# Patient Record
Sex: Female | Born: 1980 | Hispanic: No | Marital: Married | Smoking: Former smoker
Health system: Southern US, Academic
[De-identification: ages and names within clinical notes are randomized; demographics above are authoritative.]

## PROBLEM LIST (undated history)

## (undated) DIAGNOSIS — Z87898 Personal history of other specified conditions: Secondary | ICD-10-CM

## (undated) DIAGNOSIS — G43909 Migraine, unspecified, not intractable, without status migrainosus: Secondary | ICD-10-CM

## (undated) DIAGNOSIS — S3992XA Unspecified injury of lower back, initial encounter: Secondary | ICD-10-CM

## (undated) HISTORY — DX: Unspecified injury of lower back, initial encounter: S39.92XA

## (undated) HISTORY — DX: Personal history of other specified conditions: Z87.898

## (undated) HISTORY — DX: Migraine, unspecified, not intractable, without status migrainosus: G43.909

## (undated) HISTORY — PX: HX COLPOSCOPY: SHX161

---

## 2007-01-08 HISTORY — PX: HX ELECTIVE ABORTION: 2100001500

## 2013-01-07 DIAGNOSIS — L400 Psoriasis vulgaris: Secondary | ICD-10-CM

## 2013-01-07 HISTORY — DX: Psoriasis vulgaris: L40.0

## 2014-12-05 ENCOUNTER — Ambulatory Visit (INDEPENDENT_AMBULATORY_CARE_PROVIDER_SITE_OTHER): Payer: BC Managed Care – PPO

## 2014-12-05 ENCOUNTER — Encounter (INDEPENDENT_AMBULATORY_CARE_PROVIDER_SITE_OTHER): Payer: Self-pay

## 2014-12-05 VITALS — BP 109/68 | HR 70 | Temp 98.4°F | Resp 14 | Ht 62.0 in | Wt 119.7 lb

## 2014-12-05 DIAGNOSIS — L409 Psoriasis, unspecified: Secondary | ICD-10-CM

## 2014-12-05 DIAGNOSIS — L4050 Arthropathic psoriasis, unspecified: Secondary | ICD-10-CM

## 2014-12-05 MED ORDER — PREDNISONE 20 MG TABLET
ORAL_TABLET | ORAL | Status: DC
Start: 2014-12-05 — End: 2015-01-23

## 2014-12-05 NOTE — Patient Instructions (Signed)
Psoriasis  Psoriasis is a common, long-lasting (chronic) inflammation of the skin. It affects both men and women equally, of all ages and all races. Psoriasis cannot be passed from person to person (not contagious). Psoriasis varies from mild to very severe. When severe, it can greatly affect your quality of life. Psoriasis is an inflammatory disorder affecting the skin as well as other organs including the joints (causing an arthritis). With psoriasis, the skin sheds its top layer of cells more rapidly than it does in someone without psoriasis.  CAUSES   The cause of psoriasis is largely unknown. Genetics, your immune system, and the environment seem to play a role in causing psoriasis. Factors that can make psoriasis worse include:   Damage or trauma to the skin, such as cuts, scrapes, and sunburn. This damage often causes new areas of psoriasis (lesions).   Winter dryness and lack of sunlight.   Medicines such as lithium, beta-blockers, antimalarial drugs, ACE inhibitors, nonsteroidal anti-inflammatory drugs (ibuprofen, aspirin), and terbinafine. Let your caregiver know if you are taking any of these drugs.   Alcohol. Excessive alcohol use should be avoided if you have psoriasis. Drinking large amounts of alcohol can affect:    How well your psoriasis treatment works.    How safe your psoriasis treatment is.   Smoking. If you smoke, ask your caregiver for help to quit.   Stress.   Bacterial or viral infections.   Arthritis. Arthritis associated with psoriasis (psoriatic arthritis) affects less than 10% of patients with psoriasis. The arthritic intensity does not always match the skin psoriasis intensity. It is important to let your caregiver know if your joints hurt or if they are stiff.  SYMPTOMS   The most common form of psoriasis begins with little red bumps that gradually become larger. The bumps begin to form scales that flake off easily. The lower layers of scales stick together. When these scales  are scratched or removed, the underlying skin is tender and bleeds easily. These areas then grow in size and may become large. Psoriasis often creates a rash that looks the same on both sides of the body (symmetrical). It often affects the elbows, knees, groin, genitals, arms, legs, scalp, and nails. Affected nails often have pitting, loosen, thicken, crumble, and are difficult to treat.   "Inverse psoriasis"occurs in the armpits, under breasts, in skin folds, and around the groin, buttocks, and genitals.   "Guttate psoriasis" generally occurs in children and young adults following a recent sore throat (strep throat). It begins with many small, red, scaly spots on the skin. It clears spontaneously in weeks or a few months without treatment.  DIAGNOSIS   Psoriasis is diagnosed by physical exam. A tissue sample (biopsy) may also be taken.  TREATMENT  The treatment of psoriasis depends on your age, health, and living conditions.   Steroid (cortisone) creams, lotions, and ointments may be used. These treatments are associated with thinning of the skin, blood vessels that get larger (dilated), loss of skin pigmentation, and easy bruising. It is important to use these steroids as directed by your caregiver. Only treat the affected areas and not the normal, unaffected skin. People on long-term steroid treatment should wear a medical alert bracelet. Injections may be used in areas that are difficult to treat.   Scalp treatments are available as shampoos, solutions, sprays, foams, and oils. Avoid scratching the scalp and picking at the scales.   Anthralin medicine works well on areas that are difficult to treat. However, it  stains clothes and skin and may cause temporary irritation.   Synthetic vitamin D (calcipotriene)can be used on small areas. It is available by prescription. The forms of synthetic vitamin D available in health food stores do not help with psoriasis.   Coal tarsare available in various strengths  for psoriasis that is difficult to treat. They are one of the longest used treatments for difficult to treat psoriasis. However, they are messy to use.   Light therapy (UV therapy) can be carefully and professionally monitored in a dermatologist's office. Careful sunbathing is helpful for many people as directed by your caregiver. The exposure should be just long enough to cause a mild redness (erythema) of your skin. Avoid sunburn as this may make the condition worse. Sunscreen (SPF of 30 or higher) should be used to protect against sunburn. Cataracts, wrinkles, and skin aging are some of the harmful side effects of light therapy.   If creams (topical medicines) fail, there are several other options for systemic or oral medicines your caregiver can suggest.  Psoriasis can sometimes be very difficult to treat. It can come and go. It is necessary to follow up with your caregiver regularly if your psoriasis is difficult to treat. Usually, with persistence you can get a good amount of relief. Maintaining consistent care is important. Do not change caregivers just because you do not see immediate results. It may take several trials to find the right combination of treatment for you.  PREVENTING FLARE-UPS   Wear gloves while you wash dishes, while cleaning, and when you are outside in the cold.   If you have radiators, place a bowl of water or damp towel on the radiator. This will help put water back in the air. You can also use a humidifier to keep the air moist. Try to keep the humidity at about 60% in your home.   Apply moisturizer while your skin is still damp from bathing or showering. This traps water in the skin.   Avoid long, hot baths or showers. Keep soap use to a minimum. Soaps dry out the skin and wash away the protective oils. Use a fragrance free, dye free soap.   Drink enough water and fluids to keep your urine clear or pale yellow. Not drinking enough water depletes your skin's water  supply.   Turn off the heat at night and keep it low during the day. Cool air is less drying.  SEEK MEDICAL CARE IF:   You have increasing pain in the affected areas.   You have uncontrolled bleeding in the affected areas.   You have increasing redness or warmth in the affected areas.   You start to have pain or stiffness in your joints.   You start feeling depressed about your condition.   You have a fever.     This information is not intended to replace advice given to you by your health care provider. Make sure you discuss any questions you have with your health care provider.     Document Released: 12/22/1999 Document Revised: 03/18/2011 Document Reviewed: 06/18/2010  Elsevier Interactive Patient Education 2016 [employer].       [facility]      Operated by [facility]  [address] [place], [place] [address]  Phone: [phone])  Fax: [phone]  [email or web address]  Open Daily 8:00a - 8:00p    Closed Thanksgiving and Christmas Day  [facility]     Operated by [facility]  [address]  [facility]  [place], [place] [id number]  Phone: [phone]  Fax: [phone]  [email or web address]  Open M-F  8:00a - 8:00p  Sat              10:00a - 4:00p  Sun             Closed  Closed all [place] Holidays        Attending Caregiver: Darliss Ridgelodd Owen Hunter Pinkard, MD      Today's orders:   Orders Placed This Encounter    predniSONE (DELTASONE) 20 mg Oral Tablet        Prescription(s) E-Rx to:  [facility] - [employer], [place] - [address]    ________________________________________________________________________  Short Term Disability and Family Medical Leave Act   Urgent Care does NOT provide assistance with any disability applications.  If you feel your medical condition requires you to be on disability, you will need to follow up with  your primary  care physician or a specialist.  We apologize for any inconvenience.    For Medication Prescribed by [facility]:  As an Urgent Care facility, our clinic does NOT offer prescription refills over the telephone.    If you need more of the medication one of our medical providers prescribed, you will  either need to be re-evaluated by us or see your primary care physician.    ________________________________________________________________________      It is very important that we have a phone number.  This is the single best way to contact you in the event that we become aware of important clinical information or concerns after your discharge.  If the phone number you provided at registration is NOT this number you should inform staff and registration prior to leaving.      Your treatment and evaluation today was focused on identifying and treating potentially emergent conditions based on your presenting signs, symptoms, and history.  The resulting initial clinical impression and treatment plan is not intended to be definitive or a substitute for a full physical examination and evaluation by your primary care provider.  If your symptoms persist, worsen, or you develop any new or concerning symptoms, you need to be evaluated.      If you received x-rays during your visit, be aware that the final and formal interpretation of those films by a radiologist may occur after your discharge.  If there is a significant discrepancy identified after your discharge, we will contact you at the telephone number provided at registration.      If you received a pelvic exam, you may have cultures pending for sexually transmitted diseases.  Positive cultures are reported to the [facility] Department of Health as required by state law.  You may contact the Health Information Management Office of [facility] to get a copy of your results.     If you are over 32 year old, we cannot discuss your personal health information with a  parent, spouse, family member, or anyone else without your consent.  This does not include those who have legitimate access to your records and information to assist in your care under the provisions of HIPAA [facility](Health Insurance Portability and Accountability Act) law, or those to whom you have previously given written consent to do so, such a legal guardian or Power of [place].      Instructions are discussed with patient upon discharge by clinical  staff with all questions answered.  Please call [facility] [phone]) if any further questions develop.  Go immediately to the emergency department if any concerns or worsening symptoms.      Darliss Ridgel, MD 12/05/2014, 17:54

## 2014-12-05 NOTE — Progress Notes (Addendum)
History of Present Illness: Joyce Hunt is a 34 y.o. 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   Pt presents to UC with c/o of L leg pain and swelling secondary to plaque psoriasis onset today. The pt reports that this feels similar to prior flare ups after being off medication. She sts that she is on Stelara and has been receiving injections every few months which helps, but she is 4-5 months behind on those. The pt associates L knee swelling and mild tenderness. She reports that she currently has some lesions on her L lower leg.     Location: L lower leg  Quality: Psoriasis flare up   Onset: Today   Severity: Moderate  Timing: Constant  Context: Pt sts her joint pain feels similar to prior psoriasis flares.   Modifying factors: She has not done anything.   Associated symptoms: L knee tenderness, swelling, lesions   Denials: none    I reviewed and confirmed the patient's past medical history taken by the nurse or medical assistant with the addition of the following:    Past Medical History:    Past Medical History   Diagnosis Date    Psoriasis          Past Surgical History:    History reviewed. No pertinent past surgical history.      Allergies:  No Known Allergies  Medications:    Current Outpatient Prescriptions   Medication Sig    predniSONE (DELTASONE) 20 mg Oral Tablet 3 tabs po daily days 1-2, 2 tabs days 3-5, 1 tab days 5-6, 1/2 tab days 7-10    USTEKINUMAB (STELARA SUBQ) by Subcutaneous route     Social History:    Social History   Substance Use Topics    Smoking status: Never Smoker     Smokeless tobacco: None    Alcohol Use: Yes     Family History: No significant family history.  Family History   Problem Relation Age of Onset    No Known Problems Mother     No Known Problems Father     Psoriasis Maternal Uncle      Review of Systems:    Musculoskeletal:  L knee pain, swelling,  lesions  All other review of systems were negative    Physical Exam:  Vital signs:   Filed Vitals:    12/05/14 1731   BP: 109/68   Pulse: 70   Temp: 36.9 C (98.4 F)   TempSrc: Tympanic   Resp: 14   Height: 1.575 m (5\' 2" )   Weight: 54.3 kg (119 lb 11.4 oz)   SpO2: 100%     Body mass index is 21.89 kg/(m^2). Normalized weight-for-age data available only for age 69 to 20 years.  Patient's last menstrual period was 11/23/2014.    General:  No acute distress  Head:  Normocephalic and atraumatic   Eyes:  Normal lids/lashes, EOMI and normal conjunctiva  Pulmonary:  Equal breath sounds, no wheezes, no rales and no rhonchi   Cardiovascular:  Regular rate/rhythm without murmur, normal radial pulses without peripheral edema    Musculoskeletal: Mild edema of L knee, poorly localized tenderness to posterior and lateral joint line, and increased pain with full flexion   Skin:  Multiple scaly, erythematous plaques on L lower extremity, no  calf tenderness or swelling  Psychiatric:  Appropriate affect and behavior  Neurologic:   Alert and oriented x 3    Data Reviewed:    Not applicable    Course: Condition at discharge: Good     Differential Diagnosis: psoriasis vs psoriatic arthritis vs cellulitis     Assessment:   1. Psoriasis    2. Psoriatic arthritis (HCC)        Plan:    Orders Placed This Encounter    predniSONE (DELTASONE) 20 mg Oral Tablet          Pt educated on steroid use and supportive care.   Advised to establish a PCP and follow up with them for further care.   Pt should follow up with rheumatology as well, is awaiting referral appt     Go to Emergency Department immediately for further work up if any concerning symptoms.  Plan was discussed and patient verbalized understanding.  If symptoms are worsening or not improving the patient should return to the Urgent Care for further evaluation.    I am scribing for, and in the presence of, Dr.Orra Nolde for services provided on 12/05/2014.  Olin Pia, SCRIBE     [place], SCRIBE 12/05/2014, 17:55    I personally performed the services described in this documentation, as scribed  in my presence, and it is both accurate  and complete.    Darliss Ridgel, MD

## 2014-12-15 ENCOUNTER — Ambulatory Visit (INDEPENDENT_AMBULATORY_CARE_PROVIDER_SITE_OTHER): Payer: BC Managed Care – PPO | Admitting: Internal Medicine

## 2014-12-19 ENCOUNTER — Encounter (INDEPENDENT_AMBULATORY_CARE_PROVIDER_SITE_OTHER): Payer: Self-pay | Admitting: Internal Medicine

## 2014-12-19 ENCOUNTER — Encounter (FREE_STANDING_LABORATORY_FACILITY)
Admit: 2014-12-19 | Discharge: 2014-12-19 | Disposition: A | Discharge status: Home / Self Care | Payer: BC Managed Care – PPO | Attending: Internal Medicine | Admitting: Internal Medicine

## 2014-12-19 ENCOUNTER — Ambulatory Visit (INDEPENDENT_AMBULATORY_CARE_PROVIDER_SITE_OTHER): Payer: BC Managed Care – PPO | Admitting: Internal Medicine

## 2014-12-19 VITALS — BP 108/66 | HR 90 | Temp 97.4°F | Resp 18 | Ht 68.0 in | Wt 117.7 lb

## 2014-12-19 DIAGNOSIS — L400 Psoriasis vulgaris: Secondary | ICD-10-CM | POA: Insufficient documentation

## 2014-12-19 DIAGNOSIS — M2550 Pain in unspecified joint: Secondary | ICD-10-CM

## 2014-12-19 DIAGNOSIS — M791 Myalgia, unspecified site: Secondary | ICD-10-CM

## 2014-12-19 DIAGNOSIS — R739 Hyperglycemia, unspecified: Secondary | ICD-10-CM

## 2014-12-19 LAB — CBC WITH DIFF
BASOPHIL #: 0.05 x10ˆ3/uL (ref 0.00–0.20)
BASOPHIL %: 0 %
EOSINOPHIL #: 0.16 x10ˆ3/uL (ref 0.00–0.50)
EOSINOPHIL %: 1 %
HCT: 38.6 % (ref 33.5–45.2)
HGB: 13.1 g/dL (ref 11.2–15.2)
LYMPHOCYTE #: 3.35 x10?3/uL (ref 1.00–4.80)
LYMPHOCYTE %: 28 %
MCH: 30.1 pg (ref 27.4–33.0)
MCHC: 33.8 g/dL (ref 32.5–35.8)
MCV: 89 fL (ref 78.0–100.0)
MONOCYTE #: 0.58 x10ˆ3/uL (ref 0.30–1.00)
MONOCYTE %: 5 %
MPV: 8.5 fL (ref 7.5–11.5)
NEUTROPHIL #: 7.69 x10?3/uL (ref 1.50–7.70)
NEUTROPHIL %: 65 %
NEUTROPHIL %: 65 %
PLATELETS: 266 x10ˆ3/uL (ref 140–450)
RBC: 4.34 x10ˆ6/uL (ref 3.63–4.92)
RDW: 12.4 % (ref 12.0–15.0)
WBC: 11.8 x10ˆ3/uL — ABNORMAL HIGH (ref 3.5–11.0)

## 2014-12-19 LAB — HEPATIC FUNCTION PANEL
ALBUMIN: 3.7 g/dL (ref 3.5–5.0)
ALKALINE PHOSPHATASE: 97 U/L (ref <150)
ALT (SGPT): 135 U/L — ABNORMAL HIGH (ref <55)
AST (SGOT): 50 U/L — ABNORMAL HIGH (ref 8–41)
BILIRUBIN DIRECT: 0.1 mg/dL (ref <=0.3)
BILIRUBIN TOTAL: 0.2 mg/dL — ABNORMAL LOW (ref 0.3–1.3)
PROTEIN TOTAL: 7.8 g/dL (ref 6.4–8.3)

## 2014-12-19 LAB — BASIC METABOLIC PANEL
ANION GAP: 10 mmol/L (ref 4–13)
BUN/CREA RATIO: 21 (ref 6–22)
BUN: 18 mg/dL (ref 8–25)
CALCIUM: 9.3 mg/dL (ref 8.5–10.4)
CHLORIDE: 106 mmol/L (ref 96–111)
CO2 TOTAL: 22 mmol/L (ref 22–32)
CREATININE: 0.84 mg/dL (ref 0.49–1.10)
ESTIMATED GFR: >59 mL/min/1.73mˆ2 (ref >59)
GLUCOSE: 176 mg/dL — ABNORMAL HIGH (ref 65–139)
POTASSIUM: 3.7 mmol/L (ref 3.5–5.1)
SODIUM: 138 mmol/L (ref 136–145)

## 2014-12-19 LAB — THYROID STIMULATING HORMONE WITH FREE T4 REFLEX: TSH: 3.725 u[IU]/mL (ref 0.350–5.000)

## 2014-12-19 LAB — C-REACTIVE PROTEIN(CRP),INFLAMMATION: CRP INFLAMMATION: 2.4 mg/L (ref <=8.0)

## 2014-12-19 MED ORDER — BETAMETHASONE VALERATE 0.1 % TOPICAL OINTMENT
TOPICAL_OINTMENT | Freq: Two times a day (BID) | CUTANEOUS | 2 refills | Status: DC
Start: 2014-12-19 — End: 2015-12-03

## 2014-12-19 NOTE — Progress Notes (Signed)
Venipuncture performed in office on left arm antecubital vein, dry pressure dressing was applied to site and patient tolerated it well.  Garrel RidgelAshley Cooper, MA  12/19/2014, 11:23

## 2014-12-19 NOTE — H&P (Signed)
[facility] Internal Medicine and Pediatrics    Chief Complaint   Patient presents with    New Patient     Joyce Hunt       HPI: 34 y.o. female presents to establish care.  Has severe plaque psoriasis. Is on stelara, has been on for 2 years, has patient assistance through [place] and [place]. Due for shot, has had return of symptoms: rash and leg pain/muscles joints.    Was tolerating stelara well, no fevers, chills, no cough.                   Past Medical History   Diagnosis Date    Psoriasis            Past Surgical History   Procedure Laterality Date    Hx no surgical procedures               Outpatient Medications Prior to Visit:  predniSONE (DELTASONE) 20 mg Oral Tablet 3 tabs po daily days 1-2, 2 tabs days 3-5, 1 tab days 5-6, 1/2 tab days 7-10   USTEKINUMAB (STELARA SUBQ) by Subcutaneous route     No facility-administered medications prior to visit.     No Known Allergies    Family History   Problem Relation Age of Onset    No Known Problems Mother     No Known Problems Father     Psoriasis Maternal Uncle            Social History     Social History    Marital status: Single     Spouse name: N/A    Number of children: N/A    Years of education: N/A     Occupational History    Not on file.     Social History Main Topics    Smoking status: Never Smoker    Smokeless tobacco: Not on file    Alcohol use Yes    Drug use: Not on file    Sexual activity: Not on file     Other Topics Concern    Not on file     Social History Narrative    No narrative on file       Review of Systems:  General: negative for fevers, chills, night sweats  Skin: negative for rash, itching, change in mole, new mole, hair/nail changes   HEENT: having dental issues: wisdom tooth impacted  Pulmonary: negative for cough,   Cardiovascular: negative for chest pain, palpitations, edema  GI: negative for appetite change, dysphagia, indigestion, nausea, vomiting,change in bowel habits, abdominal pain, constipation,  diarrhea, blood per rectum, melena  GU: has cramps with ovulation  Endocrine: negative for goiter/thyroid problems, heat/cold intolerance, polydipsia, polyphagia, polyuria   Immune/heme: negative for easy bruising, lymph node enlargement, history of blood clots   MS: periodic leg/knee swelling  Neuro: negative for headaches, dizziness, weakness, occasional numbness/tingling in legs  Psych: negative for irregular sleep, anxiety, depression, drug or alcohol problems,SI or HI    Physical Exam  Vitals:    12/19/14 1038   BP: 108/66   Pulse: 90   Resp: 18   Temp: 36.3 C (97.4 F)   TempSrc: Thermal Scan   SpO2: 98%   Weight: 53.4 kg (117 lb 11.6 oz)   Height: 1.727 m ( )     Body mass index is 17.9 kg/(m^2).    General: appears in good health, appears stated age, no acute distress and non-toxic  Eyes: conjunctiva clear., pupils equal  and round. , sclera non-icteric. EOM  HENT: ENT without erythema or injection, mucous membranes moist.  Neck: No JVD or thyromegaly or lymphadenopathy, neck supple, symmetrical, trachea midline  Lungs: clear to auscultation bilaterally,  no use of accessory muscles, no adventitious sounds.  Cardiovascular: heart regular rate and rhythm without murmer, no edema  Abdomen: soft, non-tender, bowel sounds normal, non-distended and no hepatosplenomegaly, no CVA tenderness  Extremities: no cyanosis, clubbing, or edema   MSK: spine straight, no tenderness over spinous processes, paraspinal muscles, no joint swelling, redness, or warmth  Skin: Skin warm and psoriatic plaques bilateral lower extremities, arms  Neurologic: CN II - XII grossly intact , alert and oriented x3, no tremor, sensory: intact to light touch  Lymphatics: cervical adenopathy: none   Psychiatric: normal affect, behavior, memory, thought content, judgement, and speech.      Assessment and Plan:    Lilian Kapurriya was seen today for new patient.    Diagnoses and all orders for this visit:    Joint pain  -     [facility];  Future  -     Cbc/Diff; Future  -     C-Reactive Protein (Inflammation); Future  -     Basic Metabolic Panel; Future  -     Hepatic Function Panel; Future  -     Tsh, Cascade; Future  -     betamethasone valerate (VALISONE) 0.1 % Ointment; Apply topically Twice daily    Plaque psoriasis  -     [facility]; Future  -     Cbc/Diff; Future  -     C-Reactive Protein (Inflammation); Future  -     Basic Metabolic Panel; Future  -     Hepatic Function Panel; Future  -     Tsh, Cascade; Future  -     betamethasone valerate (VALISONE) 0.1 % Ointment; Apply topically Twice daily    Muscle pain  -     [facility]; Future  -     Cbc/Diff; Future  -     C-Reactive Protein (Inflammation); Future  -     Basic Metabolic Panel; Future  -     Hepatic Function Panel; Future  -     Tsh, Cascade; Future  -     betamethasone valerate (VALISONE) 0.1 % Ointment; Apply topically Twice daily      Marshall CorkKristen Nicole Lauraann Missey, MD  Internal Medicine and Pediatrics  Assistant Professor [place]    [facility]  [address], [address]  [place], [place] [id number]    Flint MelterKristen Glenette Bookwalter, MD  12/19/2014, 10:46

## 2014-12-20 NOTE — Progress Notes (Signed)
Glucose high, was on steroids, will repeat fasting glucose in 1 month

## 2014-12-20 NOTE — Telephone Encounter (Signed)
-----   Message from [place] Lakes[place]riya Jaworski sent at 12/19/2014 11:31 PM EST -----  Regarding: Test Results Question  Contact: [phone]  Good Evening Dr. Christell ConstantMoore:    Just wanted to let you know that i received the blood results via my chart and just wanted to see if you can go over them with me and let me know what they mean and if there is anything i need to pursue and so forth. It was really nice meeting you today. I hope to hear from you soon. I am off work until Thursday, so i will be free until then. Thank you so much and also did you make any appointment for the dermatologist? i didn't see anything in my chart for that. Thanks again.    [name]-Sharlyne Ngo  [phone]

## 2014-12-20 NOTE — Telephone Encounter (Signed)
Regarding: Test Results Question  ----- Message from Garrel RidgelAshley Cooper, MA sent at 12/20/2014  9:44 AM EST -----       ----- Message from Joyce PerchesPriya Souffrant to Flint MelterMoore, Lezette Kitts, MD sent at 12/19/2014 11:31 PM -----   Good Evening Dr. Christell ConstantMoore:    Just wanted to let you know that i received the blood results via my chart and just wanted to see if you can go over them with me and let me know what they mean and if there is anything i need to pursue and so forth. It was really nice meeting you today. I hope to hear from you soon. I am off work until Thursday, so i will be free until then. Thank you so much and also did you make any appointment for the dermatologist? i didn't see anything in my chart for that. Thanks again.    Joyce Hunt-Joyce Hunt  [phone]

## 2015-01-23 ENCOUNTER — Ambulatory Visit (HOSPITAL_BASED_OUTPATIENT_CLINIC_OR_DEPARTMENT_OTHER): Payer: BC Managed Care – PPO

## 2015-01-23 ENCOUNTER — Ambulatory Visit: Payer: BC Managed Care – PPO | Attending: DERMATOLOGY | Admitting: DERMATOLOGY

## 2015-01-23 ENCOUNTER — Encounter (HOSPITAL_BASED_OUTPATIENT_CLINIC_OR_DEPARTMENT_OTHER): Payer: Self-pay | Admitting: DERMATOLOGY

## 2015-01-23 DIAGNOSIS — L400 Psoriasis vulgaris: Secondary | ICD-10-CM

## 2015-01-23 DIAGNOSIS — M2550 Pain in unspecified joint: Secondary | ICD-10-CM

## 2015-01-23 DIAGNOSIS — M791 Myalgia, unspecified site: Secondary | ICD-10-CM

## 2015-01-23 MED ORDER — USTEKINUMAB 45 MG/0.5 ML SUBCUTANEOUS SYRINGE
45.0000 mg | INJECTION | Freq: Once | SUBCUTANEOUS | 0 refills | Status: AC
Start: 2015-01-23 — End: 2015-01-23

## 2015-01-23 NOTE — Progress Notes (Signed)
Dermatology Clinic, [facility]  [address]  [place] [id number]  [phone]    Date:   01/23/2015  Name: Joyce Hunt  Age: 35 y.o.    Chief complaint: Skin Check (New patient, c/o: psoriasis )    HPI  Here for follow up of psoriasis.  Present for years.  Legs worse.  Dx with biopsy.  Started ustikiumab 2 years ago.  Cleared on.  Has not had any for about 7 months due to moving.  Using topicals.  Has lesions lower legs.  No personal or family hx skin cancer.  Careful in the sun.    Review of Systems   Constitutional: Negative for fever.   Cardiovascular: Negative for chest pain.   Gastrointestinal: Negative for diarrhea.     Current Medications   betamethasone valerate (VALISONE) 0.1 % Ointment Apply topically Twice daily    USTEKINUMAB (STELARA SUBQ) by Subcutaneous route    ustekinumab (STELARA) 45 mg/0.5 mL Subcutaneous Syringe 0.5 mL (45 mg total) by Subcutaneous route One time for 1 dose     Allergies   Allergen Reactions    Mushrooms [Mushroom Flavor] Swelling     itchy     Past Medical History   Diagnosis Date    Plaque psoriasis 12/19/2014    Psoriasis          Physical Exam  Vitals: Blood pressure 103/69, height 1.553 m (5' 1.14"), weight 55.1 kg (121 lb 7.6 oz).  Physical Exam   Constitutional: She is oriented to person, place, and time. She appears well-developed and well-nourished. No distress.       HENT:   Head: Normocephalic and atraumatic.   Neurological: She is alert and oriented to person, place, and time.   Skin: Skin is warm and dry. She is not diaphoretic.   Psychiatric: She has a normal mood and affect.     Assessment and Plan  Problem List Items Addressed This Visit     Plaque psoriasis      Other Visit Diagnoses     Joint pain        Muscle pain               Plan    Restart ustekinumab 45 mg q 4 months    Quantiferon today  Follow  The patient was educated on the importance of avoiding excessive sun exposure and wearing sunscreen daily.  Advised  patient to re-apply sunscreen every 2-3 hours.  Advised the patient to avoid going to the tanning beds.  Advised to check skin routinely for any changes, especially any new moles or changes in existing moles.    Miles Costainoxann L Tracey Hermance, MD

## 2015-01-25 ENCOUNTER — Encounter (HOSPITAL_BASED_OUTPATIENT_CLINIC_OR_DEPARTMENT_OTHER): Payer: BC Managed Care – PPO | Admitting: DERMATOLOGY

## 2015-01-25 LAB — MYCOBACTERIUM TUBERCULOSIS INFECTION DETERMINATION BY QUANTIFERON-TB G
MITOGEN MINUS NIL RESULT: >10 [IU]/mL
NIL RESULT: 0.04 [IU]/mL
QUANTIFERON-TB GOLD RESULT: NEGATIVE
TB AG MINUS NIL RESULT: 0 [IU]/mL

## 2015-01-30 ENCOUNTER — Other Ambulatory Visit (HOSPITAL_BASED_OUTPATIENT_CLINIC_OR_DEPARTMENT_OTHER): Payer: Self-pay | Admitting: DERMATOLOGY

## 2015-01-30 DIAGNOSIS — L400 Psoriasis vulgaris: Secondary | ICD-10-CM

## 2015-01-30 MED ORDER — USTEKINUMAB 45 MG/0.5 ML SUBCUTANEOUS SYRINGE
45.0000 mg | INJECTION | Freq: Once | SUBCUTANEOUS | 0 refills | Status: AC
Start: 2015-01-30 — End: 2015-01-30

## 2015-01-31 ENCOUNTER — Ambulatory Visit (INDEPENDENT_AMBULATORY_CARE_PROVIDER_SITE_OTHER): Payer: BC Managed Care – PPO

## 2015-01-31 ENCOUNTER — Other Ambulatory Visit (INDEPENDENT_AMBULATORY_CARE_PROVIDER_SITE_OTHER): Payer: Self-pay | Admitting: Pharmacist

## 2015-01-31 ENCOUNTER — Other Ambulatory Visit (HOSPITAL_BASED_OUTPATIENT_CLINIC_OR_DEPARTMENT_OTHER): Payer: Self-pay | Admitting: DERMATOLOGY

## 2015-01-31 ENCOUNTER — Encounter (INDEPENDENT_AMBULATORY_CARE_PROVIDER_SITE_OTHER): Payer: Self-pay

## 2015-01-31 VITALS — BP 103/67 | HR 75 | Temp 97.7°F | Resp 16 | Ht 61.0 in | Wt 121.3 lb

## 2015-01-31 DIAGNOSIS — W19XXXA Unspecified fall, initial encounter: Principal | ICD-10-CM

## 2015-01-31 DIAGNOSIS — S8991XA Unspecified injury of right lower leg, initial encounter: Secondary | ICD-10-CM

## 2015-01-31 DIAGNOSIS — L400 Psoriasis vulgaris: Secondary | ICD-10-CM

## 2015-01-31 DIAGNOSIS — M25561 Pain in right knee: Secondary | ICD-10-CM

## 2015-01-31 DIAGNOSIS — M79641 Pain in right hand: Secondary | ICD-10-CM

## 2015-01-31 DIAGNOSIS — W1781XA Fall down embankment (hill), initial encounter: Secondary | ICD-10-CM

## 2015-01-31 MED ORDER — IBUPROFEN 600 MG TABLET
600.00 mg | ORAL_TABLET | Freq: Three times a day (TID) | ORAL | 0 refills | Status: DC
Start: 2015-01-31 — End: 2015-04-21

## 2015-01-31 NOTE — Patient Instructions (Signed)
Knee Pain  Knee pain is a very common symptom and can have many causes. Knee pain often goes away when you follow your health care provider's instructions for relieving pain and discomfort at home. However, knee pain can develop into a condition that needs treatment. Some conditions may include:   Arthritis caused by wear and tear (osteoarthritis).   Arthritis caused by swelling and irritation (rheumatoid arthritis or gout).   A cyst or growth in your knee.   An infection in your knee joint.   An injury that will not heal.   Damage, swelling, or irritation of the tissues that support your knee (torn ligaments or tendinitis).  If your knee pain continues, additional tests may be ordered to diagnose your condition. Tests may include X-rays or other imaging studies of your knee. You may also need to have fluid removed from your knee. Treatment for ongoing knee pain depends on the cause, but treatment may include:   Medicines to relieve pain or swelling.   Steroid injections in your knee.   Physical therapy.   Surgery.  HOME CARE INSTRUCTIONS   Take medicines only as directed by your health care provider.   Rest your knee and keep it raised (elevated) while you are resting.   Do not do things that cause or worsen pain.   Avoid high-impact activities or exercises, such as running, jumping rope, or doing jumping jacks.   Apply ice to the knee area:    Put ice in a plastic bag.    Place a towel between your skin and the bag.    Leave the ice on for 20 minutes, 2-3 times a day.   Ask your health care provider if you should wear an elastic knee support.   Keep a pillow under your knee when you sleep.   Lose weight if you are overweight. Extra weight can put pressure on your knee.   Do not use any tobacco products, including cigarettes, chewing tobacco, or electronic cigarettes. If you need help quitting, ask your health care provider. Smoking may slow the healing of any bone and joint problems that you may  have.  SEEK MEDICAL CARE IF:   Your knee pain continues, changes, or gets worse.   You have a fever along with knee pain.   Your knee buckles or locks up.   Your knee becomes more swollen.  SEEK IMMEDIATE MEDICAL CARE IF:    Your knee joint feels hot to the touch.   You have chest pain or trouble breathing.     This information is not intended to replace advice given to you by your health care provider. Make sure you discuss any questions you have with your health care provider.     Document Released: 10/21/2006 Document Revised: 01/14/2014 Document Reviewed: 08/09/2013  Elsevier Interactive Patient Education 2016 Elsevier Inc.    RICE for Routine Care of Injuries  Theroutine careofmanyinjuriesincludes rest, ice, compression, and elevation (RICE). The RICE strategy is often recommended for injuries to soft tissues, such as a muscle strain, ligament injuries, bruises, and overuse injuries. It can also be used for some bony injuries. Using the RICE strategy can help to relieve pain, lessen swelling, and enable your body to heal.  Rest  Rest is required to allow your body to heal. This usually involves reducing your normal activities and avoiding use of the injured part of your body. Generally, you can return to your normal activities when you are comfortable and have been given permission  by your health care provider.  Ice  Icing your injury helps to keep the swelling down, and it lessens pain. Do not apply ice directly to your skin.   Put ice in a plastic bag.   Place a towel between your skin and the bag.   Leave the ice on for 20 minutes, 2-3 times a day.  Do this for as long as you are directed by your health care provider.  Compression  Compression means putting pressure on the injured area. Compression helps to keep swelling down, gives support, and helps with discomfort. Compression may be done with an elastic bandage. If an elastic bandage has been applied, follow these general tips:   Remove  and reapply the bandage every 3-4 hours or as directed by your health care provider.   Make sure the bandage is not wrapped too tightly, because this can cut off circulation. If part of your body beyond the bandage becomes blue, numb, cold, swollen, or more painful, your bandage is most likely too tight. If this occurs, remove your bandage and reapply it more loosely.   See your health care provider if the bandage seems to be making your problems worse rather than better.  Elevation  Elevation means keeping the injured area raised. This helps to lessen swelling and decrease pain. If possible, your injured area should be elevated at or above the level of your heart or the center of your chest.  WHEN SHOULD I SEEK MEDICAL CARE?  You should seek medical care if:   Your pain and swelling continue.   Your symptoms are getting worse rather than improving.  These symptoms may indicate that further evaluation or further X-rays are needed. Sometimes, X-rays may not show a small broken bone (fracture) until a number of days later. Make a follow-up appointment with your health care provider.  WHEN SHOULD I SEEK IMMEDIATE MEDICAL CARE?  You should seek immediate medical care if:   You have sudden severe pain at or below the area of your injury.   You have redness or increased swelling around your injury.   You have tingling or numbness at or below the area of your injury that does not improve after you remove the elastic bandage.     This information is not intended to replace advice given to you by your health care provider. Make sure you discuss any questions you have with your health care provider.     Document Released: 04/07/2000 Document Revised: 12/29/2012 Document Reviewed: 12/01/2013  Elsevier Interactive Patient Education 2016 Elsevier Inc.  Ibuprofen tablets and capsules  What is this medicine?  IBUPROFEN (eye BYOO proe fen) is a non-steroidal anti-inflammatory drug (NSAID). It is used for dental pain, fever,  headaches or migraines, osteoarthritis, rheumatoid arthritis, or painful monthly periods. It can also relieve minor aches and pains caused by a cold, flu, or sore throat.  This medicine may be used for other purposes; ask your health care provider or pharmacist if you have questions.  What should I tell my health care provider before I take this medicine?  They need to know if you have any of these conditions:  -asthma  -cigarette smoker  -drink more than 3 alcohol containing drinks a day  -heart disease or circulation problems such as heart failure or leg edema (fluid retention)  -high blood pressure  -kidney disease  -liver disease  -stomach bleeding or ulcers  -an unusual or allergic reaction to ibuprofen, aspirin, other NSAIDS, other medicines, foods, dyes,  or preservatives  -pregnant or trying to get pregnant  -breast-feeding  How should I use this medicine?  Take this medicine by mouth with a glass of water. Follow the directions on the prescription label. Take this medicine with food if your stomach gets upset. Try to not lie down for at least 10 minutes after you take the medicine. Take your medicine at regular intervals. Do not take your medicine more often than directed.  A special MedGuide will be given to you by the pharmacist with each prescription and refill. Be sure to read this information carefully each time.  Talk to your pediatrician regarding the use of this medicine in children. Special care may be needed.  Overdosage: If you think you have taken too much of this medicine contact a poison control center or emergency room at once.  NOTE: This medicine is only for you. Do not share this medicine with others.  What if I miss a dose?  If you miss a dose, take it as soon as you can. If it is almost time for your next dose, take only that dose. Do not take double or extra doses.  What may interact with this medicine?  Do not take this medicine with any of the following  medications:  -cidofovir  -ketorolac  -methotrexate  -pemetrexed  This medicine may also interact with the following medications:  -alcohol  -aspirin  -diuretics  -lithium  -other drugs for inflammation like prednisone  -warfarin  This list may not describe all possible interactions. Give your health care provider a list of all the medicines, herbs, non-prescription drugs, or dietary supplements you use. Also tell them if you smoke, drink alcohol, or use illegal drugs. Some items may interact with your medicine.  What should I watch for while using this medicine?  Tell your doctor or healthcare professional if your symptoms do not start to get better or if they get worse.  This medicine does not prevent heart attack or stroke. In fact, this medicine may increase the chance of a heart attack or stroke. The chance may increase with longer use of this medicine and in people who have heart disease. If you take aspirin to prevent heart attack or stroke, talk with your doctor or health care professional.  Do not take other medicines that contain aspirin, ibuprofen, or naproxen with this medicine. Side effects such as stomach upset, nausea, or ulcers may be more likely to occur. Many medicines available without a prescription should not be taken with this medicine.  This medicine can cause ulcers and bleeding in the stomach and intestines at any time during treatment. Ulcers and bleeding can happen without warning symptoms and can cause death. To reduce your risk, do not smoke cigarettes or drink alcohol while you are taking this medicine.  You may get drowsy or dizzy. Do not drive, use machinery, or do anything that needs mental alertness until you know how this medicine affects you. Do not stand or sit up quickly, especially if you are an older patient. This reduces the risk of dizzy or fainting spells.  This medicine can cause you to bleed more easily. Try to avoid damage to your teeth and gums when you brush or floss  your teeth.  This medicine may be used to treat migraines. If you take migraine medicines for 10 or more days a month, your migraines may get worse. Keep a diary of headache days and medicine use. Contact your healthcare professional if your migraine attacks  occur more frequently.  What side effects may I notice from receiving this medicine?  Side effects that you should report to your doctor or health care professional as soon as possible:  -allergic reactions like skin rash, itching or hives, swelling of the face, lips, or tongue  -severe stomach pain  -signs and symptoms of bleeding such as bloody or black, tarry stools; red or dark-brown urine; spitting up blood or brown material that looks like coffee grounds; red spots on the skin; unusual bruising or bleeding from the eye, gums, or nose  -signs and symptoms of a blood clot such as changes in vision; chest pain; severe, sudden headache; trouble speaking; sudden numbness or weakness of the face, arm, or leg  -unexplained weight gain or swelling  -unusually weak or tired  -yellowing of eyes or skin  Side effects that usually do not require medical attention (report to your doctor or health care professional if they continue or are bothersome):  -bruising  -diarrhea  -dizziness, drowsiness  -headache  -nausea, vomiting  This list may not describe all possible side effects. Call your doctor for medical advice about side effects. You may report side effects to FDA at 1-800-FDA-1088.  Where should I keep my medicine?  Keep out of the reach of children.  Store at room temperature between 15 and 30 degrees C (59 and 86 degrees F). Keep container tightly closed. Throw away any unused medicine after the expiration date.  NOTE: This sheet is a summary. It may not cover all possible information. If you have questions about this medicine, talk to your doctor, pharmacist, or health care provider.      2016, Elsevier/Gold Standard. (2012-08-25 10:48:02)     [facility]      Operated by [facility]  [address] [place], [place] [address]  Phone: [phone])  Fax: [phone]  [email or web address]  Open Daily 8:00a - 8:00p    Closed Thanksgiving and Christmas Day  [facility]     Operated by [facility]  [address]  [facility]  [place], [place] [address]  Phone: [phone]  Fax: [phone]  [email or web address]  Open M-F  8:00a - 8:00p  Sat              10:00a - 4:00p  Sun             Closed  Closed all [place] Holidays        Attending Caregiver: Unknown Jim Jacquel Redditt, APRN      Today's orders:   Orders Placed This Encounter    Thumb Spica Splint (Right)    Right Hand x-ray    Right Knee x-ray    Right Tibia-Fibula x-ray    AMB CONSULT/REFERRAL [facility]    Ibuprofen (MOTRIN) 600 mg Oral Tablet        Prescription(s) E-Rx to:  [facility] - [place], [place] - [address]    ________________________________________________________________________  Short Term Disability and Family Medical Leave Act  [facility] does NOT provide assistance with any disability applications.  If you feel your medical condition requires you to be on disability, you will need to follow up with  your primary care physician or a specialist.  We apologize for any inconvenience.    For Medication Prescribed by [facility]:  As an Urgent Care facility, our clinic does NOT offer prescription refills  over the telephone.    If you need more of the medication one of our medical providers prescribed, you will  either need to be re-evaluated by [place] or see your primary care physician.    ________________________________________________________________________      It is very important that we have a phone number.  This is the single best way to contact you in the event that we become aware of important clinical information  or concerns after your discharge.  If the phone number you provided at registration is NOT this number you should inform staff and registration prior to leaving.      Your treatment and evaluation today was focused on identifying and treating potentially emergent conditions based on your presenting signs, symptoms, and history.  The resulting initial clinical impression and treatment plan is not intended to be definitive or a substitute for a full physical examination and evaluation by your primary care provider.  If your symptoms persist, worsen, or you develop any new or concerning symptoms, you need to be evaluated.      If you received x-rays during your visit, be aware that the final and formal interpretation of those films by a radiologist may occur after your discharge.  If there is a significant discrepancy identified after your discharge, we will contact you at the telephone number provided at registration.      If you received a pelvic exam, you may have cultures pending for sexually transmitted diseases.  Positive cultures are reported to the [facility] Department of Health as required by state law.  You may contact the Health Information Management Office of [facility] to get a copy of your results.     If you are over 64 year old, we cannot discuss your personal health information with a parent, spouse, family member, or anyone else without your consent.  This does not include those who have legitimate access to your records and information to assist in your care under the provisions of HIPAA [facility] Portability and Accountability Act) law, or those to whom you have previously given written consent to do so, such a legal guardian or Power of [place].      Instructions are discussed with patient upon discharge by clinical staff with all questions answered.  Please call  Urgent Care [phone]) if any further questions develop.  Go immediately to the emergency department if any  concerns or worsening symptoms.      Unknown Jim Pangburn, APRN 01/31/2015, 16:02

## 2015-01-31 NOTE — Telephone Encounter (Signed)
Prior authorization request for drug Stelara received by [facility] on 01/31/2015. Benefits investigation to be completed. Thanks.

## 2015-01-31 NOTE — Progress Notes (Addendum)
Attending:Dr.Rogers  History of Present Illness: Joyce Hunt is a 35 y.o. female who presents to the Urgent Care today with chief complaint of    Chief Complaint     Knee Injury bilateral knees with right worse than left      .   Pt presents to UC with c/o of R knee pain secondary to a fall that occurred PTA. The pt reports that she was walking down a hill when she stepped on a manhole lid with her L foot and slipped and landed on her R knee and hand.     Location: R knee  Quality: Injury   Onset: PTA   Severity: Moderate  Timing: Constant  Context: Pt slipped on a manhole cover while walking down a hill.   Modifying factors: She has applied ice to the knee.   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    Psoriasis      Past Surgical History:    Past Surgical History   Procedure Laterality Date    Hx no surgical procedures       Allergies:  Allergies   Allergen Reactions    Mushrooms [Mushroom Flavor] Swelling     itchy     Medications:    Current Outpatient Prescriptions   Medication Sig    betamethasone valerate (VALISONE) 0.1 % Ointment Apply topically Twice daily    Ibuprofen (MOTRIN) 600 mg Oral Tablet Take 1 Tab (600 mg total) by mouth Every 8 hours     Social History:    Social History   Substance Use Topics    Smoking status: Never Smoker    Smokeless tobacco: None    Alcohol use Yes      Comment: 1-2 times per month     Family History: No significant family history.  Family History   Problem Relation Age of Onset    Diabetes Mother     Heart Disease Father      11    Psoriasis Maternal Uncle     Diabetes Maternal Aunt     Ovarian Cancer Maternal Grandmother     Breast Cancer Neg Hx      Review of Systems:    Musculoskeletal:  R knee pain and R hand  pain  Skin: edema and no ecchymosis   All other review of systems were negative    Physical Exam:  Vital signs:   Vitals:    01/31/15 1517   BP: 103/67   Pulse: 75   Resp: 16   Temp: 36.5 C (97.7 F)   TempSrc: Tympanic   SpO2: 100%   Weight: 55 kg (121 lb 4.1 oz)   Height: 1.549 m ( )     Body mass index is 22.91 kg/(m^2). Facility age limit for growth percentiles is 20 years.  Patient's last menstrual period was 01/25/2015.    General:  Well appearing and no acute distress  Head:  Normocephalic  Eyes:  Normal lids/lashes and normal conjunctiva  Neck:  Supple   Cardiovascular: 2+ DP and PT pulses. Brisk cap refill  Musculoskeletal: Exam is limited secondary to pt's pain, pt is able to fully flex and extend at R knee, able to lift leg off exam table,  tender over medial and lateral joint line at R knee, minimal tenderness over patella and R scaphoid, pain extends to mid shaft of tibia, no tenderness to medial or lateral malleolus,midfoot, base of the 5th metatarsal, FROM of R ankle,    Skin:  Warm/dry, no rash, mild edema to R knee, and no ecchymosis or erythema noted   Psychiatric:  Appropriate affect and behavior  Neurologic:   Alert and oriented x 3. Normal sensation to RLE.     Data Reviewed:    Radiography:  As read by Dr.Rogers at 1600, no acute fracture or bony abnormality     Course: Condition at discharge: Good     Differential Diagnosis: contusion vs sprain vs fracture     Assessment:   1. Fall    2. Right hand pain    3. Right knee pain        Plan:    Orders Placed This Encounter    CANCELED: Thumb Spica Splint (Right)    Right Hand x-ray    Right Knee x-ray    Right Tibia-Fibula x-ray    AMB CONSULT/REFERRAL [facility]    Ibuprofen (MOTRIN) 600 mg Oral Tablet    DME WRIST SPLINT(REQUISITION/REQ)          Pt placed in thumb spica in clinic.   Educated on RICE therapy and NSAID use.   Advised to follow up with Ortho as needed.     Go to Emergency Department immediately for further work  up if any concerning symptoms.  Plan was discussed and patient verbalized understanding. If symptoms are worsening or not improving the patient should return to the Urgent Care for further evaluation.    I am scribing for, and in the presence of, Caesar Chestnut, APRN for services provided on 01/31/2015.  Olin Pia, SCRIBE     [place], SCRIBE 01/31/2015, 16:08  I personally performed the services described in this documentation, as scribed  in my presence, and it is both accurate  and complete.    Unknown Jim Korion Cuevas, APRN  The co-signing faculty was physically present and available for consultation and did  particpate in the care of this patient through interpretation of the XRs.  Unknown Jim Marlin, APRN  01/31/2015, 16:59

## 2015-01-31 NOTE — Progress Notes (Signed)
Patient has given verbal permission for the student scribe to assist the healthcare provider during the patients visit.[address], RN 01/31/2015, 15:18

## 2015-01-31 NOTE — Progress Notes (Signed)
Applied thumb spica splint to right hand. Patient instructed on proper use and wear. Patient tolerated and verbalized understanding. Roosvelt Maser, RTR  01/31/2015, 16:18\

## 2015-01-31 NOTE — Telephone Encounter (Signed)
Prior Authorization form for drug Stelara faxed to [facility] for provider signature.    Hosie Poisson, CPhT  01/31/2015, 15:18

## 2015-02-01 NOTE — Telephone Encounter (Signed)
Prior authorization form signed by provider Dr Lorenso Courier has been sent to payor [employer].  Waiting for response from payor.    Hosie Poisson, CPhT  02/01/2015, 16:49

## 2015-02-03 NOTE — Telephone Encounter (Signed)
-----   Message from Micah Noel Juhl sent at 02/01/2015  1:50 PM EST -----  Dr. Lorenso Courier    Pt calling because [facility] let her know they received the following rx instead of it being sent to our office. Please advise.    ustekinumab (STELARA) 45 mg/0.5 mL Subcutaneous Syringe 0.5 mL 0 01/30/2015 01/30/2015   Sig - Route: 0.5 mL (45 mg total) by Subcutaneous route One time for 1 dose - Subcutaneous

## 2015-02-03 NOTE — Telephone Encounter (Signed)
Prior Authorization for drug Marcy Panning has been denied on date 02/03/15.  Payor [employer] requires a trial of preferred biologic agent Humira has not been documented.  Will send inbasket to Dr Lorenso Courier.  Hosie Poisson, CPhT  02/03/2015, 13:57

## 2015-02-08 ENCOUNTER — Telehealth (HOSPITAL_BASED_OUTPATIENT_CLINIC_OR_DEPARTMENT_OTHER): Payer: Self-pay | Admitting: DERMATOLOGY

## 2015-02-08 ENCOUNTER — Ambulatory Visit (HOSPITAL_BASED_OUTPATIENT_CLINIC_OR_DEPARTMENT_OTHER): Payer: Self-pay | Admitting: DERMATOLOGY

## 2015-02-08 NOTE — Telephone Encounter (Signed)
-----   Message from Hosie Poisson, CPhT sent at 02/03/2015  1:58 PM EST -----  Dr Lorenso Courier,  Prior Berkley Harvey request for Marcy Panning has been denied. [employer] requires a trial of the preferred biologic agent (Humira) to be documented.  Thank you,  Corrie Dandy

## 2015-02-08 NOTE — Telephone Encounter (Signed)
Called patient  She is reluctant to try humira as she has read that it has side effects  She liked the stelara she received in [place]  She will think about it and call back  Joyce Crusoe Bettey Mare, MD

## 2015-02-15 ENCOUNTER — Ambulatory Visit (HOSPITAL_BASED_OUTPATIENT_CLINIC_OR_DEPARTMENT_OTHER): Payer: BC Managed Care – PPO | Admitting: ORTHOPEDIC, SPORTS MEDICINE

## 2015-02-22 ENCOUNTER — Ambulatory Visit (HOSPITAL_BASED_OUTPATIENT_CLINIC_OR_DEPARTMENT_OTHER): Payer: Self-pay | Admitting: DERMATOLOGY

## 2015-02-22 NOTE — Telephone Encounter (Signed)
Regarding: letter   ----- Message from Sander Nephew sent at 02/17/2015 12:35 PM EST -----  Dr. Lorenso Courier:     The pt states The [employer] [facility] [place] pt assistant program was mailing you a letter for a diagnosis code and the information for the [facility] medication.  She is asking if you've received this?

## 2015-02-22 NOTE — Telephone Encounter (Signed)
Called patient and explained that she can send the forms but she has Insurance and the Assistance program may not approve this since she has Insurance. Advised she may want to think about the Humira which can be covered by her Insurance. She will have assistance program fax the information. Harriette Ohara, LPN

## 2015-02-24 ENCOUNTER — Ambulatory Visit (HOSPITAL_BASED_OUTPATIENT_CLINIC_OR_DEPARTMENT_OTHER): Payer: BC Managed Care – PPO | Admitting: ORTHOPEDIC, SPORTS MEDICINE

## 2015-03-08 ENCOUNTER — Encounter (HOSPITAL_BASED_OUTPATIENT_CLINIC_OR_DEPARTMENT_OTHER): Payer: Self-pay | Admitting: ORTHOPEDIC, SPORTS MEDICINE

## 2015-03-08 ENCOUNTER — Ambulatory Visit: Payer: BLUE CROSS/BLUE SHIELD | Attending: ORTHOPEDIC, SPORTS MEDICINE | Admitting: ORTHOPEDIC, SPORTS MEDICINE

## 2015-03-08 VITALS — BP 114/61 | HR 81 | Temp 97.9°F | Ht 62.0 in | Wt 120.2 lb

## 2015-03-08 DIAGNOSIS — M222X9 Patellofemoral disorders, unspecified knee: Secondary | ICD-10-CM

## 2015-03-08 DIAGNOSIS — S8000XA Contusion of unspecified knee, initial encounter: Principal | ICD-10-CM | POA: Insufficient documentation

## 2015-03-08 DIAGNOSIS — M25561 Pain in right knee: Secondary | ICD-10-CM | POA: Insufficient documentation

## 2015-03-08 DIAGNOSIS — W19XXXA Unspecified fall, initial encounter: Secondary | ICD-10-CM

## 2015-03-08 DIAGNOSIS — M222X2 Patellofemoral disorders, left knee: Secondary | ICD-10-CM | POA: Insufficient documentation

## 2015-03-08 DIAGNOSIS — Z791 Long term (current) use of non-steroidal anti-inflammatories (NSAID): Secondary | ICD-10-CM | POA: Insufficient documentation

## 2015-03-08 DIAGNOSIS — M222X1 Patellofemoral disorders, right knee: Secondary | ICD-10-CM | POA: Insufficient documentation

## 2015-03-08 DIAGNOSIS — M25562 Pain in left knee: Secondary | ICD-10-CM | POA: Insufficient documentation

## 2015-03-08 MED ORDER — PREDNISONE 20 MG TABLET
20.0000 mg | ORAL_TABLET | Freq: Every day | ORAL | 0 refills | Status: AC
Start: 2015-03-08 — End: 2015-03-15

## 2015-03-08 NOTE — Progress Notes (Signed)
Name: Joyce Hunt  MEDICAL RECORD [id number]  DATE OF BIRTH: October 15, 1980  DATE OF SERVICE: 03/08/2015    CHIEF COMPLAINT:   Chief Complaint   Patient presents with    Knee Pain     bilateral    Hand Pain     right       SUBJECTIVE:  Joyce Hunt presents to clinic today for evaluation of her bilateral knee pain as the hand pain now is not significant and more of a forearm pain with a lot of writing.   In late January 2017 while walking down a hill in the rain she slid and fell onto a metal street grate landing onto the front of both knees and caught herself with her right hand  Developed pain in both knees and right hand as well as bruising and swelling  Was seen at Urgent Care on the day of injury where had x-rays and started on ibuprofen and icing and wrist splint and 3-4 days of rest  Found improvement from the ibuprofen and icing in swelling but these did not seem to help the pain as much  Wore the wrist splint for 2-3 weeks too as instructed by Urgent Care; this helped and now has been out of the splint for a week  However still with significant knee pain, R>L, in spite of gradually easing back into activities after resting as instructed  Pain felt diffusely about anterior knees bilaterally with radiation into proximal lower legs; this pain is intermittent, occurring with walking, stairs, and increased activity generally  Still taking ibuprofen 1-2 times per day but it does not seem to be helping with the pain  Some paresthesias about distal quad bilaterally with prolonged sitting  No locking  No giving way    PAST MEDICAL, SURGICAL, AND FAMILY HISTORY  Reviewed and updated as appropriate    SOCIAL HISTORY  Social History   Substance Use Topics    Smoking status: Former Smoker    Smokeless tobacco: Never Used      Comment: quit 2010    Alcohol use Yes      Comment: 2-3 drinks 1-2 times per month       MEDICATIONS  Outpatient Prescriptions Marked as Taking for the 03/08/15 encounter (Office Visit) with Coy Saunas, MD   Medication Sig Dispense Refill    betamethasone valerate (VALISONE) 0.1 % Ointment Apply topically Twice daily 45 g 2    Ibuprofen (MOTRIN) 600 mg Oral Tablet Take 1 Tab (600 mg total) by mouth Every 8 hours 30 Tab 0        ALLERGIES  Allergies   Allergen Reactions    Mushrooms [Mushroom Flavor] Itching and Swelling       REVIEW OF SYSTEMS  As above, all others negative beyond fatigue of which PCP is aware, and psoriasis which is being treated.     PHYSICAL EXAM  Visit Vitals    BP 114/61    Pulse 81    Temp 36.6 C (97.9 F) (Thermal Scan)    Ht 1.575 m ( )    Wt 54.5 kg (120 lb 2.4 oz)    BMI 21.98 kg/m2   General: Well-developed, well-nourished female in no acute distress  HEENT: Normocephalic, atraumatic, mucous membranes moist, neck supple  CV: Good perfusion throughout  Pulm: Breathing comfortably on room air  Skin: Warm and dry to touch with scattered psoriatic patches  Psych: Alert and oriented x 3, normal mood and affect  Neurologic: Grossly intact  to motor / sensation  Gait: Normal    On physical examination of the patient's bilateral knees:  Inspection: normal  Tenderness to Palpation: diffusely about the knee including joint lines and patellar facets bilaterally  Swelling: no effusion bilaterally  Range of Motion: 0-120 degrees actively bilaterally  Strength: 5/5 in flexion and extension bilaterally with pain with resisted extension  Special tests: (defer) patellar grind, (-) anterior/posterior drawer, (-) varus/valgus stress at 0 and 20 degrees, (-) McMurray's    STUDIES:   Reviewed right knee and tibia-fibula x-rays performed on 31 Jan 2015 demonstrating no acute osseous abnormality.    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 She is amenable to plan noted.   Plan: predniSONE  (DELTASONE) 20 mg Oral Tablet, AMB CONSULT/REFERRAL PHYS THERAPY- EXTERNAL, icing          Patient Instructions:   Start prednisone as 1 pill in the morning with food  Do not take ibuprofen or any other NSAIDs with the prednisone  Do not start the Stelara until at least a week after finishing the steroid  Start physical therapy  Ice each evening for 15-20 minutes 2-3 times    She will f/u in 4 weeks, sooner as needed.      Coy Saunas, MD

## 2015-03-08 NOTE — Patient Instructions (Signed)
Start prednisone as 1 pill in the morning with food  Do not take ibuprofen or any other NSAIDs with the prednisone  Do not start the Stelara until at least a week after finishing the steroid  Start physical therapy  Ice each evening for 15-20 minutes 2-3 times

## 2015-03-23 ENCOUNTER — Ambulatory Visit (HOSPITAL_BASED_OUTPATIENT_CLINIC_OR_DEPARTMENT_OTHER): Payer: Self-pay | Admitting: DERMATOLOGY

## 2015-03-23 NOTE — Telephone Encounter (Signed)
Patient was denied the assistance for Stelara, she is requesting this be sent in through insurance and have the medication sent to us and injections done in office. Joyce Hunt, [place]  03/23/2015, 14:12

## 2015-03-23 NOTE — Telephone Encounter (Signed)
Regarding: Rx status  ----- Message from Elyse JarvisJessica D Estep sent at 03/22/2015  4:19 PM EDT -----  Miles CostainPowers, Roxann L, MD    Per pt she is requesting to speak with you in regards to the status on the USTEKINUMAB [facility](STELARA SUBQ), by Subcutaneous route Every four months.

## 2015-03-24 ENCOUNTER — Ambulatory Visit (HOSPITAL_BASED_OUTPATIENT_CLINIC_OR_DEPARTMENT_OTHER): Payer: Self-pay | Admitting: DERMATOLOGY

## 2015-03-24 ENCOUNTER — Other Ambulatory Visit (INDEPENDENT_AMBULATORY_CARE_PROVIDER_SITE_OTHER): Payer: Self-pay | Admitting: Pharmacist

## 2015-03-24 ENCOUNTER — Other Ambulatory Visit (HOSPITAL_BASED_OUTPATIENT_CLINIC_OR_DEPARTMENT_OTHER): Payer: Self-pay | Admitting: DERMATOLOGY

## 2015-03-24 DIAGNOSIS — L409 Psoriasis, unspecified: Secondary | ICD-10-CM

## 2015-03-24 NOTE — Telephone Encounter (Signed)
Prior authorization request for drug Stelara received by [facility] on 03/24/2015. Benefits investigation to be completed. Thanks.

## 2015-03-24 NOTE — Telephone Encounter (Signed)
Rx needed no prior approval. Called to let patient know and also that we can deliver to her as well. Obtaining copay assistance for her as well. Pt will call with delivery date.Hosie PoissonMary K Jareth Pardee, CPhT  03/24/2015, 17:19

## 2015-03-27 ENCOUNTER — Encounter (HOSPITAL_BASED_OUTPATIENT_CLINIC_OR_DEPARTMENT_OTHER): Payer: Self-pay | Admitting: DERMATOLOGY

## 2015-03-27 NOTE — Progress Notes (Signed)
Hosie PoissonMartin, Mary K, CPhT  Viviann SpareLockwood, Carney Saxton Ellen, [place] Cc: Powers, Jill Sideoxann L, MD               Pt was correct in saying the rx is now covered. I did a test claim and came back covered with a copay of 33.33. I will contact the patient and set up fill and delivery with her. I can use the RX that was sent over previously if you's like.   Thank you,   Corrie DandyMary

## 2015-03-30 ENCOUNTER — Ambulatory Visit: Payer: BLUE CROSS/BLUE SHIELD | Attending: DERMATOLOGY

## 2015-03-30 DIAGNOSIS — L400 Psoriasis vulgaris: Secondary | ICD-10-CM | POA: Insufficient documentation

## 2015-03-30 NOTE — Progress Notes (Signed)
Patient came in for her first Stelara injection. Injection given on the posterior left arm subcutaneously. Patient instructed to wait for 20 mins to watch for any adverse reactions and had none (she states that she has had in the past as well and was fine). Injection instructions given to patient if she would like to do at home as well. Burnice LoganJessica A Joelynn Dust, LPN

## 2015-04-05 ENCOUNTER — Encounter (HOSPITAL_BASED_OUTPATIENT_CLINIC_OR_DEPARTMENT_OTHER): Payer: BC Managed Care – PPO | Admitting: ORTHOPEDIC, SPORTS MEDICINE

## 2015-04-13 ENCOUNTER — Encounter (HOSPITAL_BASED_OUTPATIENT_CLINIC_OR_DEPARTMENT_OTHER): Payer: Self-pay | Admitting: ORTHOPEDIC, SPORTS MEDICINE

## 2015-04-19 ENCOUNTER — Other Ambulatory Visit (HOSPITAL_BASED_OUTPATIENT_CLINIC_OR_DEPARTMENT_OTHER): Payer: Self-pay | Admitting: DERMATOLOGY

## 2015-04-19 MED ORDER — USTEKINUMAB 45 MG/0.5 ML SUBCUTANEOUS SYRINGE
45.0000 mg | INJECTION | Freq: Once | SUBCUTANEOUS | 0 refills | Status: AC
Start: 2015-04-19 — End: 2015-04-19

## 2015-04-21 ENCOUNTER — Ambulatory Visit: Payer: BLUE CROSS/BLUE SHIELD | Attending: ORTHOPEDIC, SPORTS MEDICINE | Admitting: ORTHOPEDIC, SPORTS MEDICINE

## 2015-04-21 ENCOUNTER — Encounter (HOSPITAL_BASED_OUTPATIENT_CLINIC_OR_DEPARTMENT_OTHER): Payer: Self-pay | Admitting: ORTHOPEDIC, SPORTS MEDICINE

## 2015-04-21 VITALS — BP 95/59 | HR 71 | Temp 97.9°F

## 2015-04-21 DIAGNOSIS — S86892A Other injury of other muscle(s) and tendon(s) at lower leg level, left leg, initial encounter: Secondary | ICD-10-CM | POA: Insufficient documentation

## 2015-04-21 DIAGNOSIS — M25561 Pain in right knee: Secondary | ICD-10-CM | POA: Insufficient documentation

## 2015-04-21 DIAGNOSIS — L400 Psoriasis vulgaris: Secondary | ICD-10-CM | POA: Insufficient documentation

## 2015-04-21 DIAGNOSIS — Z87891 Personal history of nicotine dependence: Secondary | ICD-10-CM | POA: Insufficient documentation

## 2015-04-21 DIAGNOSIS — S86891A Other injury of other muscle(s) and tendon(s) at lower leg level, right leg, initial encounter: Secondary | ICD-10-CM | POA: Insufficient documentation

## 2015-04-21 DIAGNOSIS — M25562 Pain in left knee: Secondary | ICD-10-CM | POA: Insufficient documentation

## 2015-04-21 MED ORDER — IBUPROFEN 600 MG TABLET
600.0000 mg | ORAL_TABLET | Freq: Three times a day (TID) | ORAL | 0 refills | Status: DC | PRN
Start: 2015-04-21 — End: 2015-05-05

## 2015-04-21 NOTE — Progress Notes (Signed)
Name: Joyce Hunt  MEDICAL RECORD [id number]  DATE OF BIRTH: [birth year]  DATE OF SERVICE: 04/21/2015    CHIEF COMPLAINT:   Chief Complaint   Patient presents with    Knee Pain       SUBJECTIVE:  Joyce Perchesriya Duve presents to clinic today for follow up of her bilateral knee pain.   Since last visit completed steroid course as planned and it helped some but could still feel some tightness  Then pain started to worsen when she started walking and using her legs more - feeling a tightness in the calves - then when tried to do dance cardio started having anterior shin pain again last week with anterior knee pain with prolonged sitting or bending of the knees  Not currently taking anything for the pain  Did not get to attend physical therapy as planned  (+) swelling on the right  No locking; reports anterior "clicking" with motion when asked about locking  No true giving way    PAST MEDICAL HISTORY  Reviewed and updated as appropriate    SOCIAL HISTORY  Social History   Substance Use Topics    Smoking status: Former Smoker    Smokeless tobacco: Never Used      Comment: quit 2010    Alcohol use Yes      Comment: 2-3 drinks 1-2 times per month       MEDICATIONS  Outpatient Prescriptions Marked as Taking for the 04/21/15 encounter (Office Visit) with Coy SaunasHarrison, Jayelyn Barno F, MD   Medication Sig Dispense Refill    betamethasone valerate (VALISONE) 0.1 % Ointment Apply topically Twice daily 45 g 2    USTEKINUMAB (STELARA SUBQ) by Subcutaneous route          ALLERGIES  Allergies   Allergen Reactions    Mushrooms [Mushroom Flavor] Itching and Swelling       REVIEW OF SYSTEMS  As above, all others non-contributory.     PHYSICAL EXAM  BP (!) 95/59   Pulse 71   Temp 36.6 C (97.9 F) (Thermal Scan)    General: Well-developed, well-nourished female in no acute distress  HEENT: Normocephalic, atraumatic, mucous membranes moist, neck supple  CV: Good perfusion throughout  Pulm: Breathing comfortably on room air  Skin: Warm and dry to touch  with scattered psoriatic patches  Psych: Alert and oriented x 3, normal mood and affect  Neurologic: Grossly intact to motor / sensation  Gait: Normal    On physical examination of the patient's bilateral knees:  Inspection: normal  Tenderness to Palpation: medial and lateral joint line and medial and lateral patellar facets bilaterally, patellar tendon area on the right (where she hit with her fall), proximal half of tibia bilaterally  Swelling: no effusion bilaterally  Range of Motion: 0-120 degrees actively bilaterally  Strength: 4/5 in flexion and extension bilaterally  Special tests: (defer) patellar grind, (+) J sign, (-) Lachman's, (-) varus/valgus stress at 0 and 20 degrees, and (-) McMurray's all bilaterally    STUDIES:   Deferred    ASSESSMENT AND PLAN:  (M25.561,  M25.562) Bilateral knee pain  (primary encounter diagnosis)  (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 She is open to plan noted.   Plan: Ibuprofen (MOTRIN) 600 mg Oral Tablet, icing          Patient Instructions:    Start ibuprofen as 1 pill 3 times a day with food for the next 1-2 weeks, then can continue as needed for pain after that  Start icing, including ice massage, to front of knees and shins    She will f/u in 2 weeks, sooner as needed.      Coy Saunas, MD

## 2015-04-21 NOTE — Patient Instructions (Signed)
Start ibuprofen as 1 pill 3 times a day with food for the next 1-2 weeks, then can continue as needed for pain after that  Start icing, including ice massage, to front of knees and shins

## 2015-05-02 ENCOUNTER — Ambulatory Visit (HOSPITAL_BASED_OUTPATIENT_CLINIC_OR_DEPARTMENT_OTHER): Payer: Self-pay | Admitting: DERMATOLOGY

## 2015-05-02 NOTE — Telephone Encounter (Signed)
Regarding: pt calling  ----- Message from Dayton ScrapeBrittany Cummings sent at 05/02/2015  2:18 PM EDT -----  Miles CostainPowers, Roxann L, MD    Pt called needing to speak with you. Stated you were the last person to do a TB test on them and wanted to know if you could fax the results to [facility]. Also stated they would like call when the fax is sent. Please call them back to advise, thank you.    Fax:   [phone]

## 2015-05-02 NOTE — Telephone Encounter (Signed)
Instructed pt to call medical records to get results.  Rosaland LaoMandi Jo Sivak, LPN

## 2015-05-05 ENCOUNTER — Encounter (HOSPITAL_BASED_OUTPATIENT_CLINIC_OR_DEPARTMENT_OTHER): Payer: Self-pay | Admitting: ORTHOPEDIC, SPORTS MEDICINE

## 2015-05-05 ENCOUNTER — Encounter (HOSPITAL_BASED_OUTPATIENT_CLINIC_OR_DEPARTMENT_OTHER): Payer: Self-pay | Admitting: DERMATOLOGY

## 2015-05-05 ENCOUNTER — Ambulatory Visit: Payer: BLUE CROSS/BLUE SHIELD | Attending: ORTHOPEDIC, SPORTS MEDICINE | Admitting: ORTHOPEDIC, SPORTS MEDICINE

## 2015-05-05 VITALS — BP 115/60 | HR 77 | Temp 97.5°F

## 2015-05-05 DIAGNOSIS — M222X1 Patellofemoral disorders, right knee: Secondary | ICD-10-CM | POA: Insufficient documentation

## 2015-05-05 DIAGNOSIS — M222X2 Patellofemoral disorders, left knee: Secondary | ICD-10-CM | POA: Insufficient documentation

## 2015-05-05 DIAGNOSIS — M25561 Pain in right knee: Secondary | ICD-10-CM

## 2015-05-05 DIAGNOSIS — M25562 Pain in left knee: Secondary | ICD-10-CM

## 2015-05-05 DIAGNOSIS — S86891D Other injury of other muscle(s) and tendon(s) at lower leg level, right leg, subsequent encounter: Principal | ICD-10-CM | POA: Insufficient documentation

## 2015-05-05 DIAGNOSIS — Z87891 Personal history of nicotine dependence: Secondary | ICD-10-CM | POA: Insufficient documentation

## 2015-05-05 MED ORDER — IBUPROFEN 600 MG TABLET
600.0000 mg | ORAL_TABLET | Freq: Three times a day (TID) | ORAL | 0 refills | Status: DC | PRN
Start: 2015-05-05 — End: 2015-10-30

## 2015-05-05 NOTE — Patient Instructions (Signed)
Continue ibuprofen as needed for pain  Continue activity as tolerated beyond avoiding kneeling and ease back into fully activity  Start physical therapy  Continue icing, ideally each evening

## 2015-05-05 NOTE — Progress Notes (Signed)
Name: Joyce Hunt  MEDICAL RECORD [id number]  DATE OF BIRTH: 03-27-1980  DATE OF SERVICE: 05/05/2015    CHIEF COMPLAINT:   Chief Complaint   Patient presents with    Knee Pain       SUBJECTIVE:  Joyce Hunt presents to clinic today for follow up of her bilateral knee, shin pain.   Has improved since last visit and now mainly pain on the right  Has been taking ibuprofen as prescribed and it helps  Was able to complete a Zumba class (modified to limit pressure on the front of her legs) with only "regular workout" pain the day afterwards  However had some anterior knee pain and pressure though while just standing in the kitchen and also had some posterior ankle burning pain while sitting  Thus, right knee pain is mainly anterior, intermittent, exacerbated with getting up from sitting or while sleeping (particularly if leaning too much on that side) or when sitting cross-legged  Still cannot kneel on hard or carpeted surface on the right knee though able to kneel on the left knee now  No significant swelling but still having "tight feeling" on both legs  No locking but anterior catching at times, such as when was standing in the kitchen with the pain noted above  No true giving way    PAST MEDICAL HISTORY  Reviewed and updated as appropriate    SOCIAL HISTORY  Social History   Substance Use Topics    Smoking status: Former Smoker    Smokeless tobacco: Never Used      Comment: quit 2010    Alcohol use Yes      Comment: 2-3 drinks 1-2 times per month       MEDICATIONS  Outpatient Prescriptions Marked as Taking for the 05/05/15 encounter (Office Visit) with Coy SaunasHarrison, Camila Norville F, MD   Medication Sig Dispense Refill    betamethasone valerate (VALISONE) 0.1 % Ointment Apply topically Twice daily 45 g 2    Ibuprofen (MOTRIN) 600 mg Oral Tablet Take 1 Tab (600 mg total) by mouth Three times a day as needed for Pain 90 Tab 0    USTEKINUMAB (STELARA SUBQ) by Subcutaneous route          ALLERGIES  Allergies   Allergen  Reactions    Mushrooms [Mushroom Flavor] Itching and Swelling       REVIEW OF SYSTEMS  As above, all others non-contributory.     PHYSICAL EXAM  BP 115/60   Pulse 77   Temp 36.4 C (97.5 F)   General: Well-developed, well-nourished female in no acute distress  HEENT: Normocephalic, atraumatic, mucous membranes moist, neck supple  CV: Good perfusion throughout  Pulm: Breathing comfortably on room air  Skin: Warm and dry to touch with improving appearance to psoriatic plaques  Psych: Alert and oriented x 3, normal mood and affect  Neurologic: Grossly intact to motor / sensation  Gait: Normal    On physical examination of the patient's bilateral knees:  Inspection: normal  Tenderness to Palpation: medial and lateral joint line bilaterally, medial patellar facet bilaterally, lateral patellar facet on the right and patellar tendon on the right  Swelling: no effusion bilaterally  Range of Motion: 0-135 degrees actively  Strength: 5/5 in flexion and extension with pain with resisted knee extension on the right  Special tests: (-) varus/valgus stress at 0 and 20 degrees bilaterally, (-) McMurray's bilaterally    STUDIES:   Deferred    ASSESSMENT AND PLAN:  (M25.561,  M25.562)  Bilateral knee pain  (primary encounter diagnosis)  (B14.782N) Shin splint, right, subsequent encounter  (M22.2X1,  M22.2X2) Patellofemoral arthralgia of both knees  Some improvement noted but now suspect shin splint on the right. Discussed management options at this time to help facilitate further improvement.   Plan: AMB CONSULT/REFERRAL PHYS THERAPY- EXTERNAL, Ibuprofen (MOTRIN) 600 mg Oral Tablet          Patient Instructions:    Continue ibuprofen as needed for pain  Continue activity as tolerated beyond avoiding kneeling and ease back into fully activity  Start physical therapy  Continue icing, ideally each evening    She will f/u in 6 weeks, sooner as needed.      Coy Saunas, MD

## 2015-05-12 NOTE — Telephone Encounter (Signed)
Called pt left message call clinic unsure of why pt called.   Rosaland LaoMandi Jo Sivak, LPN

## 2015-05-12 NOTE — Telephone Encounter (Signed)
Regarding: return call  ----- Message from Michel Bickersheryl Brison sent at 05/12/2015  1:35 PM EDT -----   Miles CostainPowers, Roxann L, MD    Patient states she is returning a call to a nurse.

## 2015-06-16 ENCOUNTER — Encounter (HOSPITAL_BASED_OUTPATIENT_CLINIC_OR_DEPARTMENT_OTHER): Payer: BLUE CROSS/BLUE SHIELD | Admitting: ORTHOPEDIC, SPORTS MEDICINE

## 2015-07-03 ENCOUNTER — Ambulatory Visit: Payer: BLUE CROSS/BLUE SHIELD | Attending: DERMATOLOGY

## 2015-07-03 DIAGNOSIS — L400 Psoriasis vulgaris: Secondary | ICD-10-CM | POA: Insufficient documentation

## 2015-07-03 NOTE — Nursing Note (Signed)
07/03/15 1500   Medication Administration   Initials mf   Medication  (stelara)   Medication Dose 45mg /0.135ml   Route of Administration SQ   Site Left Thigh   NDC # [id number]   LOT # [id number]   Expiration date 06/07/16   Manufacturer [employer]   Clinic Supplied No   Patient Supplied Yes

## 2015-07-03 NOTE — Progress Notes (Signed)
Stelara injection given left thigh SQ per pt request. Pt denies any s/s discomfort or issues.  Bueford Arp Kathlene NovemberJo Fullen, LPN

## 2015-07-24 ENCOUNTER — Encounter (HOSPITAL_BASED_OUTPATIENT_CLINIC_OR_DEPARTMENT_OTHER): Payer: BC Managed Care – PPO | Admitting: DERMATOLOGY

## 2015-07-26 ENCOUNTER — Other Ambulatory Visit (HOSPITAL_BASED_OUTPATIENT_CLINIC_OR_DEPARTMENT_OTHER): Payer: Self-pay | Admitting: DERMATOLOGY

## 2015-07-26 MED ORDER — USTEKINUMAB 45 MG/0.5 ML SUBCUTANEOUS SOLUTION
1.0000 | Freq: Once | SUBCUTANEOUS | 0 refills | Status: DC
Start: 2015-07-26 — End: 2015-08-04

## 2015-07-26 NOTE — Telephone Encounter (Signed)
-----   Message from Rebecka ApleyLucy Grimm sent at 07/25/2015  2:43 PM EDT -----  Miles CostainPowers, Roxann L, MD    Past: 1.16.17  Future: 8.21.17    ustekinumab (STELARA) 45 mg/0.5 mL Subcutaneous Syringe 0.5 mL 0 04/19/2015 04/19/2015  Sig : 0.5 mL (45 mg total) by Subcutaneous route One time for 1 dose    Route: Subcutaneous        Preferred Pharmacy     [facility] [place] [id number]    [address] [place] [id number]    Phone: [phone] Fax: [phone]    Not a 24 hour pharmacy; exact hours not known

## 2015-08-04 NOTE — Telephone Encounter (Signed)
Regarding: rx  ----- Message from Michel Bickers sent at 08/04/2015 12:46 PM EDT -----  Doctor Name:    Miles Costain, MD    It shows this medication was sent to the pharmacy but they say they didn't get it.     Date of last appointment:  1.16.17    Next scheduled visit:  8.21.17    Medication Requested:         Ustekinumab (STELARA) 45 mg/0.5 mL Subcutaneous Solution 1 Vial 0 07/26/2015 07/26/2015  Sig : 1 Ampule by Subcutaneous route One time for 1 dose    Route: Subcutaneous    Non-formulary Exception Code: [id number]/No Formulary Info Available    Class: E-Rx      Preferred Pharmacy: Preferred Pharmacy           [facility] - [place], [place] - [address] [facility]    [address] [place] [address]    Phone: [phone] Fax: [phone]    Not a 24 hour pharmacy; exact hours not known

## 2015-08-07 MED ORDER — USTEKINUMAB 45 MG/0.5 ML SUBCUTANEOUS SYRINGE
45.00 mg | INJECTION | SUBCUTANEOUS | 0 refills | Status: DC
Start: 2015-08-07 — End: 2016-03-25

## 2015-08-28 ENCOUNTER — Encounter (INDEPENDENT_AMBULATORY_CARE_PROVIDER_SITE_OTHER): Payer: Self-pay | Admitting: Internal Medicine

## 2015-08-28 ENCOUNTER — Encounter (HOSPITAL_BASED_OUTPATIENT_CLINIC_OR_DEPARTMENT_OTHER): Payer: Self-pay | Admitting: DERMATOLOGY

## 2015-08-29 ENCOUNTER — Telehealth (INDEPENDENT_AMBULATORY_CARE_PROVIDER_SITE_OTHER): Payer: Self-pay | Admitting: Internal Medicine

## 2015-08-29 DIAGNOSIS — Z01419 Encounter for gynecological examination (general) (routine) without abnormal findings: Secondary | ICD-10-CM

## 2015-08-29 NOTE — Telephone Encounter (Signed)
-----   Message from ArcolaPriya Barwick sent at 08/28/2015  4:20 PM EDT -----  Regarding: Referral Question  Contact: [phone]  Good Afternoon Dr. Christell ConstantMoore:    I see that a PAP Smear apt requirement popped up in my chart but I don't have a Gyno Doctor out here [place]. If you can refer me to a good doctor it would greatly be helpful. Also, is there anything else I need to schedule with you in terms of a check up or so? Kindly let me know. I have a new contact number as well. [phone]. Please let me know at your convenience, thank you so much.     Also, are you a primary care doctor?    -Carylon Perchesriya Nater

## 2015-09-27 ENCOUNTER — Ambulatory Visit (HOSPITAL_BASED_OUTPATIENT_CLINIC_OR_DEPARTMENT_OTHER): Payer: BLUE CROSS/BLUE SHIELD | Admitting: Obstetrics & Gynecology

## 2015-10-02 ENCOUNTER — Ambulatory Visit: Payer: BLUE CROSS/BLUE SHIELD | Attending: DERMATOLOGY

## 2015-10-02 DIAGNOSIS — L400 Psoriasis vulgaris: Secondary | ICD-10-CM | POA: Insufficient documentation

## 2015-10-02 NOTE — Nursing Note (Signed)
10/02/15 1500   Medication Administration   Initials mf   Medication  (stelera)   Medication Dose 0.545ml   Route of Administration SQ   Site (left thigh)   NDC # [id number]   LOT # [id number]   Expiration date 12/25/15   Manufacturer [employer]   Clinic Supplied No   Patient Supplied Yes   Comments: given to pt per pt request

## 2015-10-05 ENCOUNTER — Encounter (HOSPITAL_BASED_OUTPATIENT_CLINIC_OR_DEPARTMENT_OTHER): Payer: BLUE CROSS/BLUE SHIELD | Admitting: DERMATOLOGY

## 2015-10-22 ENCOUNTER — Encounter (HOSPITAL_COMMUNITY): Payer: Self-pay

## 2015-10-22 ENCOUNTER — Emergency Department (HOSPITAL_COMMUNITY): Payer: BLUE CROSS/BLUE SHIELD

## 2015-10-22 ENCOUNTER — Emergency Department
Admission: EM | Admit: 2015-10-22 | Discharge: 2015-10-23 | Disposition: A | Discharge status: Home / Self Care | Payer: BLUE CROSS/BLUE SHIELD | Attending: Emergency Medicine | Admitting: Emergency Medicine

## 2015-10-22 DIAGNOSIS — R109 Unspecified abdominal pain: Secondary | ICD-10-CM

## 2015-10-22 DIAGNOSIS — Z87891 Personal history of nicotine dependence: Secondary | ICD-10-CM | POA: Insufficient documentation

## 2015-10-22 DIAGNOSIS — R110 Nausea: Secondary | ICD-10-CM | POA: Insufficient documentation

## 2015-10-22 DIAGNOSIS — R319 Hematuria, unspecified: Secondary | ICD-10-CM | POA: Insufficient documentation

## 2015-10-22 DIAGNOSIS — R1031 Right lower quadrant pain: Secondary | ICD-10-CM

## 2015-10-22 DIAGNOSIS — M549 Dorsalgia, unspecified: Secondary | ICD-10-CM | POA: Insufficient documentation

## 2015-10-22 DIAGNOSIS — Z3202 Encounter for pregnancy test, result negative: Secondary | ICD-10-CM | POA: Insufficient documentation

## 2015-10-22 DIAGNOSIS — R102 Pelvic and perineal pain: Secondary | ICD-10-CM

## 2015-10-22 DIAGNOSIS — R200 Anesthesia of skin: Secondary | ICD-10-CM | POA: Insufficient documentation

## 2015-10-22 DIAGNOSIS — N83202 Unspecified ovarian cyst, left side: Secondary | ICD-10-CM | POA: Insufficient documentation

## 2015-10-22 DIAGNOSIS — L400 Psoriasis vulgaris: Secondary | ICD-10-CM | POA: Insufficient documentation

## 2015-10-22 DIAGNOSIS — N83201 Unspecified ovarian cyst, right side: Secondary | ICD-10-CM | POA: Insufficient documentation

## 2015-10-22 DIAGNOSIS — Z79899 Other long term (current) drug therapy: Secondary | ICD-10-CM | POA: Insufficient documentation

## 2015-10-22 DIAGNOSIS — Z8041 Family history of malignant neoplasm of ovary: Secondary | ICD-10-CM | POA: Insufficient documentation

## 2015-10-22 HISTORY — DX: Unspecified abdominal pain: R10.9

## 2015-10-22 MED ORDER — SODIUM CHLORIDE 0.9 % INTRAVENOUS SOLUTION
INTRAVENOUS | Status: DC
Start: 2015-10-23 — End: 2015-10-23

## 2015-10-22 NOTE — ED Nurses Note (Signed)
Pt comes to the ED with c/o right lower quadrant abdominal pain. Pt began menstruation Saturday night. Pt having some nausea from the pain. Abdomen tender to RLQ with palpation. Pt took 600mg  of Ibuprofen and states the pain got worse. Pt denies any malodorous and abnormal vaginal discharge. Denies burning with urination but endorses urinary frequency after menstruation. Pt reports painful ovulation to RLQ.

## 2015-10-22 NOTE — ED Provider Notes (Signed)
Department of Emergency Medicine  [facility]  10/22/2015    Attending Physician: Despina Pole, MD  Resident Physician:  Micah Noel, MD    Chief Complaint:  Chief Complaint   Patient presents with    Abdominal Pain     lower abdominal pain x 1 day. pt is on her menstral cycle and states "It's really bad this time." +nausea. Pt denies urinary symptoms.        History of Present Illness:   Joyce Hunt is a 35 y.o. female presenting to the Emergency Department via car with complaints of abdominal pain which started today.      Significant PMHx: G1P0A1.  No history of bleeding disorders.     Significant PSHx: Surgical abortion.  No other abdominal surgeries.   Significant FHx: Ovarian cancer.   Allergies: Mushrooms   Social history:  Former smoker.     The patient reports that she has had a recent cold ~4-5 days.  Last night the patient started her menstrual period.  Today patient developed pain mostly in her RLQ which she describes as sharp.  Pain worse after taking 600 mg Ibuprofen, has not attempted any other medications.  Associated complaints of numbness in lower extremities, nausea, back pain, and some vaginal blood clots.  Patient states that she has not engaged in intercourse in the last 2 months.     Denies dysuria, vaginal discharge and any other current complaints.      History Limitations: None    Review of Systems:   Constitutional: No fever or chills.   Skin: No rashes or diaphoresis.   HENT: No headaches or congestion.   Eyes: No vision changes.   Cardio: No chest pain.   Respiratory: No cough, wheezing or SOB.   GI:  +abodminal pain, nausea   GU:  No dysuria or vaginal discharge.  +vaginal blood clots   MSK: +back pain   Neuro: No LOC. +Numbness    All other systems negative.    Medications:  Prior to Admission Medications   Prescriptions Last Dose Informant Patient Reported? Taking?   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           Past Surgical History:  Past Surgical History:   Procedure Laterality Date    HX NO SURGICAL PROCEDURES             Social History:  Social History   Substance Use Topics    Smoking status: Former Smoker    Smokeless tobacco: Never Used      Comment: quit 2010    Alcohol use Yes      Comment: 2-3 drinks 1-2 times per month       Family History:  Family Medical History  Problem Relation (Age of Onset)    Arthritis-rheumatoid Paternal Grandmother    Diabetes Mother, Maternal Aunt    Heart Disease Father    Hypertension Mother    Liver Disease Father    Ovarian Cancer Maternal Grandmother    Psoriasis Maternal Uncle    Stroke Maternal Grandfather              Physical Exam:  All nurse's notes reviewed.  Filed Vitals:    10/22/15 2316   BP: 104/62   Pulse: 73   Resp: 16   Temp: 36 C (96.8 F)   SpO2: 100%        Patient does not need supplemental oxygen      Constitutional: NAD.   HEENT:    Head: Normocephalic.  Atraumatic.   Neck:  Supple.     Cardiovascular: Regular rate.  Regular rhythm. No murmurs, rubs, or gallops.     Pulmonary/Chest: No respiratory distress.  Breath sounds equal bilaterally, good air movement. No wheezes, rales, or rhonchi.     Abdominal: Tenderness to palpation over suprapubic area, worse in RLQ.   Pelvic:  R adnexal tenderness. No CMT. Mild L adnexal tenderness. Dark blood at os.    Musculoskeletal: No obvious deformity or swelling.    Skin:  Warm and dry. No rash, erythema, pallor or cyanosis.   Psychiatric: Behavior is normal. Mood and affect congruent.   Neurological: Alert and oriented. Grossly intact.      Orders:  Orders Placed This Encounter    CBC/DIFF    BASIC METABOLIC PANEL, NON-FASTING    LIPASE    ALK PHOS (ALKALINE PHOSPHATASE)    ALT (SGPT)    AST (SGOT)    BILIRUBIN, CONJUGATED (DIRECT)    GAMMA GT    URINALYSIS, MACROSCOPIC AND MICROSCOPIC W/CULTURE REFLEX    HIV1/HIV2 SCREEN, COMBINED ANTIGEN AND ANTIBODY    CBC WITH DIFF    URINALYSIS, MACROSCOPIC    INSERT & MAINTAIN PERIPHERAL IV ACCESS    NS premix infusion       Abnormal Lab results:  Labs Reviewed   BASIC METABOLIC PANEL - Abnormal; Notable for the following:        Result Value    CO2 TOTAL 20 (*)     All other components within normal limits    Narrative:     Hemolysis can alter results at this level (slight).  Lipemia can alter results at this level (slight).   GAMMA GT - Abnormal; Notable for the following:     GGT 138 (*)     All other components within normal limits    Narrative:     Hemolysis can alter results at this level (slight).   CBC WITH DIFF - Abnormal; Notable for the following:     WBC 12.3 (*)     RDW 11.8 (*)     All other components within normal limits   URINALYSIS, MACROSCOPIC - Abnormal; Notable for the following:     PROTEIN 30  (*)     BLOOD Large (*)     LEUKOCYTES Moderate (*)     APPEARANCE Cloudy (*)     All other components within normal limits   MANUAL DIFFERENTIAL - Abnormal; Notable for the following:     NEUTROPHIL ABSOLUTE 9.23 (*)     MONOCYTE ABSOLUTE 0.12 (*)     All other components within normal limits   URINALYSIS, MICROSCOPIC - Abnormal; Notable for the following:     WBCS 56.0 (*)  RBCS >182.0 (*)     All other components within normal limits   WETMOUNT - Normal    Narrative:     The wet prep is a presumptive rapid test and should be confirmed by clinical exam and/or other methods.  Delay in transport or desiccation of  swab may reduce test sensitivity.   LIPASE - Normal   ALK PHOS (ALKALINE PHOSPHATASE) - Normal   ALT (SGPT) - Normal    Narrative:     Lipemia can alter results at this level (slight).   AST (SGOT) - Normal    Narrative:     Lipemia can alter results at this level (slight).   BILIRUBIN, CONJUGATED (DIRECT) - Normal   HIV1/HIV2 SCREEN, COMBINED ANTIGEN AND ANTIBODY - Normal   URINE CULTURE   CBC/DIFF    Narrative:     The following orders were created for panel order CBC/DIFF.  Procedure                               Abnormality         Status                     ---------                               -----------         ------                     CBC WITH DIFF[[id number]]                Abnormal            Final result               MANUAL DIFFERENTIAL (CEL.Marland KitchenMarland Kitchen[[id number]]                                                 MANUAL DIFFERENTIAL[[id number]]          Abnormal            Final result                 Please view results for these tests on the individual orders.   URINALYSIS, MACROSCOPIC AND MICROSCOPIC W/CULTURE REFLEX    Narrative:     The following orders were created for panel order URINALYSIS, MACROSCOPIC AND MICROSCOPIC W/CULTURE REFLEX.  Procedure                               Abnormality         Status                     ---------                               -----------         ------                     URINALYSIS, MACROSCOPIC[[id number]]      Abnormal            Final result  URINALYSIS, MICROSCOPIC[[id number]]      Abnormal            Final result                 Please view results for these tests on the individual orders.   NEISSERIA GONORRHOEAE DNA BY PCR   CHLAMYDIA TRACHOMITIS DNA BY PCR (INHOUSE)         Imaging:  [place] RUQ: Normal appearing appendix, normal uterus, no free fluid. Ovaries with small cysts bilaterally.         EKG:    None    Plan:   Appropriate labs and imaging ordered. Medical Records reviewed.    Therapy/Procedures/Course/MDM:    Imaging showed: small ovarian  cysts, normal appearing appendix on [place]   Lab results remarkable for: hematuria - pt on menstrual period.    Patient received: Zofran for nausea   Prescriptions were written for ibuprofen    Results discussed with the patient. The patient was given the opportunity to ask questions.     [place] negative for appendicitis, torsion, no free fluid. Pregnancy test negative.     Given exam and [place] lower likelihood appendicitis   wetmount negative   Discussed findings with patient - return precautions given.    Ibuprofen for pain   FU with GYN and PCP.    It was advised that the patient return to the ED with any new, concerning or worsening symptoms and follow up as directed.    Patient to follow up GYN, PCP      Impression:    Encounter Diagnoses   Name Primary?    Abdominal pain, unspecified abdominal location Yes    Pelvic pain        Disposition:    Discharged     Following the above history, physical exam, and studies, the patient was deemed stable and suitable for discharge.   she will follow up with GYN in 3d.    ibuprofen was prescribed.  Medication instructions were discussed with the patient/patient's family.It was advised that the patient return to the ED with any new, concerning or worsening symptoms and follow up as directed.    The patient's family verbalized understanding of all instructions and had no further questions or concerns.     Follow Up:    No follow-up provider specified.  No future appointments.     Prescriptions:   New Prescriptions    No medications on file       Attestation:   I am scribing for, and in the presence of, Micah Noel, MD, for services provided on 10/22/2015.    Buckner Malta, SCRIBE    [place], SCRIBE  10/22/2015, 23:38        I personally performed the services described in this documentation, as scribed  in my presence, and it is both accurate  and complete.    Micah Noel, MD      Lenis Noon, MD   10/23/2015, 06:16  [facility]  PGY-2  Emergency Medicine  Pager [phone]

## 2015-10-22 NOTE — ED Attending Note (Signed)
ED ATTENDING NOTE:    I was physically present and directly supervised this patients care. Patient seen and examined with the resident/MLP, Dr. Posey BoyerLasure, and history and exam reviewed. Key elements in addition to and/or correction of that documentation are as follows:    HPI:  Joyce Hunt is a 35 y.o. female who presents with complaint of abdominal pain.  Lower abdomen.  No dysuria.  No hematuria.  Nausea but no vomiting.    Currently on menstrual period.     Reports a history of ovarian cancer in the family.     Further historical details can be found in the resident/MLP note.    PERTINENT PHYSICAL EXAM:  ED Triage Vitals   Enc Vitals Group      BP (Non-Invasive) 10/22/15 2316 104/62      Heart Rate 10/22/15 2316 73      Respiratory Rate 10/22/15 2316 16      Temperature 10/22/15 2316 36 C (96.8 F)      Temp src --       SpO2-1 10/22/15 2316 100 %      Weight 10/22/15 2316 57.7 kg (127 lb 3.3 oz)      Height 10/22/15 2316 1.575 m (5\' 2" )      Head Cir --       Peak Flow --       Pain Score --       Pain Loc --       Pain Edu? --       Excl. in GC? --          I have seen and physically examined the patient.  I agree with the physical exam as documented in Dr. Leanna BattlesLasure's note.    LABS: Reviewed    IMAGING: Reviewed    ECG: None      ED COURSE:  Patient remained stable while in the ED.      Patient presented with abdominal pain.  Concern for ovarian pathology versus UTI versus appendicitis versus menstrual cramps.  Bedside US looked at the ovaries and appendix.  These images were unremarkable.    DISPO: Discharged  CLINICAL IMPRESSION:   Encounter Diagnoses   Name Primary?    Abdominal pain, unspecified abdominal location Yes    Pelvic pain          Chad CordialBrenden J Everest Brod, MD  10/22/2015, 23:45

## 2015-10-23 ENCOUNTER — Other Ambulatory Visit (HOSPITAL_COMMUNITY): Payer: Self-pay

## 2015-10-23 LAB — LIPASE: LIPASE: 58 U/L (ref 10–80)

## 2015-10-23 LAB — CHLAMYDIA TRACHOMITIS DNA BY PCR (INHOUSE): CHLAMYDIA TRACHOMATIS PCR: NOT DETECTED

## 2015-10-23 LAB — ALK PHOS (ALKALINE PHOSPHATASE): ALKALINE PHOSPHATASE: 116 U/L (ref <150)

## 2015-10-23 LAB — ALT (SGPT): ALT (SGPT): 54 U/L (ref <55)

## 2015-10-23 LAB — URINALYSIS, MICROSCOPIC
RBCS: >182 /HPF — ABNORMAL HIGH (ref <6.0)
WBCS: 56 /HPF — ABNORMAL HIGH (ref <11.0)

## 2015-10-23 LAB — WETMOUNT
CLUE CELLS: ABSENT
TRICHOMONAS: ABSENT
YEAST: ABSENT

## 2015-10-23 LAB — MANUAL DIFFERENTIAL
EOSINOPHIL %: 4 %
EOSINOPHIL ABSOLUTE: 0.49 x10ˆ3/uL (ref 0.00–0.50)
LYMPHOCYTE %: 20 %
LYMPHOCYTE ABSOLUTE: 2.46 x10ˆ3/uL (ref 1.00–4.80)
MONOCYTE %: 1 %
MONOCYTE ABSOLUTE: 0.12 x10ˆ3/uL — ABNORMAL LOW (ref 0.30–1.00)
NEUTROPHIL %: 75 %
NEUTROPHIL ABSOLUTE: 9.23 x10ˆ3/uL — ABNORMAL HIGH (ref 1.50–7.70)
PLATELET MORPHOLOGY COMMENT: NORMAL
RBC MORPHOLOGY COMMENT: NORMAL
WBC: 12.3 10*3/uL
WBC: 12.3 x10ˆ3/uL

## 2015-10-23 LAB — BASIC METABOLIC PANEL
ANION GAP: 12 mmol/L (ref 4–13)
BUN/CREA RATIO: 16 (ref 6–22)
BUN: 13 mg/dL (ref 8–25)
CALCIUM: 9.9 mg/dL (ref 8.5–10.2)
CHLORIDE: 104 mmol/L (ref 96–111)
CO2 TOTAL: 20 mmol/L — ABNORMAL LOW (ref 22–32)
CREATININE: 0.83 mg/dL (ref 0.49–1.10)
ESTIMATED GFR: >59 mL/min/1.73mˆ2 (ref >59)
GLUCOSE: 98 mg/dL (ref 65–139)
POTASSIUM: 3.7 mmol/L (ref 3.5–5.1)
SODIUM: 136 mmol/L (ref 136–145)

## 2015-10-23 LAB — URINALYSIS, MACROSCOPIC
BILIRUBIN: NEGATIVE mg/dL
COLOR: NORMAL
GLUCOSE: NEGATIVE mg/dL
KETONES: NEGATIVE mg/dL
NITRITE: NEGATIVE
PH: 5 (ref 5.0–8.0)
PROTEIN: 30 mg/dL — AB
SPECIFIC GRAVITY: 1.01 (ref 1.005–1.030)
UROBILINOGEN: NEGATIVE mg/dL

## 2015-10-23 LAB — BILIRUBIN, CONJUGATED (DIRECT): BILIRUBIN DIRECT: 0.1 mg/dL (ref <0.3)

## 2015-10-23 LAB — CBC WITH DIFF
HCT: 40.6 % (ref 33.5–45.2)
HGB: 14.1 g/dL (ref 11.2–15.2)
MCH: 30.2 pg (ref 27.4–33.0)
MCHC: 34.6 g/dL (ref 32.5–35.8)
MCV: 87.1 fL (ref 78.0–100.0)
MPV: 7.6 fL (ref 7.5–11.5)
PLATELETS: 352 x10ˆ3/uL (ref 140–450)
RBC: 4.67 x10ˆ6/uL (ref 3.63–4.92)
RDW: 11.8 % — ABNORMAL LOW (ref 12.0–15.0)
WBC: 12.3 x10ˆ3/uL — ABNORMAL HIGH (ref 3.5–11.0)

## 2015-10-23 LAB — GAMMA GT: GGT: 138 U/L — ABNORMAL HIGH (ref 7–50)

## 2015-10-23 LAB — NEISSERIA GONORRHOEAE DNA BY PCR: NEISSERIA GONORRHOEAE PCR: NOT DETECTED

## 2015-10-23 LAB — AST (SGOT): AST (SGOT): 36 U/L (ref 8–41)

## 2015-10-23 MED ORDER — IBUPROFEN 200 MG TABLET
600.00 mg | ORAL_TABLET | Freq: Four times a day (QID) | ORAL | 0 refills | Status: DC | PRN
Start: 2015-10-23 — End: 2015-10-30

## 2015-10-23 MED ORDER — SODIUM CHLORIDE 0.9 % IV BOLUS
1000.00 mL | INJECTION | Status: AC
Start: 2015-10-23 — End: 2015-10-23
  Administered 2015-10-23: 1000 mL via INTRAVENOUS

## 2015-10-23 MED ORDER — ONDANSETRON HCL (PF) 4 MG/2 ML INJECTION SOLUTION
4.00 mg | INTRAMUSCULAR | Status: AC
Start: 2015-10-23 — End: 2015-10-23
  Administered 2015-10-23: 4 mg via INTRAVENOUS
  Filled 2015-10-23: qty 2

## 2015-10-23 NOTE — Nurses Notes (Signed)
Pt iv removed, catheter intact,bleeding controlled,sterile bandage applied. Pt verbally acknowledged dc instructions.Left with steady gait.

## 2015-10-23 NOTE — Procedures (Signed)
Encounter Date: 10/22/2015    Emergency Department Procedure:    Limited Abdominal Ultrasound:    Indications - Concern for:: Appendicitis  Findings: : Normal appearing appendix .61cm. No significant edema or TTP.   Attending Physician: Terance HartBrendan Hoang Pettingill           Encounter Date: 10/22/2015     Emergency Department Procedure:    Female Pelvic Ultrasound:    Indication:: Lower abdominal pain   Transabdominal Ultrasound Interpretation: No Intrauterine Pregnancy Seen   Transvaginal Ultrasound Interpretation (Performed to obtain more detailed view of Adnexal Structures): No Intrauterine Pregnancy Seen   Summary:: Negative within limits & scope of exam  Attending Physician: Alda BertholdBrendan Jacey Eckerson      Ben Lasure, MD   10/23/2015, 06:40  [facility] [place]  PGY-2 Emergency Medicine  Pager [phone]       I was physically present for the key portions of obtaining the US images, have personally reviewed and interpreted the images and agree with the findings as documented.    Chad CordialBrenden J Kassady Laboy, MD

## 2015-10-24 LAB — URINE CULTURE: URINE CULTURE: <10000

## 2015-10-30 ENCOUNTER — Ambulatory Visit (INDEPENDENT_AMBULATORY_CARE_PROVIDER_SITE_OTHER): Payer: BLUE CROSS/BLUE SHIELD | Admitting: Internal Medicine

## 2015-10-30 ENCOUNTER — Encounter (INDEPENDENT_AMBULATORY_CARE_PROVIDER_SITE_OTHER): Payer: Self-pay | Admitting: Internal Medicine

## 2015-10-30 VITALS — BP 104/66 | HR 108 | Temp 97.6°F | Resp 18 | Ht 62.0 in | Wt 126.0 lb

## 2015-10-30 DIAGNOSIS — N938 Other specified abnormal uterine and vaginal bleeding: Secondary | ICD-10-CM

## 2015-10-30 DIAGNOSIS — G43109 Migraine with aura, not intractable, without status migrainosus: Secondary | ICD-10-CM

## 2015-10-30 DIAGNOSIS — S86891D Other injury of other muscle(s) and tendon(s) at lower leg level, right leg, subsequent encounter: Secondary | ICD-10-CM

## 2015-10-30 DIAGNOSIS — Z23 Encounter for immunization: Secondary | ICD-10-CM

## 2015-10-30 DIAGNOSIS — M222X2 Patellofemoral disorders, left knee: Secondary | ICD-10-CM

## 2015-10-30 DIAGNOSIS — M1990 Unspecified osteoarthritis, unspecified site: Secondary | ICD-10-CM

## 2015-10-30 DIAGNOSIS — L400 Psoriasis vulgaris: Secondary | ICD-10-CM

## 2015-10-30 DIAGNOSIS — M222X1 Patellofemoral disorders, right knee: Secondary | ICD-10-CM

## 2015-10-30 DIAGNOSIS — M25562 Pain in left knee: Secondary | ICD-10-CM

## 2015-10-30 DIAGNOSIS — Z6823 Body mass index (BMI) 23.0-23.9, adult: Secondary | ICD-10-CM

## 2015-10-30 DIAGNOSIS — M25561 Pain in right knee: Secondary | ICD-10-CM

## 2015-10-30 HISTORY — DX: Unspecified osteoarthritis, unspecified site: M19.90

## 2015-10-30 HISTORY — DX: Migraine with aura, not intractable, without status migrainosus: G43.109

## 2015-10-30 HISTORY — DX: Other specified abnormal uterine and vaginal bleeding: N93.8

## 2015-10-30 MED ORDER — IBUPROFEN 600 MG TABLET
600.0000 mg | ORAL_TABLET | Freq: Four times a day (QID) | ORAL | 5 refills | Status: DC | PRN
Start: 2015-10-30 — End: 2016-08-23

## 2015-10-30 NOTE — Nursing Note (Signed)
Patient received vaccine in clinic.  Tolerated it well, given VIS sheet and was discharged to home.  Immunization administered     Name Date Dose VIS Date Route    INFLUENZA VACCINE IM 10/30/2015 0.5 mL 08/13/2013 Intramuscular    Site: Left deltoid    Given By: Faythe CasaShrader, Bryceson Grape Marie, MA    Manufacturer: [employer]    Lot: [id number]    NDC: [id number]            Faythe CasaAshley Marie Sartaj Hoskin, MA  10/30/2015, 14:14

## 2015-10-30 NOTE — Progress Notes (Signed)
[facility]    OUTPATIENT PROGRESS NOTE    Subjective:   Ms. Merrily PewChand is a pleasant 35 y.o. female who is a regular patient of mine who comes to clinic today for ER follow-up. Presented there for abdominal pain associated with onset of menstrual period. Of note had Gyn appt 9/20 that was a NOS.  Also has plaque psoriasis, multiple complaints of joint pain in past: multiple NOS appts with derm and ortho.  Patient plans to reschedule with Gyn. States feeling a little better, but has very painful periods, also gets a lot of cramps with ovulation.  When went to ER pain was so severe couldn't even stand or walk.  Has always had very painful periods, never has been on OCPs.  Complaining of right arm pain where had IV.    Patient would like to try ortho tri cyclen  Headaches with aura yes gets migraines and with last one saw [facility] spot  Epilepsy/Seizures and medications: no   Emotional problems No   Thyroid diseaseNo   Breast cancer or other breast problems No   First degree relative with history of breast cancer? No   Heart diseases or abnormal heart conditions No   High blood pressure No   Circulatory problems No   Blood Clots/DVT No   Family member with history of Blood Clots/DVT No   Cancer No   Liver disease/ gallbladder disease/Hepatitis No   Malabsorptive diseases including bariatric surgery No   Diabetes No   Hormone problems No   Ovaries, tubes, uterus problems No       Reviewed today (Per EMR): active problem list, medication list, allergies         Review of systems:   Positive for migraines, painful periods, arm and joint pain    Problem List:     Patient Active Problem List   Diagnosis    Plaque psoriasis       Medications:     Current Outpatient Prescriptions   Medication Sig    betamethasone valerate (VALISONE) 0.1 % Ointment Apply topically Twice daily    Ibuprofen (MOTRIN) 200 mg Oral Tablet Take 3 Tabs (600 mg total) by mouth Four times a day as needed for Pain     Ibuprofen (MOTRIN) 600 mg Oral Tablet Take 1 Tab (600 mg total) by mouth Three times a day as needed for Pain    ustekinumab (STELARA) 45 mg/0.5 mL Subcutaneous Syringe 0.5 mL (45 mg total) by Subcutaneous route Per instructions Every 4 months       Allergies:     Allergies   Allergen Reactions    Mushrooms [Mushroom Flavor] Itching and Swelling       Family History:     Family Medical History     Problem Relation (Age of Onset)    Arthritis-rheumatoid Paternal Grandmother    Diabetes Mother, Maternal Aunt    Heart Disease Father    Hypertension Mother    Liver Disease Father    Ovarian Cancer Maternal Grandmother    Psoriasis Maternal Uncle    Stroke Maternal Grandfather              Social History:     Social History   Substance Use Topics    Smoking status: Former Smoker    Smokeless tobacco: Never Used      Comment: quit 2010    Alcohol use Yes      Comment: 2-3 drinks 1-2 times per month  Objective:   Vitals:  Blood pressure 104/66, pulse (!) 108, temperature 36.4 C (97.6 F), temperature source Thermal Scan, resp. rate 18, height 1.575 m (5\' 2" ), weight 57.2 kg (126 lb), last menstrual period 10/22/2015, SpO2 99 %.   Body mass index is 23.05 kg/(m^2).  General: NAD, non-toxic      Labs:   Reviewed records from ER visit      Assessment & Plan:   Cale was seen today for hospital follow up.    Diagnoses and all orders for this visit:    Need for influenza vaccination    Plaque psoriasis: should see rheumatology as may have component of psoriatic arthritis  -     Refer to [facility] Rheumatology; Future    Arthritis  -     Refer to [facility] Rheumatology; Future    Dysfunctional uterine bleeding: given history of migraine with aura, estrogen containing OCPs contraindicated due to increased risk of stroke, patient plans to follow with GYN to discuss IUD, nexplanon, discussed premedicating with NSAID starting day before period and continuing through first 2 days    Migraine with aura    Bilateral knee pain:  refilled ibuprofen  -     Ibuprofen (MOTRIN) 600 mg Oral Tablet; Take 1 Tab (600 mg total) by mouth Four times a day as needed for Pain    Shin splint, right, subsequent encounter  -     Ibuprofen (MOTRIN) 600 mg Oral Tablet; Take 1 Tab (600 mg total) by mouth Four times a day as needed for Pain    Patellofemoral arthralgia of both knees  -     Ibuprofen (MOTRIN) 600 mg Oral Tablet; Take 1 Tab (600 mg total) by mouth Four times a day as needed for Pain    Other orders  -     Influenza Vaccine IM Age 61 to Adult (Admin)     >50% 25 minute visit spent counseling patient on risks of estrogen in migraine with aura, other options for controlling menstrual cramps, use of ibuprofen, need for further testing psoriatic arthritis    RTC if symptoms persist or worsen.    Marshall Cork, MD  Internal Medicine and Pediatrics  Assistant Professor    [facility]  [address]  [place], [place] [address]  [phone]

## 2015-11-08 DIAGNOSIS — W19XXXA Unspecified fall, initial encounter: Secondary | ICD-10-CM

## 2015-11-08 HISTORY — DX: Unspecified fall, initial encounter: W19.XXXA

## 2015-11-23 ENCOUNTER — Encounter (HOSPITAL_BASED_OUTPATIENT_CLINIC_OR_DEPARTMENT_OTHER): Payer: Self-pay | Admitting: DERMATOLOGY

## 2015-11-23 NOTE — Patient Instructions (Signed)
Instructed pt that she needed to either come to our office and sign a release of records or go to medical records to get TB results. Pt verbalized understanding.   Fady Stamps Kathlene NovemberJo Fullen, LPN

## 2015-12-03 ENCOUNTER — Encounter (INDEPENDENT_AMBULATORY_CARE_PROVIDER_SITE_OTHER): Payer: Self-pay

## 2015-12-03 ENCOUNTER — Ambulatory Visit (INDEPENDENT_AMBULATORY_CARE_PROVIDER_SITE_OTHER): Payer: BLUE CROSS/BLUE SHIELD

## 2015-12-03 VITALS — BP 95/68 | HR 87 | Temp 96.4°F | Resp 16 | Ht 62.0 in | Wt 128.3 lb

## 2015-12-03 DIAGNOSIS — M25511 Pain in right shoulder: Secondary | ICD-10-CM

## 2015-12-03 DIAGNOSIS — M25521 Pain in right elbow: Secondary | ICD-10-CM

## 2015-12-03 DIAGNOSIS — W19XXXA Unspecified fall, initial encounter: Secondary | ICD-10-CM

## 2015-12-03 DIAGNOSIS — Z6823 Body mass index (BMI) 23.0-23.9, adult: Secondary | ICD-10-CM

## 2015-12-03 DIAGNOSIS — W109XXA Fall (on) (from) unspecified stairs and steps, initial encounter: Secondary | ICD-10-CM

## 2015-12-03 DIAGNOSIS — M533 Sacrococcygeal disorders, not elsewhere classified: Secondary | ICD-10-CM

## 2015-12-03 MED ORDER — CYCLOBENZAPRINE 10 MG TABLET
10.00 mg | ORAL_TABLET | Freq: Three times a day (TID) | ORAL | 0 refills | Status: DC | PRN
Start: 2015-12-03 — End: 2016-08-23

## 2015-12-03 MED ORDER — LIDOCAINE 5 % TOPICAL PATCH
1.00 | MEDICATED_PATCH | Freq: Every day | CUTANEOUS | 0 refills | Status: DC | PRN
Start: 2015-12-03 — End: 2016-09-02

## 2015-12-03 NOTE — Progress Notes (Addendum)
History of Present Illness: Joyce Hunt is a 35 y.o. female who presents to the Urgent Care today with chief complaint of    Chief Complaint     Back Injury pt fell       .   Pt presents to UC with c/o R lower back pain onset 4 days ago. She sts that she was hopping down steps in socks when she landed "weird", causing her to slip and fall down two wooden steps. Pt notes that when she fell she landed on her tailbone and R side which she sts caused her to injure her R knee, R elbow, and R shoulder as well. Pt describes her R leg as feeling "heavy" and she mentions that her pain is exacerbated by walking or putting pressure on her R side. She sts that after her fall she experiences "severe dizziness" and "felt like she was going to faint". Pt reports that she tried deep breathing exercises immediately after her fall to control her dizziness. She mentions that her pain radiates from her back down into her R thigh and buttocks. Pt notes that she has tried taking Ibuprofen and icing the affected areas with mild relief of her pain. She describes her pain as a 7/10 currently and sts that it has slightly improved since her injury. Pt mentions that she has recently begun to experience pressure of her R lower back while urinating. She associates paresthesias. Pt denies a fever and incontinence.    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     Past Surgical History:    Past Surgical History:   Procedure Laterality Date    HX ELECTIVE ABORTION      HX NO SURGICAL PROCEDURES       Allergies:  Allergies   Allergen Reactions    Mushrooms [Mushroom Flavor] Itching and Swelling     Medications:    Current Outpatient Prescriptions   Medication Sig    cyclobenzaprine (FLEXERIL) 10 mg  Oral Tablet Take 1 Tab (10 mg total) by mouth Three times a day as needed for Muscle spasms    Ibuprofen (MOTRIN) 600 mg Oral Tablet Take 1 Tab (600 mg total) by mouth Four times a day as needed for Pain    lidocaine (LIDODERM) 5 % Adhesive Patch, Medicated 1 Patch (700 mg total) by Transdermal route Once per day as needed    ustekinumab (STELARA) 45 mg/0.5 mL Subcutaneous Syringe 0.5 mL (45 mg total) by Subcutaneous route Per instructions Every 4 months     Social History:    Social History   Substance Use Topics    Smoking status: Former Smoker    Smokeless tobacco: Never Used      Comment: quit 2010    Alcohol use Yes      Comment: 2-3 drinks 1-2 times per month     Family History: No significant family history.  Family Medical History     Problem Relation (Age of Onset)    Arthritis-rheumatoid Paternal Grandmother    Cirrhosis Father    Diabetes Mother, Maternal Aunt    Heart Disease Father    Hypertension Mother    Liver Disease Father    Ovarian Cancer Maternal Grandmother  Psoriasis Maternal Uncle    Stroke Maternal Grandfather        Review of Systems:    General: no fever  Musculoskeletal:  R lower back pain, R knee pain, R elbow pain, R shoulder pain and tailbone pain  Neurologic:  paresthesias and no incontinence  All other review of systems were negative    Physical Exam:  Vital signs:   Vitals:    12/03/15 1837   BP: 95/68   Pulse: 87   Resp: 16   Temp: 35.8 C (96.4 F)   TempSrc: Tympanic   SpO2: 99%   Weight: 58.2 kg (128 lb 4.9 oz)   Height: 1.575 m (5\' 2" )     Body mass index is 23.47 kg/(m^2). Facility age limit for growth percentiles is 20 years.  Patient's last menstrual period was 11/14/2015 (approximate).    General:  Well appearing and no acute distress  Head:  Normocephalic  Eyes:  Normal lids/lashes and normal conjunctiva  Neck:  Supple  Pulmonary:  Clear to auscultation bilaterally, no wheezes, no rales and no rhonchi  Cardiovascular:  Regular rate/rhythm, normal S1/S2 and no  murmur/rub/gallop  Musculoskeletal: 4/5 muscle strength of R extremity secondary to pain but otherwise normal muscle strength of upper extremities, full ROM of upper extremities, negative straight leg raise bilaterally, no significant tenderness to palpation along cervical, thoracic and lumbar spine, mild tenderness noted along sacrum and coccyx, tenderness to palpation along sacral lumbar paraspinal muscles bilaterally, tenderness noted at SI joint bilaterally R>L, tenderness to palpation noted at ischial tuberosity R>L, tenderness noted to gluteal muscles on R, tenderness to palpation noted at R clavicle and AC joint, no tenderness noted along humerus, tenderness to palpation noted along olecranon, and no significant tenderness to palpation over radius or ulna  Skin:  Warm/dry and no rash, no ecchymosis, edema or erythema  Psychiatric:  Appropriate affect and behavior  Vascular: UE pulses intact and symmetric  Neurologic:  Alert and oriented x 3 and upper and lower sensation grossly intact and symmetric    Data Reviewed:    Radiography:  12/03/15 Sacrum and Coccyx XR as reviewed by Dr. Edwyna ShellFarjo at [id number]: no acute fracture or dislocation    12/03/15 R elbow XR as reviewed by Dr. Edwyna ShellFarjo at [id number]: no acute fracture or dislocation     12/03/15 R shoulder XR as reviewed by Dr. Edwyna ShellFarjo at 1940: no acute fracture or dislocation    Course: Condition at discharge: Good     Differential Diagnosis: bone contusion vs muscle strain vs spasm    Assessment:   1. Fall, initial encounter    2. Sacral pain    3. Right elbow pain    4. Acute pain of right shoulder        Plan:    Orders Placed This Encounter    Sacrum and Coccyx x-ray    Right Elbow x-ray    Right Shoulder x-ray    cyclobenzaprine (FLEXERIL) 10 mg Oral Tablet    lidocaine (LIDODERM) 5 % Adhesive Patch, Medicated         Xrays negative as above.   Take Flexeril as Rx.  Use Lidoderm Patch as Rx.  Return to clinic if symptoms persist or worsen.    Go to Emergency  Department immediately for further work up if any concerning symptoms.  Plan was discussed and patient verbalized understanding. If symptoms are worsening or not improving the patient should return to the Urgent Care for further evaluation.  I am scribing for, and in the presence of, Dr. Edwyna Shell for services provided on 12/03/2015.  Angel Riggin, SCRIBE     [place], [place] 12/04/2015, 16:50    I personally performed the services described in this documentation, as scribed  in my presence, and it is both accurate  and complete.    Berdine Addison, DO

## 2015-12-03 NOTE — Patient Instructions (Signed)
[facility]      Operated by [facility]  [address] [place]  [place], [place] [id number]  Phone: [phone])  Fax: [phone]  [email or web address]  Open Daily 8:00a - 8:00p    Closed Thanksgiving and Christmas Day  [facility]     Operated by [facility]  [address]  [facility]  [place], [place] [id number]  Phone: [phone]  Fax: [phone]  [email or web address]  Open M-F  8:00a - 8:00p  Sat              10:00a - 4:00p  Sun             Closed  Closed all  Holidays        Attending Caregiver: Berdine AddisonSara Lakevia Perris, DO      Today's orders:   Orders Placed This Encounter    Sacrum and Coccyx x-ray    Right Elbow x-ray    Right Shoulder x-ray    cyclobenzaprine (FLEXERIL) 10 mg Oral Tablet    lidocaine (LIDODERM) 5 % Adhesive Patch, Medicated        Prescription(s) E-Rx to:  [id number] - [place], [place] - [address]    ________________________________________________________________________  Short Term Disability and Family Medical Leave Act  [facility] does NOT provide assistance with any disability applications.  If you feel your medical condition requires you to be on disability, you will need to follow up with  your primary care physician or a specialist.  We apologize for any inconvenience.    For Medication Prescribed by [facility]:  As an Urgent Care facility, our clinic does NOT offer prescription refills over the telephone.    If you need more of the medication one of our medical providers prescribed, you will  either need to be re-evaluated by us or see your primary care physician.    ________________________________________________________________________      It is very important that we have a phone number.  This is the single best way to contact you in the event that we become aware of important clinical information or concerns after your discharge.  If the  phone number you provided at registration is NOT this number you should inform staff and registration prior to leaving.      Your treatment and evaluation today was focused on identifying and treating potentially emergent conditions based on your presenting signs, symptoms, and history.  The resulting initial clinical impression and treatment plan is not intended to be definitive or a substitute for a full physical examination and evaluation by your primary care provider.  If your symptoms persist, worsen, or you develop any new or concerning symptoms, you need to be evaluated.      If you received x-rays during your visit, be aware that the final and formal interpretation of those films by a radiologist may occur after your discharge.  If there is a significant discrepancy identified after your discharge, we will contact you at the telephone number provided at registration.      If you received a pelvic exam, you may have cultures pending for sexually transmitted diseases.  Positive cultures are reported to the [facility] Department of Health as required by state law.  You may contact the Health Information Management Office of [facility] to get a copy of your results.     If you are over 33 year old, we cannot discuss your personal health information with  a parent, spouse, family member, or anyone else without your consent.  This does not include those who have legitimate access to your records and information to assist in your care under the provisions of HIPAA [facility](Health Insurance Portability and Accountability Act) law, or those to whom you have previously given written consent to do so, such a legal guardian or Power of [place].      Instructions are discussed with patient upon discharge by clinical staff with all questions answered.  Please call [facility] [phone]) if any further questions develop.  Go immediately to the emergency department if any concerns or worsening symptoms.      [name],  DO 12/03/2015, 19:50

## 2015-12-18 ENCOUNTER — Other Ambulatory Visit (HOSPITAL_BASED_OUTPATIENT_CLINIC_OR_DEPARTMENT_OTHER): Payer: Self-pay | Admitting: DERMATOLOGY

## 2015-12-18 NOTE — Telephone Encounter (Signed)
Patient has failed several appointments  May not have refill till seen  Miles Costainoxann L Merie Wulf, MD

## 2015-12-18 NOTE — Telephone Encounter (Signed)
Called and left message for pt to return call to clinic. She has not been in clinic since 2/17 and needs an appointment prior to receiving a refill.  Keyshia Orwick Kathlene NovemberJo Fullen, LPN

## 2015-12-29 ENCOUNTER — Ambulatory Visit (HOSPITAL_BASED_OUTPATIENT_CLINIC_OR_DEPARTMENT_OTHER): Payer: BLUE CROSS/BLUE SHIELD

## 2015-12-29 ENCOUNTER — Ambulatory Visit: Payer: BLUE CROSS/BLUE SHIELD | Attending: Rheumatology | Admitting: Rheumatology

## 2015-12-29 DIAGNOSIS — M1990 Unspecified osteoarthritis, unspecified site: Secondary | ICD-10-CM

## 2015-12-29 DIAGNOSIS — Z6820 Body mass index (BMI) 20.0-20.9, adult: Secondary | ICD-10-CM

## 2015-12-29 DIAGNOSIS — R739 Hyperglycemia, unspecified: Secondary | ICD-10-CM

## 2015-12-29 DIAGNOSIS — L400 Psoriasis vulgaris: Secondary | ICD-10-CM

## 2015-12-29 DIAGNOSIS — M25521 Pain in right elbow: Secondary | ICD-10-CM | POA: Insufficient documentation

## 2015-12-29 DIAGNOSIS — M25511 Pain in right shoulder: Secondary | ICD-10-CM | POA: Insufficient documentation

## 2015-12-29 DIAGNOSIS — M533 Sacrococcygeal disorders, not elsewhere classified: Secondary | ICD-10-CM | POA: Insufficient documentation

## 2015-12-29 DIAGNOSIS — W19XXXA Unspecified fall, initial encounter: Secondary | ICD-10-CM | POA: Insufficient documentation

## 2015-12-29 LAB — CREATINE KINASE (CK), TOTAL, SERUM OR PLASMA: CREATINE KINASE: 63 U/L (ref 25–190)

## 2015-12-29 LAB — SEDIMENTATION RATE
ERYTHROCYTE SEDIMENTATION RATE (ESR): 17 mm/h (ref 0–20)
ERYTHROCYTE SEDIMENTATION RATE (ESR): 17 mm/hr (ref 0–20)

## 2015-12-29 LAB — C-REACTIVE PROTEIN(CRP),INFLAMMATION: CRP INFLAMMATION: <1 mg/L (ref <=8.0)

## 2015-12-29 LAB — GLUCOSE FASTING: GLUCOSE FASTING: 83 mg/dL (ref 70–105)

## 2015-12-29 NOTE — H&P (Addendum)
Requesting Physician: Flint Melter, MD  History of Present Illness  Joyce Hunt is a 35 y.o. female who presents with a chief complaint of Joint Pain  to clinic. Referred for concern for psoriatic arthritis. She states getting the dx of psoriasis 2-3 years ago by dermatology and she has been on Stellara for it which is helping with psoriasis but not the joint pain. Moved from [place]. Has had joint pains since around the time she was diagnosed with psoriasis.  She tried topical creams and MTX for psoriasis before. She states that earlier this year in February, she had a fall on her R side, followed by numbness in her R leg shooting up to her lower spine. She reports R thigh, foot and back of knee swelling. She reports joint pain in her R knee. She reports soft spots in her arm. She denies being told that she has uveitis or achilles tendonitis. She reports morning stiffness for 40 minutes. She reports morning swelling in her fingers but not like sausages. She states having the joint pain and the swelling for 3 years. She states feeling worse and tight after taking a hot shower. She states having another fall in thanksgiving. Exercise helps some with her joint pain. Her pain level is 5\10 on average and today her knee pain is 4\5. She takes Ibuprofen PRN and it helps some with her joint and muscle pain and swelling. She repots being sore to touch. She reports being under stress lately for planning of her wedding.     Past History  Current Outpatient Prescriptions   Medication Sig   . cyclobenzaprine (FLEXERIL) 10 mg Oral Tablet Take 1 Tab (10 mg total) by mouth Three times a day as needed for Muscle spasms   . Ibuprofen (MOTRIN) 600 mg Oral Tablet Take 1 Tab (600 mg total) by mouth Four times a day as needed for Pain   . lidocaine (LIDODERM) 5 % Adhesive Patch, Medicated 1 Patch (700 mg total) by Transdermal route Once per day as needed   . ustekinumab (STELARA) 45 mg/0.5 mL Subcutaneous Syringe 0.5 mL (45 mg  total) by Subcutaneous route Per instructions Every 4 months     Allergies   Allergen Reactions   . Mushrooms [Mushroom Flavor] Itching and Swelling     Past Medical History:   Diagnosis Date   . Arthritis 10/30/2015   . Migraine with aura 10/30/2015   . 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 HX ELECTIVE ABORTION     . HX NO SURGICAL PROCEDURES           Family History  Family Medical History     Problem Relation (Age of Onset)    Arthritis-rheumatoid Paternal Grandmother    Cirrhosis Father    Diabetes Mother, Maternal Aunt    Heart Disease Father    Hypertension Mother    Liver Disease Father    Ovarian Cancer Maternal Grandmother    Psoriasis Maternal Uncle    Stroke Maternal Grandfather            Social History  Social History     Social History   . Marital status: Single     Spouse name: N/A   . Number of children: N/A   . Years of education: N/A     Social History Main Topics   . Smoking status: Former Games developer   . Smokeless tobacco: Never Used  Comment: quit 2010   . Alcohol use Yes      Comment: 2-3 drinks 1-2 times per month   . Drug use: No   . Sexual activity: Yes      Comment: no vaginal intercourse for 2 months, engages in oral copulation     Other Topics Concern   . Not on file     Social History Narrative     Review of Systems  Review of Systems:  [place] is positive. Rest are negative    Constitutional: fevers, drastic weight changes, excessive fatigue, decreased appetite, jaw claudication (pain in jaw while chewing), scalp tenderness, poor/unrefreshed sleep  Eyes: Dryness (feels like something in the eye, just some itching in the lids), hx of uveitis (recurrent red eye not infection or allergies), new vision problems (blurred or double vision) , macular degeneration  Ears, nose, mouth, throat, and face: nosebleeds, sores in mouth, mouth dryness, sores in nose, hair loss, hearing loss, sinusitis/sinus issues, unexplained crumbling teeth/tooth decay   Respiratory: shortness of breath, cough, hemoptysis, hx of pleuritis, hx of pleural effusion, hx of COPD, hx of asthma - hx of smoking  Cardiovascular: unexplained chest pain, hx of pericardial effusion, hx of cardiac disease - stents/heart failure, hx of pericarditis, arrythmias  Gastrointestinal: difficulty swallowing/choking sensation, heart burn, diarrhea, blood in stool, constipation, Irritable bowel syndrome, inflammatory bowel syndrome   Genitourinary: unexplained blood in urine, hx of renal failure,  kidney stones  Integument: photosensitivity, hx of erythema nodosum, rash, hx of psoriasis  Hematologic/lymphatic: hx of blood clots, currently on blood thinners, hx of malignancy, currently on chemotherapy or radiation, hx of radiation, hx of chemotherapy, hx of cytopenias (not related to menses or pregnancy)  Musculoskeletal: as in HPI  Neurological: tingling/numbness, hx of strokes, foot or wrist drop, seizures, weakness, headaches - (migraines)  Behavioral/Psych: depression, anxiety, other psychiatric disorder  Endocrine: hx of diabetes, hx of thyroid disease   Other: pregnancy loss, raynaud's, recurrent infections    Examination  Vitals: BP 118/72  Pulse 74  Ht 1.575 m (5\' 2" )  Wt 49.9 kg (110 lb)  LMP 11/14/2015 (Approximate)  SpO2 100%  BMI 20.12 kg/m2  General: appears in good health, no acute distress  Eyes: Conjunctiva clear. Pupils equal and round.   HENT: No External ear tophi., Pinna without redness or swelling - Oral pooling is good. No sinus tenderness, no oral ulcers   Cardiovascular: Normal. Heart sounds are not distant  Lungs: clear to auscultation bilaterally. No wheezes, no crackles  Abdomen: soft, non-tender and bowel sounds normal   Extremities: no peripheral edema - good pulses  Musculoskeletal:   Joints: Full ROM and no synovitis or pain noted on ROM of the upper or lower extremities except as noted - examined joints ---> hands/wrist/shoulders/elbows/ankles/feet/knees/hips - looked  for nodules and deformities and ROM assessed.  Mass posterior to her R knee, R patellar tendon tenderness, R achilles tendon tenderness  Neck: Full ROM not painful - tenderness   Back : No tenderness along the spine - Schober's 10-14.8 cm, + SI joint tenderness, SLR negative - no paraspinal tenderness  Skin: No rashes or lesions.  Neuromusculoskeletal: Up to exam table without assistance. Proximal muscle strength grossly intact.  Fibromyalgia tender points: diffusely tender chest and shoulder area  Psychiatric: affect normal - answers questions appropriately   Labs/X-rays reviewed:  Admission on 10/22/2015, Discharged on 10/23/2015   Component Date Value Ref Range Status   . SODIUM 10/22/2015 136  136 - 145 mmol/L Final   .  POTASSIUM 10/22/2015 3.7  3.5 - 5.1 mmol/L Final   . CHLORIDE 10/22/2015 104  96 - 111 mmol/L Final   . CO2 TOTAL 10/22/2015 20* 22 - 32 mmol/L Final   . ANION GAP 10/22/2015 12  4 - 13 mmol/L Final   . CALCIUM 10/22/2015 9.9  8.5 - 10.2 mg/dL Final   . GLUCOSE 16/10/9602 98  65 - 139 mg/dL Final   . BUN 54/09/8117 13  8 - 25 mg/dL Final   . CREATININE 14/78/2956 0.83  0.49 - 1.10 mg/dL Final   . BUN/CREA RATIO 10/22/2015 16  6 - 22 Final   . ESTIMATED GFR 10/22/2015 >59  >59 mL/min/1.31m^2 Final   . LIPASE 10/22/2015 58  10 - 80 U/L Final   . ALKALINE PHOSPHATASE 10/22/2015 116  <150 U/L Final   . ALT (SGPT) 10/22/2015 54  <55 U/L Final   . AST (SGOT) 10/22/2015 36  8 - 41 U/L Final   . BILIRUBIN DIRECT 10/22/2015 0.1  <0.3 mg/dL Final   . GGT 21/30/8657 138* 7 - 50 U/L Final   . HIV SCREEN, COMBINED ANTIGEN & ANT* 10/22/2015 Negative  Negative, Indeterminate Final    Call the laboratory for assistance in cases with discordance between screen & confirmation    Note: for patients <2 yrs of age, it is recommended that an "HIV-1 Proviral DNA by PCR" be ordered to rule out the presence of HIV1/2 antigen or antibody from maternal origin.   . WBC 10/22/2015 12.3* 3.5 - 11.0 x10^3/uL Final   . RBC  10/22/2015 4.67  3.63 - 4.92 x10^6/uL Final   . HGB 10/22/2015 14.1  11.2 - 15.2 g/dL Final   . HCT 84/69/6295 40.6  33.5 - 45.2 % Final   . MCV 10/22/2015 87.1  78.0 - 100.0 fL Final   . MCH 10/22/2015 30.2  27.4 - 33.0 pg Final   . MCHC 10/22/2015 34.6  32.5 - 35.8 g/dL Final   . RDW 28/41/3244 11.8* 12.0 - 15.0 % Final   . PLATELETS 10/22/2015 352  140 - 450 x10^3/uL Final   . MPV 10/22/2015 7.6  7.5 - 11.5 fL Final   . SPECIFIC GRAVITY 10/23/2015 1.010  1.005 - 1.030 Final   . GLUCOSE 10/23/2015 Negative  Negative mg/dL Final   . PROTEIN 01/09/7251 30 * Negative mg/dL Final   . BILIRUBIN 66/44/0347 Negative  Negative mg/dL Final   . UROBILINOGEN 10/23/2015 Negative  Negative mg/dL Final   . PH 42/59/5638 5.0  5.0 - 8.0 Final   . BLOOD 10/23/2015 Large* Negative mg/dL Final   . KETONES 75/64/3329 Negative  Negative mg/dL Final   . NITRITE 51/88/4166 Negative  Negative Final   . LEUKOCYTES 10/23/2015 Moderate* Negative WBCs/uL Final   . APPEARANCE 10/23/2015 Cloudy* Clear Final   . COLOR 10/23/2015 Normal (Yellow)  Normal (Yellow) Final   . NEUTROPHIL % 10/22/2015 75  % Final   . LYMPHOCYTE % 10/22/2015 20  % Final   . MONOCYTE % 10/22/2015 1  % Final   . EOSINOPHIL % 10/22/2015 4  % Final   . NEUTROPHIL ABSOLUTE 10/22/2015 9.23* 1.50 - 7.70 x10^3/uL Final   . LYMPHOCYTE ABSOLUTE 10/22/2015 2.46  1.00 - 4.80 x10^3/uL Final   . MONOCYTE ABSOLUTE 10/22/2015 0.12* 0.30 - 1.00 x10^3/uL Final   . EOSINOPHIL ABSOLUTE 10/22/2015 0.49  0.00 - 0.50 x10^3/uL Final   . RBC MORPHOLOGY COMMENT 10/22/2015 Normal   Final   . PLATELET MORPHOLOGY COMMENT 10/22/2015 Normal  Final   . WBC 10/22/2015 12.3  x10^3/uL Final   . TRICHOMONAS 10/23/2015 Absent  Absent Final   . YEAST 10/23/2015 Absent  Absent Final   . CLUE CELLS 10/23/2015 Absent  Absent Final   . NEISSERIA GONORRHEA PCR 10/23/2015 Not Detected  Not Detected Final   . CHLAMYDIA TRACHOMATIS PCR 10/23/2015 Not Detected  Not Detected Final   . WBCS 10/23/2015 56.0* <11.0  /hpf Final   . RBCS 10/23/2015 >182.0* <6.0 /hpf Final   . BACTERIA 10/23/2015 Occasional or less  Occasional or less /hpf Final   . SQUAMOUS EPITHELIAL CELLS 10/23/2015 Occasional or less  Occasional or less /lpf Final   . URINE CULTURE 10/23/2015 <10000 CFU/mL Mixed Commensal Flora   Final   Appointment on 01/23/2015   Component Date Value Ref Range Status   . QUANTIFERON-TB GOLD RESULT 01/23/2015 Negative  Negative Final    No interferon-gamma response to M. tuberculosis antigens  was detected. Infection with M. tuberculosis is unlikely.  A negative result alone does not exclude infection with  M. tuberculosis.        For detailed information regarding test interpretation see:     [email or web address]  Clinical+and+Interpretive/38510   . TB AG MINUS NIL RESULT 01/23/2015 0.00  IU/mL Final   . MITOGEN MINUS NIL RESULT 01/23/2015 >10.00  IU/mL Final   . NIL RESULT 01/23/2015 0.04  IU/mL Final       Test Performed by:  [facility] [address]  [address] [place], [place], [place] [id number]  Laboratory Director: Paul DykesWilliam G. Morice, II, M.D., Ph.D.       Outside records:    Epic records reviewed      Right Tibia-Fibula x-ray performed on Joyce PerchesPriya Hunt on Jan 31, 2015  3:52 PM.     CLINICAL HISTORY: 35 y.o. female with Fall.      2 views were obtained and submitted for interpretation.     FINDINGS: The visualized bones, joints, and soft tissues are unremarkable.  No fracture, destructive lesion or other significant abnormality is identified.      IMPRESSION:  Negative examination.  Right Knee x-ray performed on Joyce Hunt on Jan 31, 2015  3:52 PM.     CLINICAL HISTORY: 35 y.o. female with Fall.      3 views were obtained and submitted for interpretation.     FINDINGS: The visualized bones, joints, and soft tissues are unremarkable.  No fracture, destructive lesion or other significant abnormality is identified.      IMPRESSION:  Negative examination.     Right Hand x-ray  performed on Joyce Hunt on Jan 31, 2015  3:52 PM.     CLINICAL HISTORY: 35 y.o. female with Fall.      3 views were obtained and submitted for interpretation.     FINDINGS: The visualized bones, joints, and soft tissues are unremarkable.  No fracture, destructive lesion or other significant abnormality is identified.      IMPRESSION:  Negative examination.    XR ELBOW RIGHT performed on 12/03/2015 7:39 PM.     REASON FOR EXAM:  W19.Lorne SkeensXXXAMarletta Hunt: Fall, initial encounter  M53.3: Sacral pain  M25.521: Right elbow pain  M25.511: Acute pain of right shoulder     TECHNIQUE: 4 views/4 images submitted for interpretation.     COMPARISON: None     FINDINGS:  The visualized bones, joints, and soft tissues are unremarkable.   No fracture, destructive lesion or other significant abnormality is  identified.  IMPRESSION:  Negative examination.    XR SHOULDER RIGHT performed on 12/03/2015 7:39 PM.     REASON FOR EXAM:  W19.Lorne SkeensMarletta Hunt, initial encounter  M53.3: Sacral pain  M25.521: Right elbow pain  M25.511: Acute pain of right shoulder     TECHNIQUE: 4 views/4 images submitted for interpretation.     COMPARISON: None     FINDINGS:  The visualized bones, joints, and soft tissues are unremarkable.   No fracture, destructive lesion or other significant abnormality is  identified.      IMPRESSION:  Negative examination    XR SACRUM AND COCCYX performed on 12/03/2015 7:39 PM     INDICATION: 35 years old Female; W19.Lorne SkeensMarletta Hunt, initial encounter  M53.3: Sacral pain  M25.521: Right elbow pain  M25.511: Acute pain of right shoulder     TECHNIQUE: 3 views of the pelvis; 3 images     COMPARISON: none      FINDINGS:   There is no pubic diastases or sacroiliac joint disruption. The sacral ala  are symmetric. Both femoroacetabular joints are preserved in normal  anatomic alignment.  No fractures noted involving the sacrum and coccyx.     IMPRESSION:  No acute osseous process.    Diagnosis and Plan:   1. Plaque psoriasis    2. Arthritis       Joyce Hunt is a 35 y.o. female who presents with a chief complaint of joint pain in her R knee with ankle swelling. She has the dx of psoriasis 2-3 years ago by dermatology and she has been on Stellara for it which is helping with psoriasis but not the joint pain. She is here today for the concern of psoriatic arthritis. Morning stiffness for 40 minutes. Morning swelling in her fingers and toes and like sausages in her toes. She is diffusely sore to touch in the upper chest and arms - no dactylitis on exam today. ? Concern for enthesitis based on tenderness at the insertion of the achilles and patellar tendon. ? Fullness at the back of the knee - bakers cyst?   She is under stress lately for planning of her wedding.   On exam she is diffusely tender but no synovitis and Schober of 10-14.5 cm.  I reviewed her X rays reports on the system and no concern. On my read I am concerned about the right SI joint but is not a true SI joint film.  On my read the knee imaging may also have some fluid in it.  Official was negative.  I have sent a message should to Radiology to see if they are able to really review the films.   Her hx is concerning for psoriatic arthritis but no objective evidence of it on exam today to confirm the diagnosis.   Plan:   We will check her inflammatory markers today to check for inflammatory activity.   Ankles and feet X rays today to check for inflammatory  If her workup come back positive for psoriatic arthritis, we will need to stop Stellara and change it for something else to help with her skin and joint pain.   Other option is to add sulfasalazine.  Fibromyalgia is a possibility for her given her diffuse tenderness, fatigue and poor sleep but it is a diagnosis of exclusion. We will check her CK and vit D given her myalgia and fatigue. I recommend her to try aquatic therapy and I provided her with a script for it today.  If she is no better  with the pool therapy the next step will be to get an  SI joint MRI or CT scan to evaluate for sacroiliitis.   If not better we will consider rheumatoid factor and CCP on follow-up.  Clinically features are more concerning for psoriatic arthritis picture given asymmetry and tendon involvement as well as history of psoriasis.  Continue ibuprofen 600 milligrams up to twice a day as needed for pain.   With food.  HEr PCP has given has several refills for this.  RTC at the end of March for results review or sooner if needed.     Orders Placed This Encounter   . XR ANKLES BILAT WEIGHT BEARING   . XR FEET WT BEARING BILATERAL   . SEDIMENTATION RATE   . C-REACTIVE PROTEIN(CRP),INFLAMMATION   . SEDIMENTATION RATE   . C-REACTIVE PROTEIN(CRP),INFLAMMATION   . Cpk (Ck)   . VITAMIN D 25, TOTAL   . AMB CONSULT/REFERRAL PHYSICAL/OCCUPATIONAL THERAPY-EXTERNAL     I am scribing for, and in the presence of, Dr. Sherryll Burger for services provided on 12/29/2015.  Ahmed Marjory Sneddon, SCRIBE     I personally performed the services described in this documentation, as scribed  in my presence, and it is both accurate  and complete.    Consuello Bossier, MD  Discussed with radiology - mild right joint space narrowing and mild sclerosis, which may be degenerative but is also mostly on the iliac side and could be osteitis condensans. I don't see any erosions. This doesn't really strike me as an inflammatory process (ie sacroiliitis), and may just be degenerative, but if there's clinical suspicion, it might be worth following up

## 2016-01-02 ENCOUNTER — Other Ambulatory Visit (HOSPITAL_BASED_OUTPATIENT_CLINIC_OR_DEPARTMENT_OTHER): Payer: Self-pay | Admitting: Rheumatology

## 2016-01-02 LAB — VITAMIN D 25, TOTAL: VITAMIN D, 25OH: 23 ng/mL — ABNORMAL LOW (ref 30–100)

## 2016-01-02 MED ORDER — ERGOCALCIFEROL (VITAMIN D2) 1,250 MCG (50,000 UNIT) CAPSULE
50000.00 [IU] | ORAL_CAPSULE | ORAL | 0 refills | Status: DC
Start: 2016-01-02 — End: 2016-05-20

## 2016-01-02 NOTE — Progress Notes (Signed)
Dear Ms. Chamd,     Vitamin D is on the lower side. I will send a prescription in.     Please get X-rays done when you can. Everything else looked good. I will be out of the office till January 15th    Thank you,  Rhys MartiniHajra Z. Sherryll BurgerShah MD

## 2016-01-03 ENCOUNTER — Encounter (HOSPITAL_BASED_OUTPATIENT_CLINIC_OR_DEPARTMENT_OTHER): Payer: Self-pay | Admitting: Rheumatology

## 2016-01-03 NOTE — Progress Notes (Signed)
Dear Ms. Macaulay,     I talked to the radiologist and I would like to repeat the lower back X-ray (SI joint film) in another 6 months if you are not better.     I hope the physical therapy helps you    Thank you,  Sherisse Fullilove Z. Sherryll BurgerShah MD

## 2016-01-19 NOTE — Telephone Encounter (Signed)
-----   Message from ApplingPriya Oberhaus sent at 01/18/2016  4:52 PM EST -----  Regarding: RE:(No subject)  Contact: [phone]  Good Afternoon Dr. Sherryll BurgerShah,    I had a consult with the physical therapist and she recommends the pool therapy that I will be starting soon next week. I was going to start yesterday but work called so I had to reschedule. I did also get the results from [place] describing what it is with my Illia. I still have our follow up appointment for March scheduled. I do also hope the therapy helps. I have a good feeling about it. Also the area Loraine Leric[place]Mark mentioned is exactly where I fell on my hip and back during thanksgiving. I'm just concerned about the words "degenerative" and "Sclorosis", I just don't want this to get worst and if I have to continue with the therapy I will do it. I want to continue dancing and so I would like to get back into it without making things worst. I do hope you had a great holiday and got to relax. Thank you so much and please let me know about when you need me to do the xray. Have a great day.    P.S: I tried doing some cardio yesterday and when I woke up today my lower back is sore as well. Right where I fell.    -Joyce C.  ----- Message -----  From: Consuello BossierHajra Zehra Shah, MD  Sent: 01/03/2016 11:32 AM EST  To: Joyce PerchesPriya Hunt  Subject: (No subject)    Dear Joyce Hunt,     I talked to the radiologist and I would like to repeat the lower back X-ray (SI joint film) in another 6 months if you are not better.     I hope the physical therapy helps you    Thank you,  Hajra Z. Sherryll BurgerShah MD

## 2016-03-07 DIAGNOSIS — R748 Abnormal levels of other serum enzymes: Secondary | ICD-10-CM

## 2016-03-07 HISTORY — DX: Abnormal levels of other serum enzymes: R74.8

## 2016-03-08 ENCOUNTER — Telehealth (HOSPITAL_BASED_OUTPATIENT_CLINIC_OR_DEPARTMENT_OTHER): Payer: Self-pay | Admitting: Rheumatology

## 2016-03-08 NOTE — Telephone Encounter (Signed)
Routing to provider.  Cyndia SkeetersKayisha Shareece Bultman, MA  03/08/2016, 08:14

## 2016-03-08 NOTE — Telephone Encounter (Signed)
Please call patient  I have openings today  Please schedule

## 2016-03-08 NOTE — Telephone Encounter (Signed)
Attempted to contact pt. There was no answer. Left detailed message advising pt that Dr. Sherryll BurgerShah has openings today to please call back if she would like an appt.  Cyndia SkeetersKayisha Ritika Hellickson, MA  03/08/2016, 09:31

## 2016-03-08 NOTE — Telephone Encounter (Signed)
----- Message from Carylon PerchesPriya Fairclough sent at 03/07/2016  9:22 PM EST -----  Regarding: RE:(No subject)  Contact: [phone]  Good Afternoon:    I received your email and I was wondering if you need me to come in earlier than my scheduled appointment for the x-ray as mentioned. I'm still feeling some pain in the back by the lower right side also. The pool therapy has been helping, I've been trying to be consistent throughout the week with the appointments. It has been helping with the swelling and some of the joint pains when I walk down the hill. The last time we did more stretching techniques in the water and that's what helped with the pain. I can't seem to sit for too long in the same spot as well without that lower part of my back hurting back up a bit. It's not as bad but I do feel it. Please let me know about the appointment also. Thank you so much. I also have been taking the Vitamin D as prescribed. Thank you so much and hope to hear from you soon.    -Joyce C.  ----- Message -----  From: Benard RinkErika M Poling, APRN,NP-C  Sent: 01/19/2016  8:08 AM EST  To: Carylon PerchesPriya Filter  Subject: RE:(No subject)    Ms. Bucholz--    Dr. Sherryll BurgerShah is out of the office until next week.  In reviewing notes--it looks she was going to consider repeating back xray in 6 months (from December) if symptoms had not improved.  Good luck with PT--hopefully this will be helpful.   Again-- Dr. Sherryll BurgerShah will be back next week if you have further concerns/questions for her.     Please let us know if you have questions.     Thank you,  Shanda HowellsErika Poling, APRN  Family Nurse Practitioner-C  Rheumatology          ----- Message -----     From: Carylon PerchesPriya Mcilhenny     Sent: 01/18/2016  4:52 PM EST       To: Consuello BossierHajra Zehra Shah, MD  Subject: RE:(No subject)    Good Afternoon Dr. Sherryll BurgerShah,    I had a consult with the physical therapist and she recommends the pool therapy that I will be starting soon next week. I was going to start yesterday but work called so I had to reschedule. I did also get  the results from [place] describing what it is with my Illia. I still have our follow up appointment for March scheduled. I do also hope the therapy helps. I have a good feeling about it. Also the area Loraine L[place] mentioned is exactly where I fell on my hip and back during thanksgiving. I'm just concerned about the words "degenerative" and "Sclorosis", I just don't want this to get worst and if I have to continue with the therapy I will do it. I want to continue dancing and so I would like to get back into it without making things worst. I do hope you had a great holiday and got to relax. Thank you so much and please let me know about when you need me to do the xray. Have a great day.    P.S: I tried doing some cardio yesterday and when I woke up today my lower back is sore as well. Right where I fell.    -Yashica C.  ----- Message -----  From: Consuello BossierHajra Zehra Shah, MD  Sent: 01/03/2016 11:32 AM EST  To: Carylon PerchesPriya Zaino  Subject: (No subject)  Dear Ms. Macaulay,     I talked to the radiologist and I would like to repeat the lower back X-ray (SI joint film) in another 6 months if you are not better.     I hope the physical therapy helps you    Thank you,  Hajra Z. Sherryll BurgerShah MD

## 2016-03-11 ENCOUNTER — Ambulatory Visit: Payer: BLUE CROSS/BLUE SHIELD | Attending: Rheumatology | Admitting: Rheumatology

## 2016-03-11 ENCOUNTER — Encounter (HOSPITAL_BASED_OUTPATIENT_CLINIC_OR_DEPARTMENT_OTHER): Payer: Self-pay | Admitting: Rheumatology

## 2016-03-11 VITALS — BP 112/62 | HR 95 | Temp 97.5°F | Ht 62.0 in | Wt 125.4 lb

## 2016-03-11 DIAGNOSIS — L409 Psoriasis, unspecified: Secondary | ICD-10-CM

## 2016-03-11 DIAGNOSIS — M545 Low back pain: Secondary | ICD-10-CM | POA: Insufficient documentation

## 2016-03-11 DIAGNOSIS — Z6822 Body mass index (BMI) 22.0-22.9, adult: Secondary | ICD-10-CM

## 2016-03-11 DIAGNOSIS — Z79899 Other long term (current) drug therapy: Secondary | ICD-10-CM | POA: Insufficient documentation

## 2016-03-11 DIAGNOSIS — Z72821 Inadequate sleep hygiene: Secondary | ICD-10-CM | POA: Insufficient documentation

## 2016-03-11 DIAGNOSIS — L4050 Arthropathic psoriasis, unspecified: Secondary | ICD-10-CM | POA: Insufficient documentation

## 2016-03-11 DIAGNOSIS — M549 Dorsalgia, unspecified: Secondary | ICD-10-CM

## 2016-03-11 DIAGNOSIS — Z791 Long term (current) use of non-steroidal anti-inflammatories (NSAID): Secondary | ICD-10-CM | POA: Insufficient documentation

## 2016-03-11 NOTE — Progress Notes (Signed)
[facility]  [facility]  Return Patient Note     Date:   03/11/2016  Name: Joyce Hunt  Age: 36 y.o.    Chief Complaint: Psoriasis    Subjective:  Joyce Perchesriya Gengler is a 36 y.o. female with history of psoriasis and concern for sacroiliitis who presents for routine follow-up.  She takes Stelara every 4 months for her psoriasis and is about due for a dose.  At last visit she was started on pool physical therapy, which she states has been helping a great deal.  She also continues on the same dose of ibuprofen she had been (1x 600 mg tab every 4-5 hours ) which she states continues to help.  Today she states that she is feeling better than she had before  therapy, but states that with prolonged sitting she continues to have exacerbations of her lower right back pain (which has started since she had a fall landing on some steps onto the right side around Thanksgiving).  She also states that her bilateral elbows and knees (right greater than left) continue to pain her but of much less level since before therapy.  She denies having any red, hot, or swollen joints.  She states that her morning stiffness as around 20 min.  She denies having any large breakouts of psoriatic plaques since before starting treatment around 3 years ago, and she denies any other skin changes.  She further denies any hair loss, dry eyes, dry mouth, sores in mouth.  She does however endorse symptoms consistent with Raynaud's.  She also endorses having poor sleep.  She states that this is due to being awakened nightly due to her back pain.    Current Outpatient Prescriptions   Medication Sig    cyclobenzaprine (FLEXERIL) 10 mg Oral Tablet Take 1 Tab (10 mg total) by mouth Three times a day as needed for Muscle spasms    ergocalciferol, vitamin D2, (DRISDOL) 50,000 unit Oral Capsule Take 1 Cap (50,000 Units total) by mouth Every 7 days    Ibuprofen (MOTRIN) 600 mg Oral Tablet Take 1 Tab (600 mg total) by mouth Four times a day as  needed for Pain    lidocaine (LIDODERM) 5 % Adhesive Patch, Medicated 1 Patch (700 mg total) by Transdermal route Once per day as needed    ustekinumab (STELARA) 45 mg/0.5 mL Subcutaneous Syringe 0.5 mL (45 mg total) by Subcutaneous route Per instructions Every 4 months       Objective:  BP 112/62   Pulse 95   Temp 36.4 C (97.5 F) (Tympanic)    Ht 1.575 m (5\' 2" )   Wt 56.9 kg (125 lb 7.1 oz)   SpO2 99%   BMI 22.94 kg/m2  General: appears in good health, well developed and no distress  Eyes: Pupils equal and round. No eye injection  HENT: mucous membranes moist, no oral ulcers   Neck: supple, symmetrical, trachea midline  Lungs: Clear to auscultation bilaterally.   Cardiovascular: regular rate and rhythm, S1, S2 normal, no murmur, click, rub or gallop  Abdomen: Soft, non-tender, Bowel sounds normal, non-distended  Extremities: extremities normal, atraumatic, no cyanosis or edema, mild tenderness to medial/lateral epicondyles of R elbow w/o synovitis or redness/swelling, TTP to R SI joint, FABER + R   Skin: Skin warm and dry, No rashes and No lesions, no plaques   Neurologic: Grossly normal, CN II - XII grossly intact , Alert and oriented x3  Psychiatric: Normal affect, behavior, memory, thought content,  judgement, and speech.      Data reviewed:    Assessment and Plan  1. Back pain, unspecified back location, unspecified back pain laterality, unspecified chronicity    2. Long term (current) use of non-steroidal anti-inflammatories (nsaid)    3. Psoriasis        Psoriasis with concern for sacroiliitis  - will proceed with MRI SI joints; patient instructed to hold any NSAIDs 3 days prior to imaging as a precaution to ensure best imaging  possible  - after better evaluation with imaging will determine if patient needs to have her Stelara upgraded to a new agent better suited to treatment of psoriatic arthritis such as Humira or Enbrel  -Will also consider switching her ibuprofen to diclofenac after the results of  her MRI  - F/U 3 months     Orders Placed This Encounter    MRI PELVIS WO CONTRAST       Vista Deck, MD  03/11/16 18:05   Internal Medicine, PGY-2  [facility]  Pager # [phone]        I saw and examined the patient.  I reviewed the resident's note.  I agree with the findings and plan of care as documented in the resident's note.  Any exceptions/additions are edited/noted. Patient seen and examined with resident.  History of psoriasis on Stelara.  Back pain is concerning for inflammatory  Inflammatory process. Does have SI joint tenderness on exam.  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  She has not used any other DMARDs other than topicals for her psoriasis.  Stelara was chosen over TNFs given side effect profile.  At this time I believe that an MRI of the pelvis should be considered.  Patient has completed at least for 6 weeks of therapy.  This has been helpful but has not taken care of the pain all the way.  Consider changing to diclofenac in the future.  Encouraged to do daily stretches.   Hold NSAID 3 days before MRI pelvis.   Will see her back after MRI pelvis.    Consuello Bossier, MD

## 2016-03-11 NOTE — Progress Notes (Deleted)
Patient seen and examined with resident.  History of psoriasis on Stelara.  Back pain is concerning for inflammatory  Inflammatory process. Does have SI joint tenderness on exam.  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  She has not used any other DMARDs other than topicals for her psoriasis.  Stelara was chosen over TNFs given side effect profile.  At this time I believe that an MRI of the pelvis should be considered.  Patient has completed at least for 6 weeks of therapy.  This has been helpful but has not taken care of the pain all the way.  Consider changing to diclofenac in the future.  Encouraged to do daily stretches.   Hold NSAID 3 days before MRI pelvis.   Will see her back after MRI pelvis.

## 2016-03-19 ENCOUNTER — Encounter (HOSPITAL_BASED_OUTPATIENT_CLINIC_OR_DEPARTMENT_OTHER): Payer: Self-pay | Admitting: DERMATOLOGY

## 2016-03-19 ENCOUNTER — Ambulatory Visit (HOSPITAL_BASED_OUTPATIENT_CLINIC_OR_DEPARTMENT_OTHER): Payer: BLUE CROSS/BLUE SHIELD

## 2016-03-19 ENCOUNTER — Ambulatory Visit: Payer: BLUE CROSS/BLUE SHIELD | Attending: DERMATOLOGY | Admitting: DERMATOLOGY

## 2016-03-19 VITALS — Temp 98.2°F | Ht 61.3 in | Wt 129.2 lb

## 2016-03-19 DIAGNOSIS — Z791 Long term (current) use of non-steroidal anti-inflammatories (NSAID): Secondary | ICD-10-CM | POA: Insufficient documentation

## 2016-03-19 DIAGNOSIS — L400 Psoriasis vulgaris: Secondary | ICD-10-CM

## 2016-03-19 DIAGNOSIS — Z6824 Body mass index (BMI) 24.0-24.9, adult: Secondary | ICD-10-CM

## 2016-03-19 DIAGNOSIS — Z840 Family history of diseases of the skin and subcutaneous tissue: Secondary | ICD-10-CM | POA: Insufficient documentation

## 2016-03-19 DIAGNOSIS — M1990 Unspecified osteoarthritis, unspecified site: Secondary | ICD-10-CM | POA: Insufficient documentation

## 2016-03-19 DIAGNOSIS — Z79899 Other long term (current) drug therapy: Secondary | ICD-10-CM | POA: Insufficient documentation

## 2016-03-19 LAB — CBC WITH DIFF
BASOPHIL #: 0.02 x10ˆ3/uL (ref 0.00–0.20)
BASOPHIL %: 0 %
EOSINOPHIL #: 0.09 x10ˆ3/uL (ref 0.00–0.50)
HCT: 37.5 % (ref 33.5–45.2)
HGB: 13 g/dL (ref 11.2–15.2)
LYMPHOCYTE #: 3.02 x10ˆ3/uL (ref 1.00–4.80)
LYMPHOCYTE %: 41 %
MCH: 30.6 pg (ref 27.4–33.0)
MCHC: 34.6 g/dL (ref 32.5–35.8)
MCV: 88.3 fL (ref 78.0–100.0)
MONOCYTE #: 0.54 x10ˆ3/uL (ref 0.30–1.00)
MPV: 8 fL (ref 7.5–11.5)
NEUTROPHIL #: 3.78 x10ˆ3/uL (ref 1.50–7.70)
NEUTROPHIL %: 51 %
PLATELETS: 319 x10?3/uL (ref 140–450)
RBC: 4.24 x10ˆ6/uL (ref 3.63–4.92)
RDW: 12.4 % (ref 12.0–15.0)
WBC: 7.5 x10?3/uL (ref 3.5–11.0)

## 2016-03-19 LAB — AST (SGOT): AST (SGOT): 79 U/L — ABNORMAL HIGH (ref 8–41)

## 2016-03-19 MED ORDER — CLOBETASOL 0.05 % SCALP SOLUTION: mL | Freq: Two times a day (BID) | 1 refills | 0 days | Status: DC

## 2016-03-19 NOTE — Progress Notes (Signed)
[facility], [facility]  [address]  [place] [id number]  [phone]    Date:   03/19/2016  Name: Joyce Hunt  Age: 36 y.o.    Chief complaint: Skin Check    HPI  Joyce Hunt is a 36 y.o. female who presents for psoriasis. The patient is unsure of when she had her last Tb test and has not recently had labs. She last had a dose of Stelara in 06/2015 which she received in clinic. She reports that her skin is clear but has recently started feeling itchy, especially in her scalp. She denies flaking. She would like to be back on Stelara. The patient otherwise denies any new, changing, bleeding, or rapidly growing lesions and has no other skin-related complaints. Family history of psoriasis in maternal uncle.    Review of Systems   Constitutional: Negative for fever.   Cardiovascular: Negative for chest pain.   Gastrointestinal: Negative for diarrhea.   Skin: Positive for itching. Negative for rash.     Current Medications  . clobetasol (CORMAX) 0.05 % Solution by Apply Topically route Twice daily   . cyclobenzaprine (FLEXERIL) 10 mg Oral Tablet Take 1 Tab (10 mg total) by mouth Three times a day as needed for Muscle spasms   . ergocalciferol, vitamin D2, (DRISDOL) 50,000 unit Oral Capsule Take 1 Cap (50,000 Units total) by mouth Every 7 days   . Ibuprofen (MOTRIN) 600 mg Oral Tablet Take 1 Tab (600 mg total) by mouth Four times a day as needed for Pain   . lidocaine (LIDODERM) 5 % Adhesive Patch, Medicated 1 Patch (700 mg total) by Transdermal route Once per day as needed   . ustekinumab (STELARA) 45 mg/0.5 mL Subcutaneous Syringe 0.5 mL (45 mg total) by Subcutaneous route Per instructions Every 4 months     Allergies   Allergen Reactions   . Mushrooms [Mushroom Flavor] Itching and Swelling     Past Medical History:   Diagnosis Date   . Arthritis 10/30/2015   . Migraine with aura 10/30/2015   . 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  Physical Exam   Constitutional: She is oriented to person, place, and time. She appears well-developed and well-nourished. No distress.   HENT:   Head: Normocephalic and atraumatic.   Neurological: She is alert and oriented to person, place, and time.   Skin: Skin is warm and dry. She is not diaphoretic.        Psychiatric: She has a normal mood and affect.      Assessment and Plan  Problem List Items Addressed This Visit        Dermatology    Plaque psoriasis - Primary    Relevant Orders    MYCOBACTERIUM TUBERCULOSIS INFECTION DETERMINATION BY QUANTIFERON-TB G    Cbc/Diff    Ast (Sgot)        Plan    Check quantiferon, CBC/Diff, and AST today.   Start clobetasol 0.05% solution to scalp BID PRN  Will recheck in 3 months. Can consider re-starting Stelara if she develops any plaques.   She will call if she gets any new plaques.      Follow  The patient was educated on the importance of avoiding excessive sun exposure and wearing sunscreen daily. Advised patient to re-apply  sunscreen every 2-3 hours. Advised the patient to avoid going to the tanning beds. Advised to check skin routinely for any changes, especially any new moles or changes in existing moles.      I am scribing for, and in the presence of, Dr. Lorenso Courier for services provided on 03/19/2016.  Morgan Stanley, SCRIBE   I personally performed the services described in this documentation, as scribed  in my presence, and it is both accurate  and complete.    Miles Costain, MD

## 2016-03-21 ENCOUNTER — Other Ambulatory Visit (HOSPITAL_BASED_OUTPATIENT_CLINIC_OR_DEPARTMENT_OTHER): Payer: Self-pay | Admitting: Rheumatology

## 2016-03-21 NOTE — Telephone Encounter (Signed)
Can use 2000 IU daily OTC now  Thank you

## 2016-03-21 NOTE — Telephone Encounter (Signed)
Last labs 03/19/16  Last office visit 03/11/16  F/u visit 03/28/16  Cyndia SkeetersKayisha Breindel Collier, MA  03/21/2016, 07:13

## 2016-03-21 NOTE — Telephone Encounter (Signed)
Left detailed message advising pt of the providers note below. Asked for a return call with any questions.  Cyndia SkeetersKayisha Kirkland Figg, MA  03/21/2016, 13:32

## 2016-03-22 ENCOUNTER — Encounter (HOSPITAL_BASED_OUTPATIENT_CLINIC_OR_DEPARTMENT_OTHER): Payer: Self-pay | Admitting: DERMATOLOGY

## 2016-03-22 LAB — MYCOBACTERIUM TUBERCULOSIS INFECTION DETERMINATION BY QUANTIFERON-TB G
MITOGEN MINUS NIL RESULT: 11.7 IU/mL
NIL RESULT: 0.02 IU/mL
QUANTIFERON, QUALITATIVE: NEGATIVE
TB AG MINUS NIL RESULT: 0 IU/mL

## 2016-03-23 ENCOUNTER — Encounter (HOSPITAL_BASED_OUTPATIENT_CLINIC_OR_DEPARTMENT_OTHER): Payer: Self-pay | Admitting: Rheumatology

## 2016-03-23 NOTE — Progress Notes (Signed)
Dear Ms. Garramone,     I hope you are well. I see that you are scheduled for an MRI. I just wanted to remind you to get the feet and ankle X-rays when you can  They are in the system since December 2017. You can complete them the same day as the MRI.   Remember to hold all pain medications except tylenol products at least 3 days or more before the MRI    No change in plan otherwise.   Please let me know if you have any other questions or concerns.   Follow up as discussed in clinic.     Thank you for allowing us to participate in your care.     Sincerely,   Humberto LeepHajra Z Noam Karaffa MD  Rheumatology

## 2016-03-25 ENCOUNTER — Telehealth (HOSPITAL_BASED_OUTPATIENT_CLINIC_OR_DEPARTMENT_OTHER): Payer: Self-pay | Admitting: DERMATOLOGY

## 2016-03-25 ENCOUNTER — Other Ambulatory Visit (HOSPITAL_BASED_OUTPATIENT_CLINIC_OR_DEPARTMENT_OTHER): Payer: Self-pay | Admitting: DERMATOLOGY

## 2016-03-25 DIAGNOSIS — L400 Psoriasis vulgaris: Secondary | ICD-10-CM

## 2016-03-25 MED ORDER — USTEKINUMAB 45 MG/0.5 ML SUBCUTANEOUS SYRINGE
45.0000 mg | INJECTION | SUBCUTANEOUS | 0 refills | Status: DC
Start: 2016-03-25 — End: 2016-07-24

## 2016-03-25 MED FILL — Stelara 45 Mg/0.5ml Pfs: 84 days supply | Qty: 1 | Fill #0 | Status: CP

## 2016-03-25 NOTE — Telephone Encounter (Signed)
Spoke with pt regarding Stelara was called into her pharmacy as per requested. Made her aware that she needed to repeat labs in a week and the orders are in the system. Pt verbalized understanding.  Joyce Hunt Everlena CooperJo Hilaria Titsworth, LPN

## 2016-03-25 NOTE — Telephone Encounter (Signed)
Left message for pt to call back to office regarding Dr. Lorenso CourierPowers sent in pt Stelara to pharmacy. Pt needs to have labs repeated in a week due to liver enzyme elevated.   Joyce Hunt Everlena CooperJo Katsumi Wisler, LPN

## 2016-03-26 ENCOUNTER — Ambulatory Visit
Admission: RE | Admit: 2016-03-26 | Discharge: 2016-03-26 | Disposition: A | Discharge status: Home / Self Care | Payer: BLUE CROSS/BLUE SHIELD | Source: Ambulatory Visit | Attending: Rheumatology | Admitting: Rheumatology

## 2016-03-26 ENCOUNTER — Other Ambulatory Visit (INDEPENDENT_AMBULATORY_CARE_PROVIDER_SITE_OTHER): Payer: Self-pay | Admitting: Pharmacist

## 2016-03-26 DIAGNOSIS — M549 Dorsalgia, unspecified: Principal | ICD-10-CM

## 2016-03-26 NOTE — Telephone Encounter (Signed)
Specialty Pharmacy Clinical Program Opt-Out Note    Treatment Regimen: Stelara 45 mg every 12 weeks  Indication: Psoraisis    Contacted patient to offer Stelara education for Psoriasis; however, patient declined. Patient states they are familiar with medication and reported no issues. She said she gets her injections done in the clinic and they provide her with education and answer all of her questions. She denies side effects at this time and states the stelara works well to control her psoriasis.      Medication list & allergies reviewed and list updated.     Advised patient to contact [facility] [phone]) with future questions/concerns.    Flo ShanksRachel Charlane Westry  03/26/2016, 16:25

## 2016-03-27 NOTE — Progress Notes (Signed)
Dear Ms. Velarde,     Your back imaging did not show any sacroiliitis or evidence of psoriatic arthritis which is reassuring. We will discuss more on follow up tomorrow and see where to go from here.    No change in plan otherwise.   Please let me know if you have any other questions or concerns.   Follow up as discussed in clinic.     Thank you for allowing us to participate in your care.     Sincerely,   Humberto LeepHajra Z Hades Mathew MD  Rheumatology

## 2016-03-28 ENCOUNTER — Encounter (HOSPITAL_BASED_OUTPATIENT_CLINIC_OR_DEPARTMENT_OTHER): Payer: Self-pay

## 2016-03-28 ENCOUNTER — Encounter (HOSPITAL_BASED_OUTPATIENT_CLINIC_OR_DEPARTMENT_OTHER): Payer: Self-pay | Admitting: Rheumatology

## 2016-03-28 ENCOUNTER — Ambulatory Visit (HOSPITAL_BASED_OUTPATIENT_CLINIC_OR_DEPARTMENT_OTHER): Payer: BLUE CROSS/BLUE SHIELD

## 2016-03-28 ENCOUNTER — Encounter (HOSPITAL_BASED_OUTPATIENT_CLINIC_OR_DEPARTMENT_OTHER): Payer: BLUE CROSS/BLUE SHIELD | Admitting: Rheumatology

## 2016-03-28 ENCOUNTER — Ambulatory Visit: Payer: BLUE CROSS/BLUE SHIELD | Attending: DERMATOLOGY

## 2016-03-28 DIAGNOSIS — L400 Psoriasis vulgaris: Secondary | ICD-10-CM

## 2016-03-28 DIAGNOSIS — L409 Psoriasis, unspecified: Secondary | ICD-10-CM | POA: Insufficient documentation

## 2016-03-28 LAB — AST (SGOT): AST (SGOT): 26 U/L (ref 8–41)

## 2016-03-28 LAB — ALT (SGPT)
ALT (SGPT): 42 U/L (ref <55)
ALT (SGPT): 42 U/L (ref <55)

## 2016-03-28 NOTE — Progress Notes (Signed)
Patient came in to clinic for Stelara maintenance injection, (3rd injection) per Dr.Tyniya Kuyper. Patient brought medication with her and it was administered to her left posterior deltoid. Patient denied any adverse reactions in the past. Spoke with Corky CraftsLindsay Adkins RN, patient did not need to wait the 20 minutes after injection. Patient states that she "feels well and is going to work after this." I instructed her to call clinic back to reschedule for a nursing visit when she is due for next injection. Burnice LoganJessica A Martin, LPN  Miles Costainoxann L Elenna Spratling, MD

## 2016-03-28 NOTE — Nursing Note (Signed)
03/28/16 1400   Medication Administration   Initials jm   Medication  (Stelara)   Medication Dose 45mg /0.545ml   Route of Administration SQ   Site (Left posterior-lateral deltoid )   NDC # [id number]   LOT # [id number]   Expiration date 10/06/16   Man[facility]   Clinic Supplied No   Patient Supplied Yes

## 2016-03-28 NOTE — Progress Notes (Signed)
Ms. Merrily PewChand was a no show today

## 2016-03-29 ENCOUNTER — Telehealth (HOSPITAL_BASED_OUTPATIENT_CLINIC_OR_DEPARTMENT_OTHER): Payer: Self-pay | Admitting: Rheumatology

## 2016-03-29 ENCOUNTER — Encounter (HOSPITAL_BASED_OUTPATIENT_CLINIC_OR_DEPARTMENT_OTHER): Payer: Self-pay | Admitting: Rheumatology

## 2016-03-29 NOTE — Telephone Encounter (Signed)
Called patient left detailed message about appointment and xray orders. Explained I will go ahead and put her on the schedule. Asked her to call back with any questions or to cancel if she can't make it.

## 2016-03-29 NOTE — Telephone Encounter (Signed)
No problem  I have an opening today I believe if you would like to come in  Please complete feet and ankle X-rays at the hospital, [facility] or [facility]  I can review them at our visit.     Thank you,  Ketsia Linebaugh Z. Sherryll BurgerShah MD

## 2016-03-29 NOTE — Telephone Encounter (Signed)
-----   Message from [facility] Eddings sent at 03/28/2016  8:46 PM EDT -----  Regarding: RE:(No subject)  Contact: [phone]  Good Evening Dr. Sherryll BurgerShah:    I'm actually glad I have you as my doctor. I went in for the MRI but I'm not sure they did an xray of my feet and ankle. I think it was just the lower back. I would like to get those done as well if I need to. Please let me know how I can go about that. If there is anything else you need me to do please let me know. Thank you so much.     -Ethelmae C.  ----- Message -----  From: Consuello BossierHajra Zehra Shah, MD  Sent: 03/23/2016  2:02 PM EDT  To: Carylon PerchesPriya Olkowski  Subject: (No subject)    Dear Ms. Blackerby,     I hope you are well. I see that you are scheduled for an MRI. I just wanted to remind you to get the feet and ankle X-rays when you can  They are in the system since December 2017. You can complete them the same day as the MRI.   Remember to hold all pain medications except tylenol products at least 3 days or more before the MRI    No change in plan otherwise.   Please let me know if you have any other questions or concerns.   Follow up as discussed in clinic.     Thank you for allowing us to participate in your care.     Sincerely,   Humberto LeepHajra Z Shah MD  Rheumatology

## 2016-04-01 ENCOUNTER — Ambulatory Visit
Admission: RE | Admit: 2016-04-01 | Discharge: 2016-04-01 | Disposition: A | Discharge status: Home / Self Care | Payer: BLUE CROSS/BLUE SHIELD | Source: Ambulatory Visit | Attending: Rheumatology | Admitting: Rheumatology

## 2016-04-01 ENCOUNTER — Encounter (HOSPITAL_BASED_OUTPATIENT_CLINIC_OR_DEPARTMENT_OTHER): Payer: BLUE CROSS/BLUE SHIELD | Admitting: Rheumatology

## 2016-04-01 ENCOUNTER — Ambulatory Visit (HOSPITAL_BASED_OUTPATIENT_CLINIC_OR_DEPARTMENT_OTHER): Payer: BLUE CROSS/BLUE SHIELD

## 2016-04-01 DIAGNOSIS — L400 Psoriasis vulgaris: Secondary | ICD-10-CM

## 2016-04-01 DIAGNOSIS — M1990 Unspecified osteoarthritis, unspecified site: Secondary | ICD-10-CM

## 2016-04-01 NOTE — Progress Notes (Signed)
Dear Ms. Mier,     Your x-rays look unremarkable.  I am sorry that I was not able to accommodate you today.  We will see you as scheduled.  Please do call for earlier cancellations at your convenience.  Please continue your physical therapy.    No change in plan otherwise.   Please let me know if you have any other questions or concerns.   Follow up as discussed in clinic.     Thank you for allowing us to participate in your care.     Sincerely,   Humberto LeepHajra Z Cai Flott MD  Rheumatology

## 2016-05-10 ENCOUNTER — Ambulatory Visit (HOSPITAL_BASED_OUTPATIENT_CLINIC_OR_DEPARTMENT_OTHER): Payer: Self-pay | Admitting: DERMATOLOGY

## 2016-05-10 NOTE — Telephone Encounter (Signed)
Pt has called several times with this request.  Pt must call medical records or come in and sign a release. Lance MussLindsay S Madasyn Heath, RN  05/10/2016, 13:47

## 2016-05-10 NOTE — Telephone Encounter (Signed)
-----   Message from Micah NoelLinda Ann Juhl sent at 05/10/2016  1:33 PM EDT -----  Miles CostainPowers, Roxann L, MD     Pt needs TB test results to be faxed to Employee health. Pt requesting a copy be mailed to her as well. Please call to advise.

## 2016-05-20 ENCOUNTER — Ambulatory Visit: Payer: BLUE CROSS/BLUE SHIELD | Attending: Rheumatology | Admitting: Rheumatology

## 2016-05-20 ENCOUNTER — Encounter (HOSPITAL_BASED_OUTPATIENT_CLINIC_OR_DEPARTMENT_OTHER): Payer: Self-pay | Admitting: Rheumatology

## 2016-05-20 VITALS — BP 118/62 | HR 104 | Temp 98.2°F | Ht 62.0 in | Wt 124.3 lb

## 2016-05-20 DIAGNOSIS — L409 Psoriasis, unspecified: Secondary | ICD-10-CM | POA: Insufficient documentation

## 2016-05-20 DIAGNOSIS — Z791 Long term (current) use of non-steroidal anti-inflammatories (NSAID): Secondary | ICD-10-CM | POA: Insufficient documentation

## 2016-05-20 DIAGNOSIS — M799 Soft tissue disorder, unspecified: Secondary | ICD-10-CM | POA: Insufficient documentation

## 2016-05-20 DIAGNOSIS — Z6822 Body mass index (BMI) 22.0-22.9, adult: Secondary | ICD-10-CM

## 2016-05-20 DIAGNOSIS — Z79899 Other long term (current) drug therapy: Secondary | ICD-10-CM | POA: Insufficient documentation

## 2016-05-20 DIAGNOSIS — M549 Dorsalgia, unspecified: Secondary | ICD-10-CM

## 2016-05-20 DIAGNOSIS — M545 Low back pain: Secondary | ICD-10-CM | POA: Insufficient documentation

## 2016-05-20 NOTE — Progress Notes (Signed)
Subjective  Joyce Hunt is a 36 y.o. year old female who presents for follow-up of her back pain to clinic. She reports about 50% improvement of her symptoms in last  2 months since starting PT that includes pool therapy and stretching. She currently takes about 4 tablets a week of Ibuprofen on PRN basis. She recently had MRI pelvis done that was negative for inflammatory SI joint arthritis. She is currently on Stelara and her last dose was in 03/2016.Marland Kitchen. She has had 3 doses so far. Her psoriasis skin lesions have been in remission since then.   She also complains of right popliteal fossa fullness and stretching sensation that started after right knee injury last year, and has been intermittent, and improves somewhat with PT.   She denies any new symtoms and rest of ROS has been pretty unremarkable.     Current Outpatient Prescriptions   Medication Sig   . cholecalciferol, vitamin D3, 1,000 unit Oral Tablet Take 2,000 Units by mouth Once a day   . clobetasol (CORMAX) 0.05 % Solution by Apply Topically route Twice daily   . cyclobenzaprine (FLEXERIL) 10 mg Oral Tablet Take 1 Tab (10 mg total) by mouth Three times a day as needed for Muscle spasms   . Ibuprofen (MOTRIN) 600 mg Oral Tablet Take 1 Tab (600 mg total) by mouth Four times a day as needed for Pain   . lidocaine (LIDODERM) 5 % Adhesive Patch, Medicated 1 Patch (700 mg total) by Transdermal route Once per day as needed   . ustekinumab (STELARA) 45 mg/0.5 mL Subcutaneous Syringe 0.5 mL (45 mg total) by Subcutaneous route Per instructions Every 4 months       Objective  General:No acute distress; alert and awake  HEENT:Normocephalic and atraumatic; no bleeding or deformity.  Neck : No JVD, lymphadenopathy.  Heart:Regular rate and rhythm without murmurs, rubs, or gallops  Lungs: Clear without coughing, crackling, or wheezing  Abdomen:Soft; bowel sounds present.  Musculoskeletal: No joint swelling., SI joint tenderness +  Skin:Warm and dry; no rash, erythema.   Extremities: Right popliteal fossa fullness.   Lymphatic: No lymphadenopathy.    Assessment/Plan:    - Lower back pain, sacroiliitis ruled out on MRI: Her recent MRI pelvis is negative. Her Xray of hands and feet are normal. She takes about 4 Ibuprofen tablets a week on PRN basis. Significant relief of symptoms with PT including the pool therapy and exercises. Plan is to continue it for as long as needed. If her symptoms are still present in 1 year or get worse at any time , we can see her back in the clinic.     - Psoriasis- Well controlled on Stelara, will continue it.     -Right knee popliteal fossa fullness- likely Baker's cyst- Follow up with PCP.       Irena ReichmannPriyanka Priyanka, MD 05/20/2016, 14:07    I saw and examined the patient.  I reviewed the resident's note.  I agree with the findings and plan of care as documented in the resident's note.  Any exceptions/additions are edited/noted.  Currently no clear objective evidence to suggest that she has psoriatic arthritis.  Will continue to monitor her for now.  SHe is young and could evolve with time.   Better with conservative therapy and would continue for now.    Consuello BossierHajra Zehra Pacey Willadsen, MD

## 2016-06-04 ENCOUNTER — Encounter (HOSPITAL_BASED_OUTPATIENT_CLINIC_OR_DEPARTMENT_OTHER): Payer: Self-pay | Admitting: DERMATOLOGY

## 2016-06-04 ENCOUNTER — Ambulatory Visit: Payer: BLUE CROSS/BLUE SHIELD | Attending: DERMATOLOGY | Admitting: DERMATOLOGY

## 2016-06-04 VITALS — Temp 97.2°F | Ht 61.42 in | Wt 124.8 lb

## 2016-06-04 DIAGNOSIS — Z6823 Body mass index (BMI) 23.0-23.9, adult: Secondary | ICD-10-CM

## 2016-06-04 DIAGNOSIS — L858 Other specified epidermal thickening: Secondary | ICD-10-CM | POA: Insufficient documentation

## 2016-06-04 DIAGNOSIS — L400 Psoriasis vulgaris: Secondary | ICD-10-CM | POA: Insufficient documentation

## 2016-06-04 HISTORY — DX: Other specified epidermal thickening: L85.8

## 2016-06-04 NOTE — Progress Notes (Signed)
Dermatology Clinic, [facility]  [address]  [place] [address]  [phone]    Date:   06/04/2016  Name: Marquia Costello  Age: 36 y.o.    Chief complaint: Skin Check    HPI  Jestina Stephani is a 36 y.o. female who presents for psoriasis. The patient received her last Stelara injection on 03/28/16. Her itching, rash, and swelling have resolved. Her last TB test was on 03/24/16 and was negative. She has complaints of bumps on her upper arms that appeared within the past few months. She denies any recent changes. She otherwise denies any new, changing, bleeding, or rapidly growing lesions and has no other skin-related complaints. Family history of psoriasis in maternal uncle.    Review of Systems   Constitutional: Negative for fever.   Cardiovascular: Negative for chest pain.   Gastrointestinal: Negative for diarrhea.   Skin: Negative for itching and rash.     Current Medications  . cholecalciferol, vitamin D3, 1,000 unit Oral Tablet Take 2,000 Units by mouth Once a day   . clobetasol (CORMAX) 0.05 % Solution by Apply Topically route Twice daily   . cyclobenzaprine (FLEXERIL) 10 mg Oral Tablet Take 1 Tab (10 mg total) by mouth Three times a day as needed for Muscle spasms   . Ibuprofen (MOTRIN) 600 mg Oral Tablet Take 1 Tab (600 mg total) by mouth Four times a day as needed for Pain   . lidocaine (LIDODERM) 5 % Adhesive Patch, Medicated 1 Patch (700 mg total) by Transdermal route Once per day as needed   . ustekinumab (STELARA) 45 mg/0.5 mL Subcutaneous Syringe 0.5 mL (45 mg total) by Subcutaneous route Per instructions Every 4 months     Allergies   Allergen Reactions   . Mushrooms [Mushroom Flavor] Itching and Swelling     Past Medical History:   Diagnosis Date   . Arthritis 10/30/2015   . Migraine with aura 10/30/2015   . 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  Physical Exam   Constitutional: She is oriented to person, place, and time. She appears well-developed and well-nourished. No distress.   HENT:   Head: Normocephalic and atraumatic.   Neurological: She is alert and oriented to person, place, and time.   Skin: Skin is warm and dry. She is not diaphoretic.        Psychiatric: She has a normal mood and affect.   General skin exam was performed including head, neck, anterior/posterior trunk, bilateral upper, and lower extremities and revealed no areas of concern other than those documented.    Assessment and Plan  Problem List Items Addressed This Visit        Obstetrics/Gynecology    Keratosis pilaris       Dermatology    Plaque psoriasis - Primary        Plan    Will schedule stelara injection if she flares  Can continue clobetasol 0.05% solution to scalp BID PRN    Recommended AmLactin.   Switch to Cetaphil or CeraVe soap and moisturizer.    Follow  The patient was educated on the importance of avoiding excessive sun exposure and wearing sunscreen daily. Advised patient to re-apply sunscreen every 2-3 hours. Advised the patient to avoid going to the tanning beds. Advised to check skin  routinely for any changes, especially any new moles or changes in existing moles.    RTC in 3 months, or sooner if needed    I am scribing for, and in the presence of, Dr. Lorenso CourierPowers for services provided on 06/04/2016.  Morgan StanleyLindsey Mosmiller, SCRIBE   I personally performed the services described in this documentation, as scribed  in my presence, and it is both accurate  and complete.    Miles Costainoxann L Devan Babino, MD

## 2016-06-13 ENCOUNTER — Other Ambulatory Visit (HOSPITAL_BASED_OUTPATIENT_CLINIC_OR_DEPARTMENT_OTHER): Payer: Self-pay | Admitting: DERMATOLOGY

## 2016-07-19 ENCOUNTER — Encounter (HOSPITAL_BASED_OUTPATIENT_CLINIC_OR_DEPARTMENT_OTHER): Payer: Self-pay | Admitting: DERMATOLOGY

## 2016-07-23 ENCOUNTER — Encounter (HOSPITAL_BASED_OUTPATIENT_CLINIC_OR_DEPARTMENT_OTHER): Payer: Self-pay | Admitting: DERMATOLOGY

## 2016-07-24 ENCOUNTER — Ambulatory Visit
Payer: BLUE CROSS/BLUE SHIELD | Attending: DERMATOLOGY | Admitting: Student in an Organized Health Care Education/Training Program

## 2016-07-24 VITALS — Temp 98.1°F | Ht 61.18 in | Wt 125.9 lb

## 2016-07-24 DIAGNOSIS — Z6823 Body mass index (BMI) 23.0-23.9, adult: Secondary | ICD-10-CM

## 2016-07-24 DIAGNOSIS — Z840 Family history of diseases of the skin and subcutaneous tissue: Secondary | ICD-10-CM | POA: Insufficient documentation

## 2016-07-24 DIAGNOSIS — M1990 Unspecified osteoarthritis, unspecified site: Secondary | ICD-10-CM | POA: Insufficient documentation

## 2016-07-24 DIAGNOSIS — Z872 Personal history of diseases of the skin and subcutaneous tissue: Secondary | ICD-10-CM

## 2016-07-24 DIAGNOSIS — Z79899 Other long term (current) drug therapy: Secondary | ICD-10-CM | POA: Insufficient documentation

## 2016-07-24 DIAGNOSIS — L299 Pruritus, unspecified: Secondary | ICD-10-CM

## 2016-07-24 DIAGNOSIS — L409 Psoriasis, unspecified: Secondary | ICD-10-CM | POA: Insufficient documentation

## 2016-07-24 NOTE — Progress Notes (Signed)
[facility], [facility]  [address]  [place] [id number]  [phone]    Date:   07/24/2016  Name: Joyce Hunt  Age: 36 y.o.    Chief complaint: Skin Check    HPI  Joyce Perchesriya Dumlao is a 36 y.o. female who presents for psoriasis. The patient received her last Stelara injection on 03/28/16.  Her last TB test on 03/24/16 was negative.  She reports itching for approximately the past month, since she was due for her Stelara injection. She also thinks she is developing a psoriasis plaque on her right posterior lower leg and reports her fingers and ears are peeling.  She was started on Stelara [place] years ago and reports that she tried all topicals and none of them worked.  She is not using her clobetasol, because she thinks it doesn't work.  Family history of psoriasis in maternal uncle.    Review of Systems   Constitutional: Negative for fever.   Cardiovascular: Negative for chest pain.   Gastrointestinal: Negative for diarrhea.   Skin: Negative for itching and rash.     Current Medications  . cholecalciferol, vitamin D3, 1,000 unit Oral Tablet Take 2,000 Units by mouth Once a day   . clobetasol (CORMAX) 0.05 % Solution by Apply Topically route Twice daily (Patient not taking: Reported on 07/24/2016)   . cyclobenzaprine (FLEXERIL) 10 mg Oral Tablet Take 1 Tab (10 mg total) by mouth Three times a day as needed for Muscle spasms (Patient not taking: Reported on 07/24/2016)   . Ibuprofen (MOTRIN) 600 mg Oral Tablet Take 1 Tab (600 mg total) by mouth Four times a day as needed for Pain   . lidocaine (LIDODERM) 5 % Adhesive Patch, Medicated 1 Patch (700 mg total) by Transdermal route Once per day as needed (Patient not taking: Reported on 07/24/2016)   . ustekinumab (STELARA) 45 mg/0.5 mL Subcutaneous Syringe 0.5 mL (45 mg total) by Subcutaneous route Per instructions Every 4 months     Allergies   Allergen Reactions   . Mushrooms [Mushroom Flavor] Itching and  Swelling     Past Medical History:   Diagnosis Date   . Arthritis 10/30/2015   . Migraine with aura 10/30/2015   . 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  Physical Exam   Constitutional: She is oriented to person, place, and time. She appears well-developed and well-nourished. No distress.   HENT:   Head: Normocephalic and atraumatic.   Neurological: She is alert and oriented to person, place, and time.   Skin: Skin is warm and dry. She is not diaphoretic.        Psychiatric: She has a normal mood and affect.     General skin exam was performed including head, neck, anterior/posterior trunk, bilateral upper, and lower extremities and revealed no areas of concern other than those documented.    Assessment and Plan  Problem List Items Addressed This Visit     None      Visit Diagnoses     Pruritus    -  Primary    H/O psoriasis            1.  H/o psoriasis  -Skin with no evidence of psoriasis today.  -Advised patient to contact office if she has a psoriasis  flare and we will see her ASAP and schedule a Stelara injection; however discussed with patient that it was inappropriate to give Stelara at this time without any evidence of disease  -Can continue clobetasol 0.05% solution to scalp BID PRN    2.  Pruritus  -Recommended fragrance free soaps and detergents and eliminating fabric softeners    RTC as needed    Katha Hamming, MD  [facility]  Department of Dermatology  PGY-2 Resident    I saw and examined the patient.  I reviewed the resident's note.  I agree with the findings and plan of care as documented in the resident's note.  Any exceptions/additions are edited/noted.    Miles Costain, MD

## 2016-07-24 NOTE — Addendum Note (Signed)
Addended by: Miles CostainPOWERS, Mikko Lewellen L on: 07/24/2016 11:01 AM     Modules accepted: Orders

## 2016-08-18 ENCOUNTER — Encounter (HOSPITAL_BASED_OUTPATIENT_CLINIC_OR_DEPARTMENT_OTHER): Payer: Self-pay | Admitting: Rheumatology

## 2016-08-19 ENCOUNTER — Telehealth (HOSPITAL_BASED_OUTPATIENT_CLINIC_OR_DEPARTMENT_OTHER): Payer: Self-pay | Admitting: Family

## 2016-08-19 DIAGNOSIS — M549 Dorsalgia, unspecified: Secondary | ICD-10-CM

## 2016-08-19 NOTE — Telephone Encounter (Signed)
-----   Message from WebstervillePriya Mclennan sent at 08/18/2016  8:20 AM EDT -----  Regarding: Visit Follow-Up Question  Contact: [phone]  Good Morning Dr. Sherryll BurgerShah,    I know we had discussed seeing you in a years time, but last week I was working and we were extremely busy and the pain from back just got really bad and worst. The pain began to shoot up my back to the upper back and through my chest. So I started to walk around and it did help a bit but when I begin to sit or stay in one place for awhile it begins to feel really sore and begins to hurt again. [place] [facility] and it began to do the same thing. When we walked around more it felt a lot better and the pressure that was on my right side and through my leg to my feet eased up a lot. Is there a way I can still continue to go to the pool therapy with [facility] apart form just doing my own stuff at the rec center? The IBprofen still helps a lot too but I'm following your orders in not taking so much and not relying solely on that. The exercising does help and the moving around also, but I would like to also continue the therapy and having someone there  to monitor me. Please let me know what you can do or what I should do also. Thank you so much for your patience and understanding through this. Your compassion is greatly appreciated.    Joyce Hunt  [phone]

## 2016-08-19 NOTE — Telephone Encounter (Signed)
Regarding: Visit Follow-Up Question  ----- Message from Honor Lohonya Sue Miller, LPN sent at 4/74/25958/13/2018  7:34 AM EDT -----       ----- Message from Carylon PerchesPriya Clendenning to Consuello BossierShah, Hajra Zehra, MD sent at 08/18/2016  8:20 AM -----   Good Morning Dr. Sherryll BurgerShah,    I know we had discussed seeing you in a years time, but last week I was working and we were extremely busy and the pain from back just got really bad and worst. The pain began to shoot up my back to the upper back and through my chest. So I started to walk around and it did help a bit but when I begin to sit or stay in one place for awhile it begins to feel really sore and begins to hurt again. [place] [facility] [place] [place] and it began to do the same thing. When we walked around more it felt a lot better and the pressure that was on my right side and through my leg to my feet eased up a lot. Is there a way I can still continue to go to the pool therapy with [facility] apart form just doing my own stuff at the rec center? The IBprofen still helps a lot too but I'm following your orders in not taking so much and not relying solely on that. The exercising does help and the moving around also, but I would like to also continue the therapy and having someone there  to monitor me. Please let me know what you can do or what I should do also. Thank you so much for your patience and understanding through this. Your compassion is greatly appreciated.    [name]-Saida Cumba  [phone]

## 2016-08-22 ENCOUNTER — Telehealth (HOSPITAL_BASED_OUTPATIENT_CLINIC_OR_DEPARTMENT_OTHER): Payer: Self-pay | Admitting: Rheumatology

## 2016-08-22 DIAGNOSIS — M549 Dorsalgia, unspecified: Secondary | ICD-10-CM

## 2016-08-22 NOTE — Telephone Encounter (Signed)
Called and advised pt of the below message. Pt verbalizes understanding and would like to come in. Scheduled pt for 900 tomorrow with Dr. Sherryll BurgerShah and 8/21 @ 820. Pt will call to let us know if she is able to come to the appt tomorrow. Will cancel which ever the pt can not come to.  Cyndia SkeetersKayisha Richad Ramsay, MA  08/22/2016, 12:31

## 2016-08-22 NOTE — Telephone Encounter (Signed)
I would be happy to send another order for pool therapy  I would be happy to re-evaluate you in clinic whenever you want  Just reschedule for the next available  You can always cancel if you feel better    Thank you  Sudie GrumblingHajra

## 2016-08-23 ENCOUNTER — Encounter (HOSPITAL_BASED_OUTPATIENT_CLINIC_OR_DEPARTMENT_OTHER): Payer: Self-pay | Admitting: Rheumatology

## 2016-08-23 ENCOUNTER — Ambulatory Visit: Payer: BLUE CROSS/BLUE SHIELD | Attending: Rheumatology | Admitting: Rheumatology

## 2016-08-23 ENCOUNTER — Ambulatory Visit (HOSPITAL_BASED_OUTPATIENT_CLINIC_OR_DEPARTMENT_OTHER): Payer: BLUE CROSS/BLUE SHIELD

## 2016-08-23 VITALS — BP 114/70 | HR 72 | Temp 97.4°F | Ht 62.0 in | Wt 126.1 lb

## 2016-08-23 DIAGNOSIS — Z6823 Body mass index (BMI) 23.0-23.9, adult: Secondary | ICD-10-CM

## 2016-08-23 DIAGNOSIS — L409 Psoriasis, unspecified: Secondary | ICD-10-CM | POA: Insufficient documentation

## 2016-08-23 DIAGNOSIS — Z79899 Other long term (current) drug therapy: Secondary | ICD-10-CM | POA: Insufficient documentation

## 2016-08-23 DIAGNOSIS — Z791 Long term (current) use of non-steroidal anti-inflammatories (NSAID): Secondary | ICD-10-CM

## 2016-08-23 DIAGNOSIS — E559 Vitamin D deficiency, unspecified: Secondary | ICD-10-CM

## 2016-08-23 DIAGNOSIS — M549 Dorsalgia, unspecified: Secondary | ICD-10-CM

## 2016-08-23 DIAGNOSIS — M2550 Pain in unspecified joint: Secondary | ICD-10-CM | POA: Insufficient documentation

## 2016-08-23 DIAGNOSIS — M7989 Other specified soft tissue disorders: Secondary | ICD-10-CM | POA: Insufficient documentation

## 2016-08-23 LAB — C-REACTIVE PROTEIN(CRP),INFLAMMATION: CRP INFLAMMATION: 3 mg/L (ref <=8.0)

## 2016-08-23 LAB — HEPATIC FUNCTION PANEL
ALBUMIN: 3.8 g/dL (ref 3.5–5.0)
ALKALINE PHOSPHATASE: 97 U/L (ref <150)
ALT (SGPT): 81 U/L — ABNORMAL HIGH (ref <55)
AST (SGOT): 43 U/L — ABNORMAL HIGH (ref 8–41)
BILIRUBIN DIRECT: 0.2 mg/dL (ref <0.3)
BILIRUBIN TOTAL: 0.5 mg/dL (ref 0.3–1.3)
PROTEIN TOTAL: 7.8 g/dL (ref 6.4–8.3)

## 2016-08-23 LAB — SEDIMENTATION RATE: ERYTHROCYTE SEDIMENTATION RATE (ESR): 14 mm/h (ref 0–20)

## 2016-08-23 LAB — CREATININE WITH EGFR
CREATININE: 0.8 mg/dL (ref 0.49–1.10)
ESTIMATED GFR: >59 mL/min/1.73mˆ2 (ref >59)

## 2016-08-23 MED ORDER — IBUPROFEN 600 MG TABLET
600.00 mg | ORAL_TABLET | Freq: Three times a day (TID) | ORAL | 1 refills | Status: DC | PRN
Start: 2016-08-23 — End: 2016-09-18

## 2016-08-23 NOTE — Progress Notes (Signed)
Joyce Hunt,     Your liver enzymes are elevated. We will need to hold your ibuprofen and we will need to recheck this in 1 week.  We can try another anti-inflammatory once liver enzymes normalize.  They can all cause liver enzyme elevation but sometimes one is better then the other.   We can try some prednisone instead of the ibuprofen to see if it will help you as well.     Let me know your thoughts and I can make the change.      No change in plan otherwise.     Thank you for allowing [place] to participate in your care.     Sincerely,   Humberto Leep MD  Rheumatology

## 2016-08-23 NOTE — Progress Notes (Addendum)
Subjective  Joyce Hunt is a 36 y.o. year old female who presents for follow-up of her back pain to clinic. Last seen 05/20/2016.  She reports that she has been feeling very dizzy at work while sitting. Pain will shoot through her back and occasionally in her chest. Getting up and moving will help lessen the pain. Morning stiffness lasting ~5 minutes. Back pain does wake her up throughout the night, around 2 AM. She has been trying not to take Ibuprofen too often but when she does, it provides significant relief. She will notice pain coming back after 5 hours of taking Ibuprofen. She will take 600 mg 2-3 times per day, PRN. Ibuprofen will improve her pain level 75%.  Last Stelara injection was in 03/2016. Since discontinuing Stelara, her back pain has worsened. Left knee has increased swelling since d/c Stelara. She has only tried Stelara for psoriasis. She was on Stelara fro 3-4 years.   She believes the current back pain is different than when she initially became to Rheumatology clinic. Fall of 2015 was her last flare.  She has noticed her eye twitching more frequently recently. Denies inflammation in her eyes such as uveitis or iritis. She continues to take vitamin D.  She has continued stretching and has increased her water intake.     Current Outpatient Prescriptions   Medication Sig   . cholecalciferol, vitamin D3, 1,000 unit Oral Tablet Take 2,000 Units by mouth Once a day   . Ibuprofen (MOTRIN) 600 mg Oral Tablet Take 1 Tab (600 mg total) by mouth Three times a day as needed for Pain   . lidocaine (LIDODERM) 5 % Adhesive Patch, Medicated 1 Patch (700 mg total) by Transdermal route Once per day as needed (Patient not taking: Reported on 07/24/2016)     Objective  Vitals: BP 114/70  Pulse 72  Temp 36.3 C (97.4 F) (Tympanic)   Ht 1.575 m (5\' 2" )  Wt 57.2 kg (126 lb 1.7 oz)  SpO2 99%  BMI 23.06 kg/m2  General:No acute distress; alert and awake  HEENT:Normocephalic and atraumatic; no bleeding or  deformity.  Heart:Regular rate and rhythm without murmurs, rubs, or gallops  Lungs: Clear without coughing, crackling, or wheezing  Abdomen:Soft; bowel sounds present.  Musculoskeletal: No joint swelling.  --Back: Tender along spine and paraspinal. SI joint tenderness (R>L). Schober's = 15.   --Left knee: Tender at insertion of patellar tendon. Hip ROM is painful.   --Right knee: Tender at insertion of patellar tendon.   --Tender along calf. No synovitis over ankle joint  Skin:Warm and dry; no rash, erythema.  Extremities: Right popliteal fossa fullness.   Lymphatic: No lymphadenopathy.    Labs:  Appointment on 03/28/2016   Component Date Value Ref Range Status   . AST (SGOT) 03/28/2016 26  8 - 41 U/L Final   . ALT (SGPT) 03/28/2016 42  <55 U/L Final   Appointment on 03/19/2016   Component Date Value Ref Range Status   . QUANTIFERON, QUALITATIVE 03/19/2016 Negative  Negative Final    No interferon-gamma response to M. tuberculosis antigens was detected.  Infection with M. tuberculosis is unlikely.  A negative result alone does not exclude infection with M. tuberculosis.   Marland Kitchen NIL RESULT 03/19/2016 0.02  IU/mL Final   . TB AG MINUS NIL RESULT 03/19/2016 0.00  IU/mL Final   . MITOGEN MINUS NIL RESULT 03/19/2016 11.70  IU/mL Final   . AST (SGOT) 03/19/2016 79* 8 - 41 U/L Final   . WBC  03/19/2016 7.5  3.5 - 11.0 x10^3/uL Final   . RBC 03/19/2016 4.24  3.63 - 4.92 x10^6/uL Final   . HGB 03/19/2016 13.0  11.2 - 15.2 g/dL Final   . HCT 47/82/9562 37.5  33.5 - 45.2 % Final   . MCV 03/19/2016 88.3  78.0 - 100.0 fL Final   . MCH 03/19/2016 30.6  27.4 - 33.0 pg Final   . MCHC 03/19/2016 34.6  32.5 - 35.8 g/dL Final   . RDW 13/08/6576 12.4  12.0 - 15.0 % Final   . PLATELETS 03/19/2016 319  140 - 450 x10^3/uL Final   . MPV 03/19/2016 8.0  7.5 - 11.5 fL Final   . NEUTROPHIL % 03/19/2016 51  % Final   . LYMPHOCYTE % 03/19/2016 41  % Final   . MONOCYTE % 03/19/2016 7  % Final   . EOSINOPHIL % 03/19/2016 1  % Final   . BASOPHIL %  03/19/2016 0  % Final   . NEUTROPHIL # 03/19/2016 3.78  1.50 - 7.70 x10^3/uL Final   . LYMPHOCYTE # 03/19/2016 3.02  1.00 - 4.80 x10^3/uL Final   . MONOCYTE # 03/19/2016 0.54  0.30 - 1.00 x10^3/uL Final   . EOSINOPHIL # 03/19/2016 0.09  0.00 - 0.50 x10^3/uL Final   . BASOPHIL # 03/19/2016 0.02  0.00 - 0.20 x10^3/uL Final     Assessment/Plan:  1. Long term (current) use of non-steroidal anti-inflammatories (nsaid)    2. Back pain, unspecified back location, unspecified back pain laterality, unspecified chronicity    3. Psoriasis    4. Vitamin D deficiency    5. Arthralgia, unspecified joint      Joyce Hunt is a 36 year old female who presents for follow-up of her back pain to clinic. Last seen 05/20/2016. Her symptoms are concerning for psoriatic arthritis, such as tendinopathy and diffuse tenderness but detailed imaging on stelara was negative. Will hold off on MRI until a future visit. She has discontinued Stelara in the past year and her symptoms seem to be worsening. Consider changing her medications to a biologic due to concern for inflammatory diseases. Discussed the possibility of beginning Humira, Enbrel, or Cosentex today. Patient seemed hesitant to begin this medication today. Would like to continue with PT and stretching. Will need to begin following more closely to monitor effectiveness of treatment courses. I do not believe restarting Stelara would be an effective course of treatment as it did not control her pain completely. On exam, she had tenderness along her spine and paraspinal as well as significant tenderness along her LE and at insertion of patellar tendons bilaterally. Hip range of motion was painful. Previously have also considered FMS.     Today's plan:  1.  Advised that she is okay to continue taking Ibuprofen throughout the day. Will begin taking an extra 600 mg of Ibuprofen per day due to significant pain improvement. Will take scheduled. Discussed risk - kidney, liver, bleeding long term.  Will order hepatic function panel and inflammatory markers today. If inflammation markers are elevated today, will highly consider beginning Humira vs Cosentex. Reading material was provided for both. Refill for Ibuprofen was ordered today.  Also continue taking vitamin D daily. Will check liver and creatinine to monitor. Will   2.  Also consider beginning Sulfasalazine in the future for peripheral joint disease.   3.  Ordered XR Knee today due to persistent pain to evaluate for any fluid on the knee.  Significant relief of symptoms with PT  including the pool therapy, exercises, and stretching. Plan is to continue it for as long as needed. Previous MRI ruled out sacroiliitis but at the time she was on stelara. Currently worse and with inflammatory back features vs before when she was on stelara.      Orders Placed This Encounter   . XR KNEE STANDING BILATERAL   . SEDIMENTATION RATE   . C-REACTIVE PROTEIN(CRP),INFLAMMATION   . Hepatic Function Panel   . CREATININE   . VITAMIN D 25, TOTAL   . Ibuprofen (MOTRIN) 600 mg Oral Tablet     I am scribing for, and in the presence of, Hardie Shackleton, MD, for services provided on 08/23/2016.  McKinsey Amada Jupiter, SCRIBE, 08/23/2016    I personally performed the services described in this documentation, as scribed  in my presence, and it is both accurate  and complete.    Consuello Bossier, MD

## 2016-08-26 LAB — VITAMIN D 25, TOTAL: VITAMIN D, 25OH: 29 ng/mL — ABNORMAL LOW (ref 30–100)

## 2016-08-27 ENCOUNTER — Encounter (HOSPITAL_BASED_OUTPATIENT_CLINIC_OR_DEPARTMENT_OTHER): Payer: Self-pay | Admitting: Rheumatology

## 2016-08-30 ENCOUNTER — Encounter (HOSPITAL_BASED_OUTPATIENT_CLINIC_OR_DEPARTMENT_OTHER): Payer: Self-pay | Admitting: Rheumatology

## 2016-08-30 NOTE — Progress Notes (Signed)
Please do get your liver enzymes repeated when you can and complete X-rays. Can change NSAID to celebrex twice a day instead of ibuprofen.     Keep me updated and let me know if you want to be seen earlier.     No change in plan otherwise.     Thank you for allowing [place] to participate in your care.     Sincerely,   Humberto Leep MD  Rheumatology

## 2016-09-02 ENCOUNTER — Ambulatory Visit (HOSPITAL_BASED_OUTPATIENT_CLINIC_OR_DEPARTMENT_OTHER): Payer: BLUE CROSS/BLUE SHIELD | Admitting: Gynecology

## 2016-09-02 ENCOUNTER — Encounter (HOSPITAL_BASED_OUTPATIENT_CLINIC_OR_DEPARTMENT_OTHER): Payer: Self-pay

## 2016-09-02 ENCOUNTER — Ambulatory Visit: Payer: BLUE CROSS/BLUE SHIELD

## 2016-09-02 ENCOUNTER — Other Ambulatory Visit (HOSPITAL_BASED_OUTPATIENT_CLINIC_OR_DEPARTMENT_OTHER): Payer: Self-pay

## 2016-09-02 VITALS — BP 118/64 | Ht 62.0 in | Wt 127.4 lb

## 2016-09-02 DIAGNOSIS — G43109 Migraine with aura, not intractable, without status migrainosus: Secondary | ICD-10-CM | POA: Insufficient documentation

## 2016-09-02 DIAGNOSIS — Z6823 Body mass index (BMI) 23.0-23.9, adult: Secondary | ICD-10-CM

## 2016-09-02 DIAGNOSIS — Z79899 Other long term (current) drug therapy: Secondary | ICD-10-CM | POA: Insufficient documentation

## 2016-09-02 DIAGNOSIS — Z01419 Encounter for gynecological examination (general) (routine) without abnormal findings: Secondary | ICD-10-CM

## 2016-09-02 DIAGNOSIS — G43909 Migraine, unspecified, not intractable, without status migrainosus: Secondary | ICD-10-CM

## 2016-09-02 DIAGNOSIS — Z1151 Encounter for screening for human papillomavirus (HPV): Secondary | ICD-10-CM | POA: Insufficient documentation

## 2016-09-02 DIAGNOSIS — Z113 Encounter for screening for infections with a predominantly sexual mode of transmission: Secondary | ICD-10-CM

## 2016-09-02 DIAGNOSIS — Z124 Encounter for screening for malignant neoplasm of cervix: Secondary | ICD-10-CM

## 2016-09-02 DIAGNOSIS — Z309 Encounter for contraceptive management, unspecified: Secondary | ICD-10-CM

## 2016-09-02 DIAGNOSIS — N83292 Other ovarian cyst, left side: Secondary | ICD-10-CM

## 2016-09-02 DIAGNOSIS — N83291 Other ovarian cyst, right side: Secondary | ICD-10-CM

## 2016-09-02 DIAGNOSIS — N938 Other specified abnormal uterine and vaginal bleeding: Secondary | ICD-10-CM

## 2016-09-02 DIAGNOSIS — N946 Dysmenorrhea, unspecified: Secondary | ICD-10-CM | POA: Insufficient documentation

## 2016-09-02 DIAGNOSIS — Z0000 Encounter for general adult medical examination without abnormal findings: Secondary | ICD-10-CM | POA: Insufficient documentation

## 2016-09-02 DIAGNOSIS — N9419 Other specified dyspareunia: Secondary | ICD-10-CM

## 2016-09-02 DIAGNOSIS — Z91018 Allergy to other foods: Secondary | ICD-10-CM | POA: Insufficient documentation

## 2016-09-02 DIAGNOSIS — Z01411 Encounter for gynecological examination (general) (routine) with abnormal findings: Secondary | ICD-10-CM

## 2016-09-02 DIAGNOSIS — L400 Psoriasis vulgaris: Secondary | ICD-10-CM

## 2016-09-02 DIAGNOSIS — M1990 Unspecified osteoarthritis, unspecified site: Secondary | ICD-10-CM

## 2016-09-02 DIAGNOSIS — R748 Abnormal levels of other serum enzymes: Secondary | ICD-10-CM | POA: Insufficient documentation

## 2016-09-02 HISTORY — DX: Dysmenorrhea, unspecified: N94.6

## 2016-09-02 LAB — HIV1/HIV2 SCREEN, COMBINED ANTIGEN AND ANTIBODY: HIV SCREEN, COMBINED ANTIGEN & ANTIBODY: NEGATIVE

## 2016-09-02 MED ORDER — NAPROXEN SODIUM 550 MG TABLET
550.00 mg | ORAL_TABLET | Freq: Two times a day (BID) | ORAL | 1 refills | Status: DC
Start: 2016-09-02 — End: 2017-03-27

## 2016-09-02 NOTE — Progress Notes (Signed)
NOTE AUTHORED BY:  PAM COURTNEY, APRN,WHNP-BC   NEW PATIENT   PCP:  Dr. Edward Qualia     36 yo G1 P[address] [place] presents for Annual GYN exam and "severe cramping with menses ?"   Hx of small ovarian cysts noted 10/2015 [facility] ED.     Partner status:  Married     Last Pap:   ? 5-6 years ago ? Results   Abnormal Pap:  Yes  2012 ? Abn Pap with Colposcopy in [place], [place]  ? Results   Last STI Screen: none  STI hx:  No HSV or EGW.     Contraception: condoms 50%   Menses:  Monthly/4-5 days/heavy MC Day 1-2 then moderate flow   IMB:  none  PCB: none  Cramping: Moderate to Severe  Takes 600 mg relieves    Pelvic [facility]:  10/22/2015 small bilateral cysts per ED Scan   Smoking:  former    HML:  No results found for: CHOLESTEROL, HDLCHOL, LDLCHOL, LDLCHOLDIR, TRIG   Lab Results   Component Value Date    GLUCOSEFAST 83 12/29/2015      No results found for: HA1C  Lab Results   Component Value Date    TSH 3.725 12/19/2014          Exercise:  Limited by fall 10/2015   Calcium:  X 2 servings/day and Vitamin D3 5000 IU/day since of a Stelara RX   Caffeine:  X 2 servings/day   Vaccines:  UTD; Flu Vaccine 2017 - Yes; Gardasil Vaccines - none      Immunization History   Administered Date(s) Administered   . Influenza Vaccine IM (ADMIN) 10/12/2014, 10/30/2015       GYN Concerns:  "severe cramping with menses ?"   Hx of small ovarian cysts noted 10/2015 [facility] ED.     Occupation:  Banker     Past Medical History:   Diagnosis Date   . Abdominal pain 10/22/2015    ED Visit > [facility] showed ovaries with small cysts bilaterally only   . Abnormal Pap smear of cervix 2012 ?    [place]   ? reading   . Arthritis     Ruled out RA   Rheumatologist Dr. Manuella Ghazi 05/2016    . Dysfunctional uterine bleeding 10/30/2015   . Elevated liver enzymes 03/2016    Sees Dr. Manuella Ghazi Rheumatology    . Fall 11/2015    steps in home fall in [place] > X-Ray without fracture > MRI 12/2015 - Negative for fracture; saw slight abnormality iin left femoral  neck/socket area    . Keratosis pilaris 06/04/2016   . Migraine with aura     Ocular Migraine reported 07/2016 > Motrin 400 mg po relief.    . Plaque psoriasis 2015    Stelara RX > stopped 03/2016  OTC Vitamin D3  Sees Dr. Edward Qualia PCP for care   . Severe dysmenorrhea 09/02/2016     Past Surgical History:   Procedure Laterality Date   . HX COLPOSCOPY  2012 ?     [place]  ? results    . HX ELECTIVE ABORTION  2009    Vaccum Aspiration without complications      Family Medical History     Problem Relation (Age of Onset)    Arthritis-rheumatoid Paternal Grandmother    Cirrhosis Father    Diabetes Mother, Maternal Aunt    Heart Disease Father (38)    Hypertension Mother  Liver Disease Father    Ovarian Cancer Maternal Grandmother (35)    Psoriasis Maternal Uncle    Stroke Maternal Grandfather        Current Outpatient Prescriptions   Medication Sig   . cholecalciferol, vitamin D3, 1,000 unit Oral Tablet Take 2,000 Units by mouth Once a day   . Ibuprofen (MOTRIN) 600 mg Oral Tablet Take 1 Tab (600 mg total) by mouth Three times a day as needed for Pain   . naproxen sodium (ANAPROX) 550 mg Oral Tablet Take 1 Tab (550 mg total) by mouth Twice daily with food 24 hours before/during/after menses pain     Allergies   Allergen Reactions   . Mushrooms [Mushroom Flavor] Itching and Swelling     Immunization History   Administered Date(s) Administered   . Influenza Vaccine IM (ADMIN) 10/12/2014, 10/30/2015         Review of Systems   All systems are negative except:  + GU     General/constitutional: good general state of health/well-being.  Cardiovascular: Negative.   Respiratory: Negative   Gastrointestinal: Negative  Genitourinary:  "severe cramping with menses ?"   Hx of small ovarian cysts noted 10/2015 [facility] ED.   Negative for dysuria, urgency, frequency, hematuria and flank pain.   Musculoskeletal: Negative.   Endo/Heme/Allergies: Negative.   Neurological: Negative.    Psychiatric: Negative.  Skin: negative     BP  118/64  Ht 1.575 m (5' 2" )  Wt 57.8 kg (127 lb 6.8 oz)  LMP 08/20/2016  BMI 23.31 kg/m2  Body mass index is 23.31 kg/(m^2).    General: Pleasant female in no acute distress.  Skin:  Intact, without lesions.  HEENT: NCAT, EOMT, without lesions.  Neck/Thyroid: no masses or thyromegaly.  Lungs: Clear to auscultation bilaterally.  Cardiac: Regular rate and rhythm, without murmur, rub, or gallop.  Abdomen: Soft, NT, ND, no masses.  Extremities: No cyanosis, clubbing, or edema.  Neurologic: Alert and Oriented, no focal deficit  Psychiatric: Mood and affect normal.    Other appropriate exam: Female  Breasts:   R > L sized. B/L fibrocystic changes noted; no LN enlargement B/L     Pelvic:    External genitalia - no lesions.  Urethral - WNL  Bladder - WNL  Vagina - no discharge  Cervix - WNL  Uterus - AV, NT, NS   Adnexa - B/L NT  Rectum: WNL or not directly evaluated.  Anus - no external hemorrhoids.          ICD-10-CM    1. Cervical cancer screening Z12.4 CYTOPATHOLOGY-GYN (PAP AND HPV TESTS)     CYTOPATHOLOGY-GYN (PAP AND HPV TESTS)   2. Screening for STDs (sexually transmitted diseases) Z11.3 CHLAMYDIA TRACHOMITIS DNA BY PCR (INHOUSE)     NEISSERIA GONORRHOEAE DNA BY PCR     HIV1/HIV2 SCREEN, COMBINED ANTIGEN AND ANTIBODY     CHLAMYDIA TRACHOMITIS DNA BY PCR (INHOUSE)     NEISSERIA GONORRHOEAE DNA BY PCR   3. Well woman exam with routine gynecological exam Z01.419    4. Elevated liver enzymes history  R74.8    5. Severe dysmenorrhea N94.6 naproxen sodium (ANAPROX) 550 mg Oral Tablet           Plan:  Release of Information signed for Pap/Colposcopy in [place], [place] > name uncertain > left Release Form with Triage Nurse and patient will call name/phone # of provider so she can fax to office.   Pap + HR testing sent   STI Screen:  GC/CT swab sent; HIV lab 2018 ordered  BSA, Shared-decision CBE 2018   Contraception:  Advised 100% condoms; safe sex discussion with condom kit given.  Discussed POP's or Depo injections as  contraception options since Migraines with Aura hx.  Adjunct of Anaprox DS RX to take for dysmenorrhea - take 24 hrs before/during/after severe cramping days.   Recommended from new studies, natural Ca+ foods (listing given) x3/day instead of Ca+ supplements (higher heart disease risk) and continue Vit D 5000 IU/day   Recommend getting Flu Vaccine 10/2016 or after.  RTN 1 year.      11/20 min was spent on counseling/discussion of see below - separate from the annual preventative exam.  Dysmenorrhea with contraception counseling options/treatment with Anaprox DS RX ordered     Sebastian Ache, APRN,WHNP-BC 09/02/2016, 12:39  Patient seen independently with co-signing physician present in clinic.

## 2016-09-03 LAB — NEISSERIA GONORRHOEAE DNA BY PCR: NEISSERIA GONORRHOEAE PCR: NOT DETECTED

## 2016-09-03 LAB — CHLAMYDIA TRACHOMITIS DNA BY PCR (INHOUSE): CHLAMYDIA TRACHOMATIS PCR: NOT DETECTED

## 2016-09-05 ENCOUNTER — Encounter (HOSPITAL_BASED_OUTPATIENT_CLINIC_OR_DEPARTMENT_OTHER): Payer: Self-pay | Admitting: DERMATOLOGY

## 2016-09-05 NOTE — Progress Notes (Signed)
As discussed please repeat her liver function test when you can.  Orders in the system.  We can then decide on another anti-inflammatory and then also consider changing her medication to Humira or Enbrel.  Your vitamin-D is also on the lower side.  I would continue to recommend taking some vitamin D over the counter.  Two thousand five 1000 units internationally daily is usually sufficient.    No change in plan otherwise.     Thank you for allowing us to participate in your care.     Sincerely,   Humberto LeepHajra Z Mekhai Venuto MD  Rheumatology

## 2016-09-10 ENCOUNTER — Telehealth (HOSPITAL_BASED_OUTPATIENT_CLINIC_OR_DEPARTMENT_OTHER): Payer: Self-pay | Admitting: Rheumatology

## 2016-09-10 DIAGNOSIS — Z791 Long term (current) use of non-steroidal anti-inflammatories (NSAID): Secondary | ICD-10-CM

## 2016-09-10 DIAGNOSIS — L409 Psoriasis, unspecified: Secondary | ICD-10-CM

## 2016-09-10 DIAGNOSIS — E559 Vitamin D deficiency, unspecified: Secondary | ICD-10-CM

## 2016-09-10 DIAGNOSIS — M549 Dorsalgia, unspecified: Secondary | ICD-10-CM

## 2016-09-10 DIAGNOSIS — M2550 Pain in unspecified joint: Secondary | ICD-10-CM

## 2016-09-10 LAB — HISTORICAL CYTOPATHOLOGY-GYN (PAP AND HPV TESTS)

## 2016-09-10 NOTE — Telephone Encounter (Signed)
Pt called in stating that she would like the knee xray done at urgent care. Advised pt new order was placed and signed for urgent care.  Cyndia SkeetersKayisha Jerriah Ines, MA  09/10/2016, 13:35

## 2016-09-11 NOTE — Addendum Note (Signed)
Addended by: Morgan Rennert INEZ on: 09/11/2016 09:15 AM     Modules accepted: Orders

## 2016-09-13 ENCOUNTER — Ambulatory Visit (INDEPENDENT_AMBULATORY_CARE_PROVIDER_SITE_OTHER): Payer: BLUE CROSS/BLUE SHIELD

## 2016-09-13 DIAGNOSIS — M2550 Pain in unspecified joint: Secondary | ICD-10-CM

## 2016-09-13 DIAGNOSIS — M549 Dorsalgia, unspecified: Secondary | ICD-10-CM

## 2016-09-13 DIAGNOSIS — E559 Vitamin D deficiency, unspecified: Secondary | ICD-10-CM

## 2016-09-13 DIAGNOSIS — M25561 Pain in right knee: Secondary | ICD-10-CM

## 2016-09-13 DIAGNOSIS — Z791 Long term (current) use of non-steroidal anti-inflammatories (NSAID): Secondary | ICD-10-CM

## 2016-09-13 DIAGNOSIS — L409 Psoriasis, unspecified: Secondary | ICD-10-CM

## 2016-09-13 DIAGNOSIS — M25562 Pain in left knee: Secondary | ICD-10-CM

## 2016-09-15 NOTE — Progress Notes (Signed)
Looks good.   I'm still waiting to hear from you regarding your Ibuprofen and if you need an alternative anti-inflammatory?    No change in plan otherwise.     Thank you for allowing us to participate in your care.     Sincerely,   Humberto LeepHajra Z Shawnique Mariotti MD  Rheumatology

## 2016-09-16 ENCOUNTER — Telehealth (HOSPITAL_BASED_OUTPATIENT_CLINIC_OR_DEPARTMENT_OTHER): Payer: Self-pay | Admitting: Rheumatology

## 2016-09-16 NOTE — Telephone Encounter (Signed)
I can see you Thursday at 130 pm? Let me know if you will be able to come and I will put you on the schedule.  Please get repeat blood work Wednesday before coming in so we can discuss medication options  I am not sure what you mean by injury?  But we can discuss it on follow up    Sincerely,   Joyce LeepHajra Z Charo Philipp MD  Rheumatology    Needs liver function panel repeated

## 2016-09-16 NOTE — Telephone Encounter (Signed)
-----   Message from Jump RiverPriya Agrawal sent at 09/14/2016 10:14 AM EDT -----  Regarding: RE:(No subject)  Contact: [phone]  Good Afternoon Dr, Sherryll BurgerShah:    I just wanted to let you know that I took [facility] and I have stopped taking the Ibuprofen due to your orders, but my back has been hurting a bit more recently and especially at work when I have to sit for 12 hours to where I may have pinched a nerve up my right side to my neck and had to take one for the past three days. When I did my xrays and I laid on the table my lower back and mid back was hurting and really sore and paining. She didn't scan the spine and lower back either. 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 Please let me know when is a good time to go over this and if you could or can refer me to do something else. Because I really cannot sit here for 12 hours and not be moving around. I multi task and help out  when I can when we are short staffed also and also that helps ease the pain and soreness from sitting here. Anywho  kindly let me know when you can. Thank you so much.     -Carylon Perchesriya Sobczyk  [phone]  ----- Message -----  From: Consuello BossierHajra Zehra Shah, MD  Sent: 08/30/2016  6:28 PM EDT  To: Carylon PerchesPriya Dyches  Subject: (No subject)    Please do get your liver enzymes repeated when you can and complete X-rays. Can change NSAID to celebrex twice a day instead of ibuprofen.     Keep me updated and let me know if you want to be seen earlier.     No change in plan otherwise.     Thank you for allowing us to participate in your care.     Sincerely,   Humberto LeepHajra Z Shah MD  Rheumatology

## 2016-09-17 NOTE — Telephone Encounter (Signed)
----- Message from Joyce PerchesPriya Gewirtz sent at 09/17/2016 10:47 AM EDT -----  Regarding: RE:(No subject)  Contact: [phone]  Good Morning Dr. Sherryll BurgerShah:    I can meet with you on Thursday at 1:30pm. And can go get blood work done on Wednesday also. Can I get that done at your building?     Also, the injury I was referring to was my fall I had which has led to more of the back pain I have been feeling. The fall that happened in November of last year. My OB/GYN doctor Joyce Hunt has prescribed Naproxen to take. That was due to my bad period cramps I have been experiencing and so that was recommended to be taken 24 hours before my period, once during and 24 hours after. She said it's another form of an inflammatory. So please let me know if you would like to recommend this or something else in the meantime. I have stopped taking the Ibuprofen regularly and only took one two days ago after coming home form work.       Please let me know if there is anything else I need to have ready for you other than the blood work Wednesday. Thank you so much.    -Joyce Perchesriya Hunt  [phone]  ----- Message -----  From: Consuello BossierHajra Zehra Shah, MD  Sent: 09/16/2016  9:10 AM EDT  To: Joyce PerchesPriya Hunt  Subject: RE:(No subject)    I can see you Thursday at 130 pm? Let me know if you will be able to come and I will put you on the schedule.  Please get repeat blood work Wednesday before coming in so we can discuss medication options  I am not sure what you mean by injury?  But we can discuss it on follow up    Sincerely,   Humberto LeepHajra Z Shah MD  Rheumatology        ----- Message -----     From: Joyce PerchesPriya Hunt     Sent: 09/14/2016 10:14 AM EDT       To: Consuello BossierHajra Zehra Shah, MD  Subject: RE:(No subject)    Good Afternoon Dr, Sherryll BurgerShah:    I just wanted to let you know that I took [facility] and I have stopped taking the Ibuprofen due to your orders, but my back has been hurting a bit more recently and especially at work when I have to sit  for 12 hours to where I may have pinched a nerve up my right side to my neck and had to take one for the past three days. When I did my xrays and I laid on the table my lower back and mid back was hurting and really sore and paining. She didn't scan the spine and lower back either. 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 Please let me know when is a good time to go over this and if you could or can refer me to do something else. Because I really cannot sit here for 12 hours and not be moving around. I multi task and help out  when I can when we are short staffed also and also that helps ease the pain and soreness from sitting here. Anywho  kindly let me know when you can. Thank you so much.     -Joyce Hunt  [phone]  ----- Message -----  From: Consuello Bossier, MD  Sent: 08/30/2016  6:28 PM EDT  To: Joyce Hunt  Subject: (No subject)    Please do get your liver enzymes repeated when you can and complete X-rays. Can change NSAID to celebrex twice a day instead of ibuprofen.     Keep me updated and let me know if you want to be seen earlier.     No change in plan otherwise.     Thank you for allowing [place] to participate in your care.     Sincerely,   Humberto Leep MD  Rheumatology

## 2016-09-18 ENCOUNTER — Ambulatory Visit (HOSPITAL_BASED_OUTPATIENT_CLINIC_OR_DEPARTMENT_OTHER): Payer: BLUE CROSS/BLUE SHIELD

## 2016-09-18 ENCOUNTER — Encounter (HOSPITAL_BASED_OUTPATIENT_CLINIC_OR_DEPARTMENT_OTHER): Payer: Self-pay | Admitting: Rheumatology

## 2016-09-18 ENCOUNTER — Other Ambulatory Visit (INDEPENDENT_AMBULATORY_CARE_PROVIDER_SITE_OTHER): Payer: BLUE CROSS/BLUE SHIELD

## 2016-09-18 ENCOUNTER — Ambulatory Visit (INDEPENDENT_AMBULATORY_CARE_PROVIDER_SITE_OTHER): Payer: BLUE CROSS/BLUE SHIELD

## 2016-09-18 ENCOUNTER — Ambulatory Visit: Payer: BLUE CROSS/BLUE SHIELD | Attending: Rheumatology | Admitting: Rheumatology

## 2016-09-18 VITALS — BP 120/70 | HR 101 | Temp 97.5°F | Ht 62.0 in | Wt 129.4 lb

## 2016-09-18 DIAGNOSIS — M549 Dorsalgia, unspecified: Secondary | ICD-10-CM

## 2016-09-18 DIAGNOSIS — Z79899 Other long term (current) drug therapy: Secondary | ICD-10-CM | POA: Insufficient documentation

## 2016-09-18 DIAGNOSIS — E559 Vitamin D deficiency, unspecified: Secondary | ICD-10-CM

## 2016-09-18 DIAGNOSIS — Z791 Long term (current) use of non-steroidal anti-inflammatories (NSAID): Secondary | ICD-10-CM

## 2016-09-18 DIAGNOSIS — M545 Low back pain: Secondary | ICD-10-CM | POA: Insufficient documentation

## 2016-09-18 DIAGNOSIS — M238X1 Other internal derangements of right knee: Secondary | ICD-10-CM | POA: Insufficient documentation

## 2016-09-18 DIAGNOSIS — R7989 Other specified abnormal findings of blood chemistry: Secondary | ICD-10-CM

## 2016-09-18 DIAGNOSIS — R748 Abnormal levels of other serum enzymes: Secondary | ICD-10-CM | POA: Insufficient documentation

## 2016-09-18 DIAGNOSIS — R945 Abnormal results of liver function studies: Secondary | ICD-10-CM

## 2016-09-18 DIAGNOSIS — M238X2 Other internal derangements of left knee: Secondary | ICD-10-CM | POA: Insufficient documentation

## 2016-09-18 DIAGNOSIS — M2550 Pain in unspecified joint: Secondary | ICD-10-CM

## 2016-09-18 DIAGNOSIS — L409 Psoriasis, unspecified: Secondary | ICD-10-CM

## 2016-09-18 DIAGNOSIS — M25571 Pain in right ankle and joints of right foot: Secondary | ICD-10-CM | POA: Insufficient documentation

## 2016-09-18 DIAGNOSIS — M25561 Pain in right knee: Secondary | ICD-10-CM | POA: Insufficient documentation

## 2016-09-18 DIAGNOSIS — Z9181 History of falling: Secondary | ICD-10-CM | POA: Insufficient documentation

## 2016-09-18 DIAGNOSIS — Z6823 Body mass index (BMI) 23.0-23.9, adult: Secondary | ICD-10-CM

## 2016-09-18 LAB — HEPATIC FUNCTION PANEL
ALBUMIN: 4.1 g/dL (ref 3.5–5.0)
ALKALINE PHOSPHATASE: 94 U/L (ref <150)
ALT (SGPT): 47 U/L (ref <55)
AST (SGOT): 30 U/L (ref 8–41)
BILIRUBIN DIRECT: 0.1 mg/dL (ref <0.3)
BILIRUBIN TOTAL: 0.3 mg/dL (ref 0.3–1.3)
PROTEIN TOTAL: 8.1 g/dL (ref 6.4–8.3)

## 2016-09-18 LAB — HUMAN PAPILLOMA VIRUS (HPV) BY PCR WITH HIGH RISK GENOTYPING (THINPREP)
HPV OTHER: POSITIVE — AB
HPV16 PCR: NEGATIVE
HPV18 PCR: NEGATIVE

## 2016-09-18 MED ORDER — DICLOFENAC SODIUM 50 MG TABLET,DELAYED RELEASE
50.00 mg | DELAYED_RELEASE_TABLET | Freq: Two times a day (BID) | ORAL | 2 refills | Status: DC | PRN
Start: 2016-09-18 — End: 2016-12-26

## 2016-09-18 MED ORDER — CYCLOBENZAPRINE 5 MG TABLET
5.00 mg | ORAL_TABLET | Freq: Every evening | ORAL | 1 refills | Status: DC
Start: 2016-09-18 — End: 2017-03-27

## 2016-09-18 NOTE — Progress Notes (Signed)
Liver enzymes have thankfully normalized.      I am putting in a prescription for diclofenac.  50 milligrams twice a day as needed.  Very important that you take it with food.  Can be taken in addition to Flexeril.      We will need to repeat your liver enzymes in a couple of months.    No change in plan otherwise.     Thank you for allowing us to participate in your care.     Sincerely,   Humberto LeepHajra Z Qusay Villada MD  Rheumatology

## 2016-09-18 NOTE — Progress Notes (Signed)
Subjective  Joyce Hunt is a 36 y.o. year old female who presents for Joint Pain   to clinic.   Patient has history of psoriasis for which she was on Stelara for approximately 3-4 years. Her last injection was 03/2016, after which time she had changed dermatologists to Dr. Lorenso Courier. Dr. Lorenso Courier recommended stopping Stelara and seeing how patient did off this medication.  Patient states she has flaking of skin in the periauricular areas bilaterally and on the posterior aspect of her right leg, however she denies significant plaques and states she is controlling her psoriasis with diet, exercise, moisturizing lotion, and stress management.    Patient does admit to low back pain in the lumbosacral region which started after a mechanical fall on November 2017 where she was walking up steps, had missed a step, and fell backward and landed on her sacral region on brick floor.  She states that she went to an urgent care and had x-ray films performed there which were negative for fracture.  She has since been having pain in the center of her spine in the lumbosacral region that radiates up the spine to the upper lumbar area, is worse when she changes positions and gets up from sitting position, and is worse toward the end of the day.  The pain does wake her up in the middle of the night approximately 3 times a week and she does state that the pain is better with movement.  Patient has been controlling her pain with ibuprofen and naproxen, and she states her pain is 5/10 today. She was recently told to hold ibuprofen and naproxen secondary to elevated liver enzymes on 8/17 (AST 43, ALT 81), however she states the pain is too high without the NSAIDs and she restarted taking them.    Patient also admits to right knee pain which she has had for the last 7 years and right ankle pain which she has had since a separate mechanical fall last year.  Both her knee and ankle pain have morning stiffness that lasts approximately 10 min, and  her pain and stiffness get better with activity.  She states that she has intermittent swelling in the right knee and ankle and that the pain is worst toward the end of the day.  She has 0/10 pain during our interview today.    Patient was noted to have total vitamin D 25 level of 29 on 08/23/2016, after which time she has started vitamin-D supplementation and states she is compliant with.    Current Outpatient Prescriptions   Medication Sig   . cholecalciferol, vitamin D3, 1,000 unit Oral Tablet Take 2,000 Units by mouth Once a day   . cyclobenzaprine (FLEXERIL) 5 mg Oral Tablet Take 1 Tab (5 mg total) by mouth Every night   . Ibuprofen (MOTRIN) 600 mg Oral Tablet Take 1 Tab (600 mg total) by mouth Three times a day as needed for Pain   . naproxen sodium (ANAPROX) 550 mg Oral Tablet Take 1 Tab (550 mg total) by mouth Twice daily with food 24 hours before/during/after menses pain     Family history, medical history and surgical history reviewed in the system and updated to reflect any changes since last visit    Objective  Vitals: BP 120/70  Pulse (!) 101  Temp 36.4 C (97.5 F) (Thermal Scan)   Ht 1.575 m ( )  Wt 58.7 kg (129 lb 6.6 oz)  LMP 08/20/2016  SpO2 100%  BMI 23.67 kg/m2  General:  appears well - no acute distress - no oral ulcers  Cardiovascular: no obvious murmurs, cardiac are sounds are not distant   Gastrointestinal: no distension or tenderness with palpation  Lungs: no wheezes, no crackles - breath sounds not decreased   Joints: tenderness with palpation right 1st MTP and left 3rd, 4th, and 5th MTP. Bilateral crepitus with knee flexion and extension  Extremities: no peripheral edema  Skin: no rashes    Labs/X-rays/Work up  Appointment on 09/18/2016   Component Date Value Ref Range Status   . ALBUMIN 09/18/2016 4.1  3.5 - 5.0 g/dL Final   . ALKALINE PHOSPHATASE 09/18/2016 94  <150 U/L Final   . ALT (SGPT) 09/18/2016 47  <55 U/L Final   . AST (SGOT) 09/18/2016 30  8 - 41 U/L Final   .  BILIRUBIN TOTAL 09/18/2016 0.3  0.3 - 1.3 mg/dL Final   . BILIRUBIN DIRECT 09/18/2016 0.1  <0.3 mg/dL Final   . PROTEIN TOTAL 09/18/2016 8.1  6.4 - 8.3 g/dL Final   Appointment on 09/02/2016   Component Date Value Ref Range Status   . HIV SCREEN, COMBINED ANTIGEN & ANT* 09/02/2016 Negative  Negative Final    Call the laboratory for assistance in cases with discordance between screen & confirmation    Note: for patients <2 yrs of age, it is recommended that an "HIV-1 Proviral DNA by PCR" be ordered to rule out the presence of HIV1/2 antigen or antibody from maternal origin.   Office Visit on 09/02/2016   Component Date Value Ref Range Status   . CHLAMYDIA TRACHOMATIS PCR 09/02/2016 Not Detected  Not Detected Final   . NEISSERIA GONORRHEA PCR 09/02/2016 Not Detected  Not Detected Final   . HPV16 PCR 09/02/2016 Negative  Negative Final   . HPV18 PCR 09/02/2016 Negative  Negative Final   . HPV OTHER 09/02/2016 Positive* Negative Final   Appointment on 08/23/2016   Component Date Value Ref Range Status   . ERYTHROCYTE SEDIMENTATION RATE (ES* 08/23/2016 14  0 - 20 mm/hr Final   . CRP INFLAMMATION 08/23/2016 3.0  <=8.0 mg/L Final   . ALBUMIN 08/23/2016 3.8  3.5 - 5.0 g/dL Final   . ALKALINE PHOSPHATASE 08/23/2016 97  <150 U/L Final   . ALT (SGPT) 08/23/2016 81* <55 U/L Final   . AST (SGOT) 08/23/2016 43* 8 - 41 U/L Final   . BILIRUBIN TOTAL 08/23/2016 0.5  0.3 - 1.3 mg/dL Final   . BILIRUBIN DIRECT 08/23/2016 0.2  <0.3 mg/dL Final   . PROTEIN TOTAL 08/23/2016 7.8  6.4 - 8.3 g/dL Final   . CREATININE 16/10/960408/17/2018 0.80  0.49 - 1.10 mg/dL Final   . ESTIMATED GFR 08/23/2016 >59  >59 mL/min/1.5835m^2 Final   . VITAMIN D, 25OH 08/23/2016 29* 30 - 100 ng/mL Final      Per Endocrine Society:  Deficient if <= 20 ng/mL  Insufficient if 21-29 ng/mL  Sufficient if >=30 ng/mL    Total 25-OH Vitamin D testing was performed on a BioRad BioPlex analyzer using FDA-approved protocols and multiplexed immunossay.  Vitamin D toxicity is rare and  supplementation is generally considered safe; therefore, routine measurement of 25-OH vitamin D is generally not indicated and is not supported as best practice.   Appointment on 03/28/2016   Component Date Value Ref Range Status   . AST (SGOT) 03/28/2016 26  8 - 41 U/L Final   . ALT (SGPT) 03/28/2016 42  <55 U/L Final   Appointment on 03/19/2016   Component  Date Value Ref Range Status   . QUANTIFERON, QUALITATIVE 03/19/2016 Negative  Negative Final    No interferon-gamma response to M. tuberculosis antigens was detected.  Infection with M. tuberculosis is unlikely.  A negative result alone does not exclude infection with M. tuberculosis.   Marland Kitchen NIL RESULT 03/19/2016 0.02  IU/mL Final   . TB AG MINUS NIL RESULT 03/19/2016 0.00  IU/mL Final   . MITOGEN MINUS NIL RESULT 03/19/2016 11.70  IU/mL Final   . AST (SGOT) 03/19/2016 79* 8 - 41 U/L Final   . WBC 03/19/2016 7.5  3.5 - 11.0 x10^3/uL Final   . RBC 03/19/2016 4.24  3.63 - 4.92 x10^6/uL Final   . HGB 03/19/2016 13.0  11.2 - 15.2 g/dL Final   . HCT 16/10/9602 37.5  33.5 - 45.2 % Final   . MCV 03/19/2016 88.3  78.0 - 100.0 fL Final   . MCH 03/19/2016 30.6  27.4 - 33.0 pg Final   . MCHC 03/19/2016 34.6  32.5 - 35.8 g/dL Final   . RDW 54/09/8117 12.4  12.0 - 15.0 % Final   . PLATELETS 03/19/2016 319  140 - 450 x10^3/uL Final   . MPV 03/19/2016 8.0  7.5 - 11.5 fL Final   . NEUTROPHIL % 03/19/2016 51  % Final   . LYMPHOCYTE % 03/19/2016 41  % Final   . MONOCYTE % 03/19/2016 7  % Final   . EOSINOPHIL % 03/19/2016 1  % Final   . BASOPHIL % 03/19/2016 0  % Final   . NEUTROPHIL # 03/19/2016 3.78  1.50 - 7.70 x10^3/uL Final   . LYMPHOCYTE # 03/19/2016 3.02  1.00 - 4.80 x10^3/uL Final   . MONOCYTE # 03/19/2016 0.54  0.30 - 1.00 x10^3/uL Final   . EOSINOPHIL # 03/19/2016 0.09  0.00 - 0.50 x10^3/uL Final   . BASOPHIL # 03/19/2016 0.02  0.00 - 0.20 x10^3/uL Final   Appointment on 12/29/2015   Component Date Value Ref Range Status   . GLUCOSE FASTING 12/29/2015 83  70 - 105 mg/dL  Final   . ERYTHROCYTE SEDIMENTATION RATE (ES* 12/29/2015 17  0 - 20 mm/hr Final   . CRP INFLAMMATION 12/29/2015 <1.0  <=8.0 mg/L Final   . CREATINE KINASE 12/29/2015 63  25 - 190 U/L Final   . VITAMIN D, 25OH 12/29/2015 23* 30 - 100 ng/mL Final      Per Endocrine Society:  Deficient if <= 20 ng/mL  Insufficient if 21-29 ng/mL  Sufficient if >=30 ng/mL    Total 25-OH Vitamin D testing was performed on a BioRad BioPlex analyzer using FDA-approved protocols and multiplexed immunossay.  Vitamin D toxicity is rare and supplementation is generally considered safe; therefore, routine measurement of 25-OH vitamin D is generally not indicated and is not supported as best practice.   Admission on 10/22/2015, Discharged on 10/23/2015   Component Date Value Ref Range Status   . SODIUM 10/22/2015 136  136 - 145 mmol/L Final   . POTASSIUM 10/22/2015 3.7  3.5 - 5.1 mmol/L Final   . CHLORIDE 10/22/2015 104  96 - 111 mmol/L Final   . CO2 TOTAL 10/22/2015 20* 22 - 32 mmol/L Final   . ANION GAP 10/22/2015 12  4 - 13 mmol/L Final   . CALCIUM 10/22/2015 9.9  8.5 - 10.2 mg/dL Final   . GLUCOSE 14/78/2956 98  65 - 139 mg/dL Final   . BUN 21/30/8657 13  8 - 25 mg/dL Final   . CREATININE 84/69/6295  0.83  0.49 - 1.10 mg/dL Final   . BUN/CREA RATIO 10/22/2015 16  6 - 22 Final   . ESTIMATED GFR 10/22/2015 >59  >59 mL/min/1.51m^2 Final   . LIPASE 10/22/2015 58  10 - 80 U/L Final   . ALKALINE PHOSPHATASE 10/22/2015 116  <150 U/L Final   . ALT (SGPT) 10/22/2015 54  <55 U/L Final   . AST (SGOT) 10/22/2015 36  8 - 41 U/L Final   . BILIRUBIN DIRECT 10/22/2015 0.1  <0.3 mg/dL Final   . GGT 16/10/9602 138* 7 - 50 U/L Final   . HIV SCREEN, COMBINED ANTIGEN & ANT* 10/22/2015 Negative  Negative, Indeterminate Final    Call the laboratory for assistance in cases with discordance between screen & confirmation    Note: for patients <2 yrs of age, it is recommended that an "HIV-1 Proviral DNA by PCR" be ordered to rule out the presence of HIV1/2 antigen or  antibody from maternal origin.   . WBC 10/22/2015 12.3* 3.5 - 11.0 x10^3/uL Final   . RBC 10/22/2015 4.67  3.63 - 4.92 x10^6/uL Final   . HGB 10/22/2015 14.1  11.2 - 15.2 g/dL Final   . HCT 54/09/8117 40.6  33.5 - 45.2 % Final   . MCV 10/22/2015 87.1  78.0 - 100.0 fL Final   . MCH 10/22/2015 30.2  27.4 - 33.0 pg Final   . MCHC 10/22/2015 34.6  32.5 - 35.8 g/dL Final   . RDW 14/78/2956 11.8* 12.0 - 15.0 % Final   . PLATELETS 10/22/2015 352  140 - 450 x10^3/uL Final   . MPV 10/22/2015 7.6  7.5 - 11.5 fL Final   . SPECIFIC GRAVITY 10/23/2015 1.010  1.005 - 1.030 Final   . GLUCOSE 10/23/2015 Negative  Negative mg/dL Final   . PROTEIN 21/30/8657 30 * Negative mg/dL Final   . BILIRUBIN 84/69/6295 Negative  Negative mg/dL Final   . UROBILINOGEN 10/23/2015 Negative  Negative mg/dL Final   . PH 28/41/3244 5.0  5.0 - 8.0 Final   . BLOOD 10/23/2015 Large* Negative mg/dL Final   . KETONES 01/09/7251 Negative  Negative mg/dL Final   . NITRITE 66/44/0347 Negative  Negative Final   . LEUKOCYTES 10/23/2015 Moderate* Negative WBCs/uL Final   . APPEARANCE 10/23/2015 Cloudy* Clear Final   . COLOR 10/23/2015 Normal (Yellow)  Normal (Yellow) Final   . NEUTROPHIL % 10/22/2015 75  % Final   . LYMPHOCYTE % 10/22/2015 20  % Final   . MONOCYTE % 10/22/2015 1  % Final   . EOSINOPHIL % 10/22/2015 4  % Final   . NEUTROPHIL ABSOLUTE 10/22/2015 9.23* 1.50 - 7.70 x10^3/uL Final   . LYMPHOCYTE ABSOLUTE 10/22/2015 2.46  1.00 - 4.80 x10^3/uL Final   . MONOCYTE ABSOLUTE 10/22/2015 0.12* 0.30 - 1.00 x10^3/uL Final   . EOSINOPHIL ABSOLUTE 10/22/2015 0.49  0.00 - 0.50 x10^3/uL Final   . RBC MORPHOLOGY COMMENT 10/22/2015 Normal   Final   . PLATELET MORPHOLOGY COMMENT 10/22/2015 Normal   Final   . WBC 10/22/2015 12.3  x10^3/uL Final   . TRICHOMONAS 10/23/2015 Absent  Absent Final   . YEAST 10/23/2015 Absent  Absent Final   . CLUE CELLS 10/23/2015 Absent  Absent Final   . NEISSERIA GONORRHEA PCR 10/23/2015 Not Detected  Not Detected Final   . CHLAMYDIA  TRACHOMATIS PCR 10/23/2015 Not Detected  Not Detected Final   . WBCS 10/23/2015 56.0* <11.0 /hpf Final   . RBCS 10/23/2015 >182.0* <6.0 /hpf Final   .  BACTERIA 10/23/2015 Occasional or less  Occasional or less /hpf Final   . SQUAMOUS EPITHELIAL CELLS 10/23/2015 Occasional or less  Occasional or less /lpf Final   . URINE CULTURE 10/23/2015 <10000 CFU/mL Mixed Commensal Flora   Final         Reviewed imaging results: x-ray feet bilaterally 3/18, x-ray ankles bilaterally 3/18, standing knee x-ray bilaterally 09/13/2016. Per radiology read, no specific evidence of inflammatory arthritis on these x-rays. MRI pelvis 3/18 with radiographic impression "No evidence of sacroiliitis."    Assessment/Plan  1. Back pain, unspecified back location, unspecified back pain laterality, unspecified chronicity    2. Psoriasis    3. Elevated liver function tests    4. Vitamin D deficiency      1. Back pain  Patient has back pain in the lumbosacral spine that does have some characteristics of inflammatory disease including getting better with movement, however is not clear at this is an inflammatory process occurring based on the history.  X-ray of the lumbar spine ordered and may need to be followed by MRI depending on results. We have also ordered Flexeril  5 mg at bedtime for the patient.  We will repeat liver function tests and if not elevated will consider starting the patient on NSAID for the back pain. Will hold off on starting a biologic agent as this is not a very clear psoriatic arthritis process occurring.    2. Psoriasis  No psoriatic plaques seen on exam today.  Psoriasis is being followed by Dermatology, and patient states she is going to be seeing Dr. Edwina Barth on 10/23/16 for a second opinion. Patient is currently off Stelara and last injection was 03/2016.    3. Elevated liver function tests  AST 43 and ALT 81 on 08/17 while patient was on ibuprofen and naproxen.  Patient advised to hold off on NSAIDs for now and we will repeat  liver function tests.  If liver function tests are not elevated, consider starting the patient on NSAID like Mobic or Celebrex as above.    4.  Vitamin D deficiency  Vit D 25, total decreased at 29 on 08/23/2016. Continue with vitamin D supplementation.    All of patients questions were answered and plan discussed in great detail.   Patient was advised to contact the office within two weeks of any testing (e.g. Labs, xrays or any other testing)  if they have not been contacted by our office with the results and recommendations and follow up plan. This can be a phone call or a letter in the mail.     Patient was counseled that if follow up is planned to discuss results and work up and they are unable to keep their appointment, they must reschedule or contact our office.     Orders Placed This Encounter   . XR LUMBAR SPINE SERIES   . cyclobenzaprine (FLEXERIL) 5 mg Oral Tablet     Carl Best, DO PGY-3 09/18/2016, 16:56    I saw and examined the patient.  I reviewed the resident's note.  I agree with the findings and plan of care as documented in the resident's note.  Any exceptions/additions are edited/noted. Letter given for her employer. Encourage movement. Change NSAID. Discussed liver enzymes. Repeat today. Try therapy     Consuello Bossier, MD

## 2016-09-20 ENCOUNTER — Telehealth (HOSPITAL_BASED_OUTPATIENT_CLINIC_OR_DEPARTMENT_OTHER): Payer: Self-pay

## 2016-09-20 NOTE — Telephone Encounter (Signed)
Patient notified of Pap/HPV results and recommendations per Golden Pop. Patient verbalized understanding. Lanier Clam, RN  09/20/2016, 13:08

## 2016-09-20 NOTE — Telephone Encounter (Signed)
[facility] Patient      36 yo G1 P0010  2012 ? Abn Pap with Colposcopy in [place], [place]  ? Results     09/02/2016  Final Cytologic Diagnosis  A. Cervical/Endocervical Thin Prep Cytology:  Satisfactory for evaluation. Endocervical/squamous metaplastic cells present.  NEGATIVE FOR INTRAEPITHELIAL LESION OR MALIGNANCY  Specimen sent for HPV Testing.    09/18/2016 2:23 PM - Cleta [place], [place]   Component Results   Component Value Ref Range & Units Status   HPV16 PCR Negative Negative Final   HPV18 PCR Negative Negative Final   HPV OTHER Positive (A) Negative Final   Narrative   Specimen is positive for the DNA of any one of, or combination of, the following high risk HPV types: 31, 33, 35, 39, 45, 51, 52, 56, 58, 59, 66, 68. HPV 16 and 18 DNA were undetectable orbelow the pre set threshold.    Performed by PCR using the COBAS 4800 System, HPV test.     Plan: Becky, please call her and tell her that I am recommending Repeat of Pap + HR testing in 1 year since ~ 5% of women will have + Other HR types that will resolve and revert back to Negative.  Advise her to use 100% condoms for next 6-8 mos and to take Folic Acid 0.8 mg (800 mcg) daily to boost immune system.  If she has same reading next year, then needs Colposcopy.  Pap letter mailed per Ryland Monia Pouch.     Carman Ching, APRN,WHNP-BC  09/20/2016, 11:30

## 2016-09-24 ENCOUNTER — Encounter (HOSPITAL_BASED_OUTPATIENT_CLINIC_OR_DEPARTMENT_OTHER): Payer: Self-pay

## 2016-09-26 ENCOUNTER — Encounter (HOSPITAL_BASED_OUTPATIENT_CLINIC_OR_DEPARTMENT_OTHER): Payer: Self-pay | Admitting: Rheumatology

## 2016-09-26 ENCOUNTER — Telehealth (HOSPITAL_BASED_OUTPATIENT_CLINIC_OR_DEPARTMENT_OTHER): Payer: Self-pay | Admitting: Rheumatology

## 2016-09-26 NOTE — Telephone Encounter (Signed)
My chart message sent regarding diclofenac and x-ray.

## 2016-09-26 NOTE — Telephone Encounter (Signed)
-----   Message from Brewer sent at 09/26/2016 12:05 PM EDT -----  Regarding: Test Results Question  Contact: [phone]  Good Afternoon Dr. Sherryll Burger:    I was just wondering what is the new prescription you said for me to take for? And what will it do and what the effects may be? Thank you. I was just wanting to know. Thank you so much. Also, wanted to know of any thoughts and results on the back xray? Thank you.    -Joyce Hunt

## 2016-10-04 ENCOUNTER — Encounter (HOSPITAL_BASED_OUTPATIENT_CLINIC_OR_DEPARTMENT_OTHER): Payer: Self-pay | Admitting: Rheumatology

## 2016-10-04 NOTE — Telephone Encounter (Signed)
----- Message from Joyce Hunt sent at 10/04/2016  8:51 AM EDT -----  Regarding: RE: Test Results Question  Contact: [phone]  Good Morning Dr. Sherryll Burger:    Thank you for responding. That helps me better understand what I'm putting in my body. :)   Also, I wanted to let you know on Tuesday  9/25 I was at work and I wasn't able to take a 15 min. (10AM) break to get up and walk around. We ended up getting busy and I couldn't leave the desk until 1:45pm. But my back pain felt like I pulled a muscle and my whole entire right side was sore and felt like the muscle or something was being pulled in opposite directions when I leaned to answer the phone. So I couldn't sit for a while and when I finally was able to go on my break at 1:45...basically 6 hours of sitting and answering the phones and working it just felt really bad. I ended up going to employee health for some Ibuprofen because i forgot mine at home and when I got up my legs were numb and tingly and the pain shot up my neck and the entire right side. I'm not sure if it's a nerve and muscle issue but the pain starts from my back and where the bone is. Knees and ankles felt inflamed also. So for the duration of my shift I had to stand until one of the nurses suggested a heat  wrap. I put that on and it eased some of the soreness but I was still in pain when I sat or moved my right side and arm. I still felt sore in my upper right side.     Today Friday 9/28 I woke up and my body was sore for almost an hour. When I got to work this morning and was walking around a bit I started to feel the soreness and pain in my lower back and where it was aching from Tuesday. I'm sitting right now and my right side just feels inflamed and my lower back and right hip hurts. Scale of 1-10 I would say a 7-8 depending how long I sit here and not move around. I'm not sure if cutting my hours to an 8 and not do 12s so much if that would help? It's hard at times to get a break especially  when we are busy or just short staffed like Tuesday. Right now my whole body feels really tight also.    I just wanted to keep you updated on when the pain occurs and how that was going. Also, employee health said that you will have to fill out a form and fax it to them. I can bring that in on Monday if you'd like. I believe I have to give them the form along with that letter so they have in record. Please let me know if there is anything else you would recommend I do for now. Thank you so much.    -Joyce Hunt.  ----- Message -----  From: Consuello Bossier, MD  Sent: 09/26/2016  1:08 PM EDT  To: Joyce Hunt  Subject: RE: Test Results Question    The diclofenac is similar in side effects to the ibuprofen.  It can irritate the stomach so we recommend that you take it with food.  It can also elevate liver enzymes so we will be taking a look at that as well in the future.   X-ray did not show  any concern.   Final read is pending    ----- Message -----     From: Joyce Hunt     Sent: 09/26/2016 12:05 PM EDT       To: Consuello Bossier, MD  Subject: Test Results Question    Good Afternoon Dr. Sherryll Burger:    I was just wondering what is the new prescription you said for me to take for? And what will it do and what the effects may be? Thank you. I was just wanting to know. Thank you so much. Also, wanted to know of any thoughts and results on the back xray? Thank you.    -Joyce Hunt

## 2016-10-04 NOTE — Progress Notes (Signed)
[name],     I am sorry you are not feeling well. I have not received any paperwork, if you would like to bring that form in for your follow up visit,I can take a look  We are not typically equipped to evaluate in our clinic or a do a functional evaluation on how long patients can work. I can make a general recommendation that you are encouraged to take frequent breaks but cannot make a recommendation that you should work 8 hours instead of 12 hours.  Especially since we do not have a clear diagnosis or any damage on your imaging(thankfully) to correlate with your pain. I hope you understand.   Since you are having pain every day.  I think that perhaps we should consider a bone scan as discussed before to see if there is evidence of inflammation to correlate with her symptoms.  If there is then we are able to restart medications for psoriasis and can confirm that you have psoriatic arthritis.  You would have to hold all your anti-inflammatories including ibuprofen for 72 hours prior to this.  Let me know if you are agreeable to a bone scan.    I unfortunately do not have any immediate openings but you are welcome to call for cancellations are be asked to be placed on the cancellation list so we can see you sooner the November 29th. Also please confirm if you are taking diclofenac( the new prescription) or ibuprofen.  You cannot take both as that can cause havoc on your liver again.  The diclofenac sometimes tends to work better than the ibuprofen.    Sincerely,   Humberto Leep MD  Rheumatology

## 2016-10-07 NOTE — Progress Notes (Signed)
As discussed here is a copy of your x-rays.    Please let me know if you have questions.   No change in plan otherwise.     Thank you for allowing [place] to participate in your care.     Sincerely,   Humberto Leep MD  Rheumatology

## 2016-10-25 ENCOUNTER — Other Ambulatory Visit (HOSPITAL_BASED_OUTPATIENT_CLINIC_OR_DEPARTMENT_OTHER): Payer: Self-pay | Admitting: Family

## 2016-10-25 DIAGNOSIS — L409 Psoriasis, unspecified: Secondary | ICD-10-CM

## 2016-10-25 DIAGNOSIS — M2550 Pain in unspecified joint: Secondary | ICD-10-CM

## 2016-10-25 NOTE — Telephone Encounter (Signed)
----- Message from Joyce Hunt sent at 10/25/2016  9:21 AM EDT -----  Regarding: RE: Test Results Question  Contact: [phone]  Good Morning Dr. Sherryll Burger:    Sorry to have responded so late. I would very much like to have a bone scan done when possible. Also, after that and depending on the results I was wondering if I can go back on my Stelara since that was working for me very well. I have not been taking any Ib as requested until you suggest other wise. I also got a light pressure massage done two weeks ago and that helped quite a lot with the rest of the body and I was able to feel more on where the soreness was and where was hurting. My body overall felt a lot better and some of the swelling went down from my leg. Also, the pressure on my ankles were much better and a week or two after the massage I started to feel the swelling and pressure again on my right ankle and pain on my lower back (right side). Anywho, let me know about the bone scan and anything else you would like me to do. I also wanted you to know my manager has also been scheduling me for 4 12s in a row after knowing i can't sit for this long. I don't know what else to  do. I was also wondering if the employee health sent you that form? Thank you so much and I hope you have a great day.    Joyce Hunt  ----- Message -----  From: Consuello Bossier, MD  Sent: 10/04/2016 11:21 AM EDT  To: Joyce Hunt  Subject: RE: Test Results Question    Joyce Hunt,     I am sorry you are not feeling well. I have not received any paperwork, if you would like to bring that form in for your follow up visit,I can take a look  We are not typically equipped to evaluate in our clinic or a do a functional evaluation on how long patients can work. I can make a general recommendation that you are encouraged to take frequent breaks but cannot make a recommendation that you should work 8 hours instead of 12 hours.  Especially since we do not have a clear diagnosis or any damage  on your imaging(thankfully) to correlate with your pain. I hope you understand.   Since you are having pain every day.  I think that perhaps we should consider a bone scan as discussed before to see if there is evidence of inflammation to correlate with her symptoms.  If there is then we are able to restart medications for psoriasis and can confirm that you have psoriatic arthritis.  You would have to hold all your anti-inflammatories including ibuprofen for 72 hours prior to this.  Let me know if you are agreeable to a bone scan.    I unfortunately do not have any immediate openings but you are welcome to call for cancellations are be asked to be placed on the cancellation list so we can see you sooner the November 29th. Also please confirm if you are taking diclofenac( the new prescription) or ibuprofen.  You cannot take both as that can cause havoc on your liver again.  The diclofenac sometimes tends to work better than the ibuprofen.    Sincerely,   Humberto Leep MD  Rheumatology      ----- Message -----     From: Joyce Hunt  Sent: 10/04/2016  8:51 AM EDT       To: Consuello Bossier, MD  Subject: RE: Test Results Question    Good Morning Dr. Sherryll Burger:    Thank you for responding. That helps me better understand what I'm putting in my body. :)   Also, I wanted to let you know on Tuesday  9/25 I was at work and I wasn't able to take a 15 min. (10AM) break to get up and walk around. We ended up getting busy and I couldn't leave the desk until 1:45pm. But my back pain felt like I pulled a muscle and my whole entire right side was sore and felt like the muscle or something was being pulled in opposite directions when I leaned to answer the phone. So I couldn't sit for a while and when I finally was able to go on my break at 1:45...basically 6 hours of sitting and answering the phones and working it just felt really bad. I ended up going to employee health for some Ibuprofen because i forgot mine at home and when I got  up my legs were numb and tingly and the pain shot up my neck and the entire right side. I'm not sure if it's a nerve and muscle issue but the pain starts from my back and where the bone is. Knees and ankles felt inflamed also. So for the duration of my shift I had to stand until one of the nurses suggested a heat  wrap. I put that on and it eased some of the soreness but I was still in pain when I sat or moved my right side and arm. I still felt sore in my upper right side.     Today Friday 9/28 I woke up and my body was sore for almost an hour. When I got to work this morning and was walking around a bit I started to feel the soreness and pain in my lower back and where it was aching from Tuesday. I'm sitting right now and my right side just feels inflamed and my lower back and right hip hurts. Scale of 1-10 I would say a 7-8 depending how long I sit here and not move around. I'm not sure if cutting my hours to an 8 and not do 12s so much if that would help? It's hard at times to get a break especially when we are busy or just short staffed like Tuesday. Right now my whole body feels really tight also.    I just wanted to keep you updated on when the pain occurs and how that was going. Also, employee health said that you will have to fill out a form and fax it to them. I can bring that in on Monday if you'd like. I believe I have to give them the form along with that letter so they have in record. Please let me know if there is anything else you would recommend I do for now. Thank you so much.    -Joyce C.  ----- Message -----  From: Consuello Bossier, MD  Sent: 09/26/2016  1:08 PM EDT  To: Joyce Hunt  Subject: RE: Test Results Question    The diclofenac is similar in side effects to the ibuprofen.  It can irritate the stomach so we recommend that you take it with food.  It can also elevate liver enzymes so we will be taking a look at that as well in the future.   X-ray  did not show any concern.   Final read is  pending    ----- Message -----     From: Joyce PerchesPriya Battiste     Sent: 09/26/2016 12:05 PM EDT       To: Consuello BossierHajra Zehra Shah, MD  Subject: Test Results Question    Good Afternoon Dr. Sherryll BurgerShah:    I was just wondering what is the new prescription you said for me to take for? And what will it do and what the effects may be? Thank you. I was just wanting to know. Thank you so much. Also, wanted to know of any thoughts and results on the back xray? Thank you.    -Joyce Hunt

## 2016-11-20 ENCOUNTER — Ambulatory Visit
Admission: RE | Admit: 2016-11-20 | Discharge: 2016-11-20 | Disposition: A | Discharge status: Home / Self Care | Payer: BLUE CROSS/BLUE SHIELD | Source: Ambulatory Visit | Attending: Family | Admitting: Family

## 2016-11-20 ENCOUNTER — Ambulatory Visit (HOSPITAL_COMMUNITY)
Admission: RE | Admit: 2016-11-20 | Discharge: 2016-11-20 | Disposition: A | Discharge status: Home / Self Care | Payer: BLUE CROSS/BLUE SHIELD | Source: Ambulatory Visit | Attending: Family | Admitting: Family

## 2016-11-20 DIAGNOSIS — M2550 Pain in unspecified joint: Secondary | ICD-10-CM | POA: Insufficient documentation

## 2016-11-20 DIAGNOSIS — L409 Psoriasis, unspecified: Secondary | ICD-10-CM | POA: Insufficient documentation

## 2016-12-05 ENCOUNTER — Ambulatory Visit (HOSPITAL_BASED_OUTPATIENT_CLINIC_OR_DEPARTMENT_OTHER): Payer: Self-pay | Admitting: Rheumatology

## 2016-12-05 ENCOUNTER — Encounter (HOSPITAL_BASED_OUTPATIENT_CLINIC_OR_DEPARTMENT_OTHER): Payer: Self-pay | Admitting: Rheumatology

## 2016-12-05 NOTE — Telephone Encounter (Signed)
Regarding: Apology/ call request regarding test results  ----- Message from Bobby RumpfMadison R Killmeyer sent at 12/05/2016 11:56 AM EST -----  Consuello BossierShah, Hajra Zehra, MD    Pt called to apologize stating that she had gotten called in at the last minute to work.     She was also wanting to know if you were able to call her since this was to go over her test results. Please call pt to advise.

## 2016-12-05 NOTE — Telephone Encounter (Signed)
Routing to provider  

## 2016-12-06 ENCOUNTER — Encounter (HOSPITAL_BASED_OUTPATIENT_CLINIC_OR_DEPARTMENT_OTHER): Payer: Self-pay | Admitting: Rheumatology

## 2016-12-06 NOTE — Progress Notes (Signed)
Hi 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   I think it is probably all related to your psoriatic arthritis if anything but I will place orders for Rheumatoid arthritis evaluation in the system.   You could discuss with your dermatologist if they are willing to do Xeljanz.  It has similar side effects to Stelara. Not sure if it will work as well for the back so if you want to go back on Stelara (since I believe you felt better on it) you are able to do that from my end  It's a daily pill that is approved for rheumatoid arthritis and now has recently been approved for psoriasis and psoriatic arthritis as well.  It is considered a biologic like Stelara is.    I am afraid I cannot comment on some of the concerns that you have raised till you see me in clinic and I have an opportunity to examine you. You could check with your family doctor should you wish.    Unfortunately I am booked up for this month.  Please feel free to call for cancellations in the meantime.     I had mentioned you were on diclofenac. That was a prescription I had given to you on your last visit.  I am happy to send a refill in for that if you felt that it was helping you.  If not we can try Celebrex if you have not previously tried that.    Sincerely,    Sadie Hazelett Z. Sherryll BurgerShah MD  Rheumatology

## 2016-12-06 NOTE — Telephone Encounter (Signed)
Her bone scan is reported negative for uptake consistent with psoriatic arthritis. I am personally concerned about inflammation in her wrists on the scan.  I reached out to radiology to clarify but they did not get back to me yet. She will need to call for cancellations or placed on back up list so we can see her in follow up and see what we need to do next or if we need to make any medication changes. She should take regular diclofenac. I can change it to another NSAID if that is not working for her

## 2016-12-06 NOTE — Telephone Encounter (Signed)
Called and advised pt of the below message. Pt verbalizes understanding. Pt states that she is at work today but will see if she can come in today. Pt will be calling us back to see if she can come in.  Cyndia SkeetersKayisha Malyn Aytes, MA  12/06/2016, 10:29

## 2016-12-06 NOTE — Telephone Encounter (Signed)
-----   Message from DelphosPriya Niccoli sent at 12/06/2016 11:23 AM EST -----  Regarding: Test Results Question  Contact: [phone]  Good Morning Dr. Sherryll BurgerShah:    I just wanted to follow up that I received a phone call from your nurses in regards to my results stating that I'm negative for psoriatic arthritis. However, you did see inflammation in my hands (wrists) that indicate some concern and that you are waiting for a feedback from radiology.     In regards to the medication to retake, what was that for again? And will the pharmacy contact me when its ready? Or is it something I can get over the counter?     I do apologize for the work mix up and not being able to come in. I tried to get coverage but the other clerk couldn't make it and you know how my manager can be. Anywho, I did schedule another appointment for March and if anything sooner pops up for them to call me. I've also been experiencing some swelling on my right leg and numbing feeling at times, almost as if it's a circulation issue but not sure. My back still aches and gets really sore and have been trying to get my manager to switch my days and hours so it's not so long. I paid my PT bill and so will be starting that back up again because my knees and ankles are still sore and gets stiff sometimes.    In regards to the Inflammation how bad is it and what are the long term effects?   Also, my dermatologist would like to put me back on Stelara but I told her I was waiting to hear back from you and your thoughts on the tests. I am also beginning to have some light plaque flare ups but I can wait a bit more.    Thank you so much for being so patient and thorough with me. If there is anything else you would like me to do in the mean or need to take please let me know.    Joyce Hunt  [phone]

## 2016-12-06 NOTE — Telephone Encounter (Signed)
-----   Message from [facility] sent at 12/06/2016 10:29 AM EST -----  Dr.Shah  Pt is returning a call stating that she is at work and couldn't come in today but if its important that she does need to come in she could try to leave work early.Pt would like you to call her back at [phone] or [phone]

## 2016-12-06 NOTE — Telephone Encounter (Signed)
Returned call. Advised pt of the entire below message. Pt scheduled soonest available and placed on waitlist.  Cyndia SkeetersKayisha Mallie Giambra, MA  12/06/2016, 10:33

## 2016-12-07 NOTE — Progress Notes (Signed)
Hi Ms. 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   I think it is probably all related to your psoriatic arthritis if anything but I will place orders for Rheumatoid arthritis evaluation in the system.   You could discuss with your dermatologist if they are willing to do Xeljanz.  It has similar side effects to Stelara. Not sure if it will work as well for the back so if you want to go back on Stelara (since I believe you felt better on it) you are able to do that from my end  It's a daily pill that is approved for rheumatoid arthritis and now has recently been approved for psoriasis and psoriatic arthritis as well.  It is considered a biologic like Stelara is.    I am afraid I cannot comment on some of the concerns that you have raised till you see me in clinic and I have an opportunity to examine you. You could check with your family doctor should you wish.    Unfortunately I am booked up for this month.  Please feel free to call for cancellations in the meantime.     I had mentioned you were on diclofenac. That was a prescription I had given to you on your last visit.  I am happy to send a refill in for that if you felt that it was helping you.  If not we can try Celebrex if you have not previously tried that.    Sincerely,    Chrisanna Mishra Z. Grethel Zenk MD  Rheumatology

## 2016-12-16 ENCOUNTER — Other Ambulatory Visit (HOSPITAL_BASED_OUTPATIENT_CLINIC_OR_DEPARTMENT_OTHER): Payer: Self-pay | Admitting: Rheumatology

## 2016-12-16 DIAGNOSIS — M2550 Pain in unspecified joint: Secondary | ICD-10-CM

## 2016-12-16 NOTE — Progress Notes (Signed)
Here is a copy of your results.   As mentioned previously.   The radiologist just got back to me.   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  I think it is probably all related to your psoriatic arthritis if anything but I will place orders for Rheumatoid arthritis evaluation in the system. (please get them done when you can).    You could discuss with your dermatologist if they are willing to do Xeljanz. It has similar side effects to Stelara. Not sure if it will work as well for the back so if you want to go back on Stelara (since I believe you felt better on it) you are able to do that from my end. It's a daily pill that is approved for rheumatoid arthritis and now has recently been approved for psoriasis and psoriatic arthritis as well. It is considered a biologic like Stelara is.     I see that you have been placed on the wait list.     Sincerely,    Leighla Chestnutt Z. Sherryll BurgerShah MD  Rheumatology

## 2016-12-26 ENCOUNTER — Ambulatory Visit: Payer: BLUE CROSS/BLUE SHIELD | Attending: Rheumatology | Admitting: Rheumatology

## 2016-12-26 ENCOUNTER — Ambulatory Visit (HOSPITAL_BASED_OUTPATIENT_CLINIC_OR_DEPARTMENT_OTHER): Payer: BLUE CROSS/BLUE SHIELD

## 2016-12-26 ENCOUNTER — Encounter (HOSPITAL_BASED_OUTPATIENT_CLINIC_OR_DEPARTMENT_OTHER): Payer: Self-pay | Admitting: Rheumatology

## 2016-12-26 ENCOUNTER — Other Ambulatory Visit (INDEPENDENT_AMBULATORY_CARE_PROVIDER_SITE_OTHER): Payer: Self-pay | Admitting: Pharmacist

## 2016-12-26 VITALS — BP 124/72 | HR 87 | Temp 97.1°F | Wt 131.2 lb

## 2016-12-26 DIAGNOSIS — M2550 Pain in unspecified joint: Principal | ICD-10-CM | POA: Insufficient documentation

## 2016-12-26 DIAGNOSIS — R252 Cramp and spasm: Secondary | ICD-10-CM | POA: Insufficient documentation

## 2016-12-26 DIAGNOSIS — Z79899 Other long term (current) drug therapy: Secondary | ICD-10-CM | POA: Insufficient documentation

## 2016-12-26 DIAGNOSIS — Z791 Long term (current) use of non-steroidal anti-inflammatories (NSAID): Secondary | ICD-10-CM | POA: Insufficient documentation

## 2016-12-26 DIAGNOSIS — L409 Psoriasis, unspecified: Secondary | ICD-10-CM

## 2016-12-26 DIAGNOSIS — Z6823 Body mass index (BMI) 23.0-23.9, adult: Secondary | ICD-10-CM

## 2016-12-26 DIAGNOSIS — Z9181 History of falling: Secondary | ICD-10-CM | POA: Insufficient documentation

## 2016-12-26 DIAGNOSIS — M545 Low back pain: Secondary | ICD-10-CM | POA: Insufficient documentation

## 2016-12-26 LAB — RHEUMATOID FACTOR, SERUM: RHEUMATOID FACTOR: <15 [IU]/mL (ref <=30)

## 2016-12-26 LAB — HEPATITIS B SURFACE ANTIBODY: HBV SURFACE ANTIBODY QUANTITATIVE: 28 m[IU]/mL — ABNORMAL HIGH (ref <8)

## 2016-12-26 LAB — CYCLIC CITRULLINATED PEPTIDE ANTIBODIES, IGG, SERUM: CYCLIC CITRULLINATED PEPTIDE ANTIBODY IGG QUANT: <0.5 U/mL (ref <5.0)

## 2016-12-26 LAB — HEPATITIS B CORE ANTIBODY: HBV CORE TOTAL ANTIBODIES: NEGATIVE

## 2016-12-26 LAB — HEPATITIS B SURFACE ANTIGEN: HBV SURFACE ANTIGEN QUALITATIVE: NEGATIVE

## 2016-12-26 MED ORDER — DICLOFENAC SODIUM 50 MG TABLET,DELAYED RELEASE: 50 mg | Tab | Freq: Two times a day (BID) | ORAL | 2 refills | 0 days | Status: CN | PRN

## 2016-12-26 MED ORDER — DICLOFENAC SODIUM 50 MG TABLET,DELAYED RELEASE: 50 mg | Tab | Freq: Two times a day (BID) | ORAL | 2 refills | 0 days | Status: DC | PRN

## 2016-12-26 MED ORDER — ETANERCEPT 50 MG/ML (1 ML) SUBCUTANEOUS PEN INJECTOR
50.0000 mg | PEN_INJECTOR | SUBCUTANEOUS | 3 refills | Status: DC
Start: 2016-12-26 — End: 2017-03-27

## 2016-12-26 NOTE — Progress Notes (Signed)
Subjective  Joyce Hunt is a 36 y.o. year old female who presents for Joint Pain   to clinic.  Last seen 09/18/2016.  She has not restarted Stelara yet. She did get a second opinion from Dermatology, Dr. Aggie Moats. She is beginning to break out in rashes again and states peeling inside her ear. She continues to be itchy and is feeling more joint pain. She continues ibuprofen for pain when pain is significant but has been trying to take less. Back pain will come and go and does get better some days. She will be beginning physical therapy soon, she has tried to do more stretching on her own.  She is planning pregnancy at the end of 2019.   She does not recall starting diclofenac for pain.     History:  Patient has history of psoriasis for which she was on Stelara for approximately 3-4 years. Her last injection was 03/2016, after which time she had changed dermatologists to Dr. Lorenso Courier. Dr. Lorenso Courier recommended stopping Stelara and seeing how patient did off this medication.  Patient states she has flaking of skin in the periauricular areas bilaterally and on the posterior aspect of her right leg, however she denies significant plaques and states she is controlling her psoriasis with diet, exercise, moisturizing lotion, and stress management.    Patient does admit to low back pain in the lumbosacral region which started after a mechanical fall on November 2017 where she was walking up steps, had missed a step, and fell backward and landed on her sacral region on brick floor.  She states that she went to an urgent care and had x-ray films performed there which were negative for fracture.  She has since been having pain in the center of her spine in the lumbosacral region that radiates up the spine to the upper lumbar area, is worse when she changes positions and gets up from sitting position, and is worse toward the end of the day.  The pain does wake her up in the middle of the night approximately 3 times a week and  she does state that the pain is better with movement.  Patient has been controlling her pain with ibuprofen and naproxen, and she states her pain is 5/10 today. She was recently told to hold ibuprofen and naproxen secondary to elevated liver enzymes on 8/17 (AST 43, ALT 81), however she states the pain is too high without the NSAIDs and she restarted taking them.    Patient also admits to right knee pain which she has had for the last 7 years and right ankle pain which she has had since a separate mechanical fall last year.  Both her knee and ankle pain have morning stiffness that lasts approximately 10 min, and her pain and stiffness get better with activity.  She states that she has intermittent swelling in the right knee and ankle and that the pain is worst toward the end of the day.  She has 0/10 pain during our interview today.      Current Outpatient Medications   Medication Sig   . cholecalciferol, vitamin D3, 1,000 unit Oral Tablet Take 2,000 Units by mouth Once a day   . cyclobenzaprine (FLEXERIL) 5 mg Oral Tablet Take 1 Tab (5 mg total) by mouth Every night (Patient not taking: Reported on 12/26/2016)   . diclofenac sodium (VOLTAREN) 50 mg Oral Tablet, Delayed Release (E.C.) Take 1 Tab (50 mg total) by mouth Twice per day as needed   .  Etanercept (ENBREL SURECLICK) 50 mg/mL (0.98 mL) Subcutaneous Pen Injector 50 mg by Subcutaneous route Every 7 days   . naproxen sodium (ANAPROX) 550 mg Oral Tablet Take 1 Tab (550 mg total) by mouth Twice daily with food 24 hours before/during/after menses pain     Family history, medical history and surgical history reviewed in the system and updated to reflect any changes since last visit    Objective  Vitals: BP 124/72   Pulse 87   Temp 36.2 C (97.1 F) (Thermal Scan)   Wt 59.5 kg (131 lb 2.8 oz)   SpO2 97%   BMI 23.99 kg/m   General: appears well - no acute distress - no oral ulcers  Cardiovascular: no obvious murmurs, cardiac are sounds are not distant    Gastrointestinal: no distension or tenderness with palpation  Lungs: no wheezes, no crackles - breath sounds not decreased   Joints: No synovitis or deformities  Extremities: no peripheral edema  Skin: no rashes    Labs/X-rays/Work up  Appointment on 12/26/2016   Component Date Value Ref Range Status   . RHEUMATOID FACTOR 12/26/2016 <15  <=30 IU/mL Final   . CYCLIC CITRULLINATED PEPTIDE ANTIB* 12/26/2016 <0.5  <5.0 U/mL Final   . HBV SURFACE ANTIBODY QUANTITATIVE 12/26/2016 28* <8 mIU/mL  Final      For patients who have been vaccinated against HBV and have a "Negative" or "Equivocal" result, re-vaccination or booster injection may be indicated.  A "Positive" result suggests an adequate vaccination response.  Hepatitis B Surface Antibody Reference Ranges:                                           <8 mIU/mL                    Negative or Unvaccinated                             >= 8 and < 12 mIU/mL         Equivocal/Indeterminate                              >= 12 mIU/mL                 Reactive or Vaccinated                         CMIA Method by [employer]                                                    . HBV SURFACE ANTIGEN QUALITATIVE 12/26/2016 Negative  Negative Final      Specimen is NEGATIVE for Hepatitis B surface antigen. The reference range for this test is NEGATIVE. CMIA Method by Architect.   Marland Kitchen HBV CORE TOTAL ANTIBODIES 12/26/2016 Negative  Negative Final      CMIA Method by Architect   Appointment on 09/18/2016   Component Date Value Ref Range Status   . ALBUMIN 09/18/2016 4.1  3.5 - 5.0 g/dL Final   . ALKALINE PHOSPHATASE 09/18/2016 94  <150 U/L Final   .  ALT (SGPT) 09/18/2016 47  <55 U/L Final   . AST (SGOT) 09/18/2016 30  8 - 41 U/L Final   . BILIRUBIN TOTAL 09/18/2016 0.3  0.3 - 1.3 mg/dL Final   . BILIRUBIN DIRECT 09/18/2016 0.1  <0.3 mg/dL Final   . PROTEIN TOTAL 09/18/2016 8.1  6.4 - 8.3 g/dL Final   Appointment on 09/02/2016   Component Date Value Ref Range Status   . HIV SCREEN, COMBINED  ANTIGEN & ANT* 09/02/2016 Negative  Negative Final    Call the laboratory for assistance in cases with discordance between screen & confirmation    Note: for patients <2 yrs of age, it is recommended that an "HIV-1 Proviral DNA by PCR" be ordered to rule out the presence of HIV1/2 antigen or antibody from maternal origin.   Office Visit on 09/02/2016   Component Date Value Ref Range Status   . CHLAMYDIA TRACHOMATIS PCR 09/02/2016 Not Detected  Not Detected Final   . NEISSERIA GONORRHEA PCR 09/02/2016 Not Detected  Not Detected Final   . HPV16 PCR 09/02/2016 Negative  Negative Final   . HPV18 PCR 09/02/2016 Negative  Negative Final   . HPV OTHER 09/02/2016 Positive* Negative Final   Appointment on 08/23/2016   Component Date Value Ref Range Status   . ERYTHROCYTE SEDIMENTATION RATE (ES* 08/23/2016 14  0 - 20 mm/hr Final   . CRP INFLAMMATION 08/23/2016 3.0  <=8.0 mg/L Final   . ALBUMIN 08/23/2016 3.8  3.5 - 5.0 g/dL Final   . ALKALINE PHOSPHATASE 08/23/2016 97  <150 U/L Final   . ALT (SGPT) 08/23/2016 81* <55 U/L Final   . AST (SGOT) 08/23/2016 43* 8 - 41 U/L Final   . BILIRUBIN TOTAL 08/23/2016 0.5  0.3 - 1.3 mg/dL Final   . BILIRUBIN DIRECT 08/23/2016 0.2  <0.3 mg/dL Final   . PROTEIN TOTAL 08/23/2016 7.8  6.4 - 8.3 g/dL Final   . CREATININE 16/10/960408/17/2018 0.80  0.49 - 1.10 mg/dL Final   . ESTIMATED GFR 08/23/2016 >59  >59 mL/min/1.8824m^2 Final   . VITAMIN D, 25OH 08/23/2016 29* 30 - 100 ng/mL Final      Per Endocrine Society:  Deficient if <= 20 ng/mL  Insufficient if 21-29 ng/mL  Sufficient if >=30 ng/mL    Total 25-OH Vitamin D testing was performed on a BioRad BioPlex analyzer using FDA-approved protocols and multiplexed immunossay.  Vitamin D toxicity is rare and supplementation is generally considered safe; therefore, routine measurement of 25-OH vitamin D is generally not indicated and is not supported as best practice.   Appointment on 03/28/2016   Component Date Value Ref Range Status   . AST (SGOT) 03/28/2016  26  8 - 41 U/L Final   . ALT (SGPT) 03/28/2016 42  <55 U/L Final   Appointment on 03/19/2016   Component Date Value Ref Range Status   . QUANTIFERON, QUALITATIVE 03/19/2016 Negative  Negative Final    No interferon-gamma response to M. tuberculosis antigens was detected.  Infection with M. tuberculosis is unlikely.  A negative result alone does not exclude infection with M. tuberculosis.   Marland Kitchen. NIL RESULT 03/19/2016 0.02  IU/mL Final   . TB AG MINUS NIL RESULT 03/19/2016 0.00  IU/mL Final   . MITOGEN MINUS NIL RESULT 03/19/2016 11.70  IU/mL Final   . AST (SGOT) 03/19/2016 79* 8 - 41 U/L Final   . WBC 03/19/2016 7.5  3.5 - 11.0 x10^3/uL Final   . RBC 03/19/2016 4.24  3.63 - 4.92 x10^6/uL  Final   . HGB 03/19/2016 13.0  11.2 - 15.2 g/dL Final   . HCT 29/56/2130 37.5  33.5 - 45.2 % Final   . MCV 03/19/2016 88.3  78.0 - 100.0 fL Final   . MCH 03/19/2016 30.6  27.4 - 33.0 pg Final   . MCHC 03/19/2016 34.6  32.5 - 35.8 g/dL Final   . RDW 86/57/8469 12.4  12.0 - 15.0 % Final   . PLATELETS 03/19/2016 319  140 - 450 x10^3/uL Final   . MPV 03/19/2016 8.0  7.5 - 11.5 fL Final   . NEUTROPHIL % 03/19/2016 51  % Final   . LYMPHOCYTE % 03/19/2016 41  % Final   . MONOCYTE % 03/19/2016 7  % Final   . EOSINOPHIL % 03/19/2016 1  % Final   . BASOPHIL % 03/19/2016 0  % Final   . NEUTROPHIL # 03/19/2016 3.78  1.50 - 7.70 x10^3/uL Final   . LYMPHOCYTE # 03/19/2016 3.02  1.00 - 4.80 x10^3/uL Final   . MONOCYTE # 03/19/2016 0.54  0.30 - 1.00 x10^3/uL Final   . EOSINOPHIL # 03/19/2016 0.09  0.00 - 0.50 x10^3/uL Final   . BASOPHIL # 03/19/2016 0.02  0.00 - 0.20 x10^3/uL Final   Appointment on 12/29/2015   Component Date Value Ref Range Status   . GLUCOSE FASTING 12/29/2015 83  70 - 105 mg/dL Final   . ERYTHROCYTE SEDIMENTATION RATE (ES* 12/29/2015 17  0 - 20 mm/hr Final   . CRP INFLAMMATION 12/29/2015 <1.0  <=8.0 mg/L Final   . CREATINE KINASE 12/29/2015 63  25 - 190 U/L Final   . VITAMIN D, 25OH 12/29/2015 23* 30 - 100 ng/mL Final      Per  Endocrine Society:  Deficient if <= 20 ng/mL  Insufficient if 21-29 ng/mL  Sufficient if >=30 ng/mL    Total 25-OH Vitamin D testing was performed on a BioRad BioPlex analyzer using FDA-approved protocols and multiplexed immunossay.  Vitamin D toxicity is rare and supplementation is generally considered safe; therefore, routine measurement of 25-OH vitamin D is generally not indicated and is not supported as best practice.       Assessment/Plan  1. Psoriasis    2. Arthralgia, unspecified joint    3. Leg cramps       Reviewed results from previous bone scan which did not show inflammation within the back. She continues to have back pain occasionally. She is fearful psoriasis is beginning to return.     Plan:  --She continues to have joint pain while she has been taking Stelara. Consider Enbrel or Humira for further pain relief. I do not believe Harriette Ohara will benefit back pain. Reading material was provided. Advised that Enbrel and Humira may cause worsening of psoriasis as a potential side effect. She is agreeable to begin Enbrel authorization process today. Patient will be attempting pregnancy at the end of 2019. Advised that she can continue Enbrel if needed during pregnancy.   Will check Hep B today. TB test negative.   Recommended that she drink tonic water or Coenzyme Q for leg cramps.   --She has followed with Dr. Edwina Barth in Dermatology.   --She continues naproxen as needed for pain. She did not start diclofenac and continues ibuprofen for pain. Recommended that she try diclofenac instead of ibuprofen.   --She has received a flu vaccine.    RTC in 3-4 months, or sooner if needed.  Orders Placed This Encounter   . HEPATITIS B SURFACE ANTIBODY   .  HEPATITIS B SURFACE ANTIGEN   . HEPATITIS B CORE ANTIBODY   . Etanercept (ENBREL SURECLICK) 50 mg/mL (0.98 mL) Subcutaneous Pen Injector   . diclofenac sodium (VOLTAREN) 50 mg Oral Tablet, Delayed Release (E.C.)     I am scribing for, and in the presence of, Hardie ShackletonHajra Ivee Poellnitz, MD,  for services provided on 12/26/2016.  McKinsey Amada JupiterKirkpatrick, SCRIBE, 12/29/2016  I personally performed the services described in this documentation, as scribed  in my presence, and it is both accurate  and complete.    Consuello BossierHajra Zehra Aeisha Minarik, MD    Sudie Grumbling//Ariell Gunnels Z. Sherryll BurgerShah, MD  12/29/2016 10:58 PM  Assistant Professor  Department of Medicine, Section of Rheumatology  [employer]

## 2016-12-26 NOTE — Telephone Encounter (Signed)
Pharmacist Injection Training Note    Medication: Humira and Enbrel  Indication: RA    Joyce Hunt is a 36 y.o. female who arrived in clinic 12/26/2016 for follow-up appointment. During visit reviewed both Humira and Enbrel. Provided education including medication, storage, appropriate cleaning of the injection site, appropriate injection sites, appearance of device and medicine, when to call a pharmacist, and when to call the clinic.     Patient had the following questions: no further questions at this time- she is leaning toward Enbrel and will send provider Sherryll Burger(Shah) a message when she has decided.  Advised [facility] has received a prescription and can fill whenever she is ready.  No other concerns at this time. Instructed patient to call the pharmacy with any questions in the future.     Joyce RimAbbi Kameshia Madruga, PharmD, MBA  12/26/2016, 09:10

## 2016-12-26 NOTE — Progress Notes (Signed)
You are vaccinated against hepatitis-B.  No exposures.  Remember that for future reference.  Your also able to print out her reports for your records.    Please let me know if you have questions.   No change in plan otherwise.     Thank you for allowing us to participate in your care.     Sincerely,   Humberto LeepHajra Z Jannel Lynne MD  Rheumatology

## 2016-12-26 NOTE — Telephone Encounter (Signed)
Prior authorization not required for prescription Enbrel received by [facility] on 12/26/2016. Pharmacist provided injection training in clinic. Spoke with patient advised of $100 copay amount. She is calling to get Enbrel Support copay card and will contact [facility] back when received.

## 2017-01-09 NOTE — Telephone Encounter (Signed)
Awaiting to speak with patient after copay card is received. Patient stated she is also waiting to speak with her dermatologist before starting the Enbrel. She will call [facility] back.

## 2017-01-10 NOTE — Telephone Encounter (Signed)
Spoke with patient to follow up on if she received copay card for Enbrel. She said not yet but she is also waiting and would like to speak with her Dermatologist before starting Enbrel and that she would call us back whenever she is ready. Will place med on hold until further notice.

## 2017-01-16 NOTE — Telephone Encounter (Signed)
Awaiting patient call to activate Copay card for Enbrel. Patient also said she wanted to speak to her dermatologist as well before starting.

## 2017-01-22 ENCOUNTER — Encounter (HOSPITAL_BASED_OUTPATIENT_CLINIC_OR_DEPARTMENT_OTHER): Payer: Self-pay | Admitting: Rheumatology

## 2017-01-22 NOTE — Telephone Encounter (Signed)
Regarding: Prescription Question  Contact: [phone]  ----- Message from Mychart, Generic sent at 01/22/2017  1:00 PM EST -----    Good Morning Dr. Sherryll BurgerShah:    [facility] to [facility] and [facility] is ok with that. I just wanted to know if you would like me to wait a bit longer to go back on it or is ok to do so? I know we were talking about the Embral but I'm in classes now and I think since the Stelara has been working well for me with no side effects I would feel more comfortable going back to that. But let me know if you think I should do a test for the circulation concern I had mentioned before regarding my right leg. I'm trying to figure out a day to do my pool therapy because that was helping a lot with my joints and back and just my overall body. Please keep me posted on what you would like me to do in regards to my leg.     -Joyce C.

## 2017-01-24 ENCOUNTER — Other Ambulatory Visit (INDEPENDENT_AMBULATORY_CARE_PROVIDER_SITE_OTHER): Payer: Self-pay | Admitting: Pharmacist

## 2017-01-24 NOTE — Telephone Encounter (Signed)
Patient decided to stay on Stelara per Epic note. [facility] will place Enbrel on hold until further notice.

## 2017-01-27 ENCOUNTER — Encounter (HOSPITAL_BASED_OUTPATIENT_CLINIC_OR_DEPARTMENT_OTHER): Payer: Self-pay | Admitting: Rheumatology

## 2017-01-27 NOTE — Progress Notes (Signed)
Hi Ms. Merrily PewChand,     I apologize for the delay, I have been out all last week and only got in today.  I am ok with you trying the Stelera.   By circulation problems if you means the cramping in your leg, I would recommend that you discuss this further with your family physician at this time.  I do not have a clear explanation from the rheumatological perspective but of course you can also restart the Stelara and see if some of that disappears.  PCP can re-evaluate for circulation problems and can order further testing as needed    Sincerely,    Hajra Z. Sherryll BurgerShah MD  Rheumatology

## 2017-02-04 LAB — HIV1/HIV2 SCREEN, COMBINED ANTIGEN AND ANTIBODY: HIV SCREEN, COMBINED ANTIGEN & ANTIBODY: NEGATIVE

## 2017-03-24 ENCOUNTER — Other Ambulatory Visit (INDEPENDENT_AMBULATORY_CARE_PROVIDER_SITE_OTHER): Payer: Self-pay | Admitting: Pharmacist

## 2017-03-24 NOTE — Telephone Encounter (Signed)
Received Stelara script for Restart. No PA needed. [facility] will reach out to patient to set up delivery.

## 2017-03-27 ENCOUNTER — Other Ambulatory Visit (INDEPENDENT_AMBULATORY_CARE_PROVIDER_SITE_OTHER): Payer: Self-pay | Admitting: Pharmacist

## 2017-03-27 NOTE — Telephone Encounter (Signed)
Specialty Pharmacy Assessment Note  Assessment Completed: 03/27/2017  Treatment Regimen: Stelara 45mg  subcut every 12 weeks  Diagnosis: Psoriasis  Patient: Joyce Hunt is a 37 y.o. female    Contacted patient for Stelara education.  Noted medication to be picked up by patient on 03/28/17. Provided education re: medication storage, dosing and administration, potential side effects, as well as how to manage potential missed doses.  She plans to take this to the doctor for injections at least initially. She has been on Stelara previously with good benefit and notes she also has a diagnosis of joint pain as well. She mentions she had some nausea with injections before but this was not to the point of vomiting and she said it did resolve. She is currently getting over a cold and thought about going to urgent care for evaluation. Discussed what to do when sick. She denies fever or abx use. She will pick up Stelara but will make appt with prescriber for injection once feeling better.     Patient plans to take medication every 12 weeks at MD office initially.  Obtained updated medication list, comorbids, and allergies from patient. No longer taking diclofenac, flexeril, and not using clobetasol solution. She is taking a daily MVI.  No significant drug-drug interactions expected.  Epic chart reviewed for medications/allergies, most recent labs, and comorbid conditions. No concern for interactions or contraindications to treatment. TB negative 03/2016.     Patient endorses the following symptoms:  Psoriasis patches on her lower legs (calves), also scalp and in ears. States it's more plaque like and not as itchy as it was before, but she feels it's getting worse. Rates overall symptoms as an 10/10. These are really bothersome to her and she'd like to start therapy and resolve this. Did have good results per pt when she was on this previously.      Ms. Merrily PewChand had the following questions: wanted to know if this would work on joint  pains. Discussed can be used specifically for psoriatic arthritis with good data supporting it.  Advised patient to contact [facility] [phone]) with future questions or concerns.  Patient expressed appreciation of information.    Flo ShanksRachel Hiroki Wint  03/27/2017, 14:34

## 2017-04-01 ENCOUNTER — Encounter (HOSPITAL_BASED_OUTPATIENT_CLINIC_OR_DEPARTMENT_OTHER): Payer: Self-pay | Admitting: Rheumatology

## 2017-04-07 MED FILL — Stelara 45 Mg/0.5ml Pfs: 84 days supply | Qty: 1 | Fill #0 | Status: CP

## 2017-06-03 ENCOUNTER — Encounter (HOSPITAL_BASED_OUTPATIENT_CLINIC_OR_DEPARTMENT_OTHER): Payer: Self-pay | Admitting: Rheumatology

## 2017-06-11 ENCOUNTER — Ambulatory Visit: Payer: BLUE CROSS/BLUE SHIELD | Attending: Rheumatology | Admitting: Rheumatology

## 2017-06-11 ENCOUNTER — Ambulatory Visit (HOSPITAL_BASED_OUTPATIENT_CLINIC_OR_DEPARTMENT_OTHER): Payer: BLUE CROSS/BLUE SHIELD

## 2017-06-11 ENCOUNTER — Encounter (HOSPITAL_BASED_OUTPATIENT_CLINIC_OR_DEPARTMENT_OTHER): Payer: Self-pay | Admitting: Rheumatology

## 2017-06-11 VITALS — BP 118/68 | HR 72 | Temp 97.2°F | Wt 128.1 lb

## 2017-06-11 DIAGNOSIS — Z79899 Other long term (current) drug therapy: Secondary | ICD-10-CM

## 2017-06-11 DIAGNOSIS — M2550 Pain in unspecified joint: Secondary | ICD-10-CM | POA: Insufficient documentation

## 2017-06-11 DIAGNOSIS — L409 Psoriasis, unspecified: Principal | ICD-10-CM | POA: Insufficient documentation

## 2017-06-11 LAB — AST (SGOT): AST (SGOT): 29 U/L (ref 8–41)

## 2017-06-11 LAB — ALBUMIN: ALBUMIN: 4 g/dL (ref 3.5–5.0)

## 2017-06-11 LAB — ALT (SGPT): ALT (SGPT): 52 U/L (ref <55)

## 2017-06-11 LAB — CREATININE WITH EGFR
CREATININE: 0.94 mg/dL (ref 0.49–1.10)
ESTIMATED GFR: >59 mL/min/1.73mˆ2 (ref >59)

## 2017-06-11 MED ORDER — MELOXICAM 15 MG TABLET: 15 mg | Tab | Freq: Every day | ORAL | 3 refills | 0 days | Status: DC | PRN

## 2017-06-11 NOTE — Progress Notes (Signed)
Subjective  Joyce Hunt is a 37 y.o. year old female who presents for Other (psoriasis)   to clinic.  Last seen 12/26/16  Pt is on Stelara for one month now and is tolerating. She is having lesions developing on her legs. Pt has been informed that these are scars secondary to psoriasis by dermatology. Stelara has been helping her joint pain and psoriasis. She has been exercising and stretching more regularly. Pt is feeling improved with the exception of lower back pain after prolonged times of sitting. She is still having morning stiffness but it is improved. Pt has discontinued diclofenac. She is taking Ibuprofen prn.       Current Outpatient Medications   Medication Sig   . cholecalciferol, vitamin D3, 1,000 unit Oral Tablet Take 2,000 Units by mouth Once a day   . meloxicam (MOBIC) 15 mg Oral Tablet Take 1 Tab (15 mg total) by mouth Once per day as needed for Pain   . multivitamin Oral Tablet Take 1 Tab by mouth Once a day   . ustekinumab (STELARA) 45 mg/0.5 mL Subcutaneous Syringe 45 mg by Subcutaneous route Every 3 months     Family history, medical history and surgical history reviewed in the system and updated to reflect any changes since last visit    Objective  Vitals: BP 118/68   Pulse 72   Temp 36.2 C (97.2 F) (Thermal Scan)   Wt 58.1 kg (128 lb 1.4 oz)   SpO2 98%   BMI 23.43 kg/m   General: appears well - no acute distress - no oral ulcers  Cardiovascular: no obvious murmurs, cardiac are sounds are not distant   Gastrointestinal: no distension or tenderness with palpation  Lungs: no wheezes, no crackles - breath sounds not decreased   Joints: No synovitis or deformities  - R SIJ is tender   Extremities: no peripheral edema  Skin: no rashes -   Has some old hyperpigmented lesions.    Labs/X-rays/Work up  Appointment on 12/26/2016   Component Date Value Ref Range Status   . RHEUMATOID FACTOR 12/26/2016 <15  <=30 IU/mL Final   . CYCLIC CITRULLINATED PEPTIDE ANTIB* 12/26/2016 <0.5  <5.0 U/mL Final      . HBV SURFACE ANTIBODY QUANTITATIVE 12/26/2016 28* <8 mIU/mL  Final      For patients who have been vaccinated against HBV and have a "Negative" or "Equivocal" result, re-vaccination or booster injection may be indicated.  A "Positive" result suggests an adequate vaccination response.  Hepatitis B Surface Antibody Reference Ranges:                                           <8 mIU/mL                    Negative or Unvaccinated                             >= 8 and < 12 mIU/mL         Equivocal/Indeterminate                              >= 12 mIU/mL                 Reactive or Vaccinated  CMIA Method by Architect                                                    . HBV SURFACE ANTIGEN QUALITATIVE 12/26/2016 Negative  Negative Final      Specimen is NEGATIVE for Hepatitis B surface antigen. The reference range for this test is NEGATIVE. CMIA Method by Architect.   Marland Kitchen HBV CORE TOTAL ANTIBODIES 12/26/2016 Negative  Negative Final      CMIA Method by Architect   Appointment on 09/18/2016   Component Date Value Ref Range Status   . ALBUMIN 09/18/2016 4.1  3.5 - 5.0 g/dL Final   . ALKALINE PHOSPHATASE 09/18/2016 94  <150 U/L Final   . ALT (SGPT) 09/18/2016 47  <55 U/L Final   . AST (SGOT) 09/18/2016 30  8 - 41 U/L Final   . BILIRUBIN TOTAL 09/18/2016 0.3  0.3 - 1.3 mg/dL Final   . BILIRUBIN DIRECT 09/18/2016 0.1  <0.3 mg/dL Final   . PROTEIN TOTAL 09/18/2016 8.1  6.4 - 8.3 g/dL Final   Appointment on 09/02/2016   Component Date Value Ref Range Status   . HIV SCREEN, COMBINED ANTIGEN & ANT* 09/02/2016 Negative  Negative Final    Call the laboratory for assistance in cases with discordance between screen & confirmation    Note: for patients <2 yrs of age, it is recommended that an "HIV-1 Proviral DNA by PCR" be ordered to rule out the presence of HIV1/2 antigen or antibody from maternal origin.   Office Visit on 09/02/2016   Component Date Value Ref Range Status   . CHLAMYDIA TRACHOMATIS PCR 09/02/2016  Not Detected  Not Detected Final   . NEISSERIA GONORRHEA PCR 09/02/2016 Not Detected  Not Detected Final   . HPV16 PCR 09/02/2016 Negative  Negative Final   . HPV18 PCR 09/02/2016 Negative  Negative Final   . HPV OTHER 09/02/2016 Positive* Negative Final   Appointment on 08/23/2016   Component Date Value Ref Range Status   . ERYTHROCYTE SEDIMENTATION RATE (ES* 08/23/2016 14  0 - 20 mm/hr Final   . CRP INFLAMMATION 08/23/2016 3.0  <=8.0 mg/L Final   . ALBUMIN 08/23/2016 3.8  3.5 - 5.0 g/dL Final   . ALKALINE PHOSPHATASE 08/23/2016 97  <150 U/L Final   . ALT (SGPT) 08/23/2016 81* <55 U/L Final   . AST (SGOT) 08/23/2016 43* 8 - 41 U/L Final   . BILIRUBIN TOTAL 08/23/2016 0.5  0.3 - 1.3 mg/dL Final   . BILIRUBIN DIRECT 08/23/2016 0.2  <0.3 mg/dL Final   . PROTEIN TOTAL 08/23/2016 7.8  6.4 - 8.3 g/dL Final   . CREATININE 16/10/9602 0.80  0.49 - 1.10 mg/dL Final   . ESTIMATED GFR 08/23/2016 >59  >59 mL/min/1.83m^2 Final   . VITAMIN D, 25OH 08/23/2016 29* 30 - 100 ng/mL Final      Per Endocrine Society:  Deficient if <= 20 ng/mL  Insufficient if 21-29 ng/mL  Sufficient if >=30 ng/mL    Total 25-OH Vitamin D testing was performed on a [employer] BioPlex analyzer using FDA-approved protocols and multiplexed immunossay.  Vitamin D toxicity is rare and supplementation is generally considered safe; therefore, routine measurement of 25-OH vitamin D is generally not indicated and is not supported as best practice.     Assessment/Plan:   1. Psoriasis  2. Long term current use of immunosuppressive drug    3. 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  Imaging has not categorically shown sacroiliitis but she tends to do well with immunosuppression.  Still has some tenderness in the left SI joint.  Previous MRI has been negative for sacroiliitis.    Plan Today:   - Pt is has been taking Stelara for one month now. She is continuing  to have some back pain and morning stiffness, but overall she is feeling improved. She has been using ibuprofen prn and stretching for pain management.   - Pt advised to stop taking ibuprofen and will try Meloxicam for relief .   Continue stretches.  - Will obtain baseline liver function testing today before starting the Meloxicam.       RTC in 3-4 months, or sooner if needed.  Orders Placed This Encounter   . ALBUMIN   . ALT (SGPT)   . AST (SGOT)   . CREATININE   . meloxicam (MOBIC) 15 mg Oral Tablet   I am scribing for, and in the presence of, Hardie ShackletonHajra Elonna Mcfarlane, MD, for services provided on 06/11/2017.  Roderic PalauJacob Boice, Scribe    Sudie Grumbling//Aliveah Gallant Z. Sherryll BurgerShah, MD  06/11/2017 4:13 PM  Assistant Professor  Department of Medicine, Section of Rheumatology  [facility] [employer]    I personally performed the services described in this documentation, as scribed  in my presence, and it is both accurate  and complete.    Consuello BossierHajra Zehra Cyncere Sontag, MD

## 2017-06-26 ENCOUNTER — Other Ambulatory Visit (INDEPENDENT_AMBULATORY_CARE_PROVIDER_SITE_OTHER): Payer: Self-pay | Admitting: Pharmacist

## 2017-06-26 NOTE — Telephone Encounter (Signed)
Specialty Pharmacy Inflammatory Pharmacist Assessment    Treatment Regimen:  Stelara 45mg  SYRINGE - 1 syringe SubQ Q12Weeks  Diagnosis:  Psoriasis  Treatment Start Date:  Per patient, has been on Stelara for around 3-4 years (2015-2016); however, restarted therapy 04/22/17.    Contacted patient re: Stelara follow-up (06/26/17 @ 1:34PM).  Patient reported she has been on Stelara for around 3-4 years and previously had injections completed in clinic.  Noted last fill was picked-up by patient on 03/28/17; however, delayed restarting medication as she was waiting to have injection completed in clinic.  Thus, patient restarted therapy on 04/22/17 with injection completed in clinic.  Patient stated she may complete injections herself in the future, but for now plans to keep returning to clinic or may have nurses at her work complete injections.  Next injection planned for 07/15/17; patient requested [facility] contact her once medication is ready and she will pick-up and take to clinic for injection administration.  Thus, set future fill date for 07/14/17; advised patient we will contact her on that date to remind her to pick-up on 07/15/17.    Patient reported doing well with Stelara previously; reported her psoriasis was previously clear with therapy.  However, mentioned since restart it's taking a bit of time for medication to start working.  Patient reported no current side effects or missed doses.  Patient utilizing calendar on cellphone to assist with adherence with last injection on 04/22/17; patient plans to complete injections every 12th Tuesday.  Patient has no syringes remaining.  Patient reported some improvement in psoriasis since restart of therapy; patient mentioned her psoriasis plaques on her legs, scalp, and ears have started to fade.  However, patient continues to experience itching.  Patient also mentioned psoriasis sometimes keeps her from wearing shorts.  Patient rated current symptoms as a 4-5 on a scale of 1-10,  with 1 being mild and 10 being severe.    Obtained updated medication list.  Patient currently taking:  Drisdol, ibuprofen or meloxicam PRN (advised not to take together), lidoderm patches PRN, and multivitamin.  No significant drug-drug interactions with Stelara expected.  Offered to provide supplies (i.e., sharps container, alcohol swabs, band-aids) with refill; patient declined as she has some supplies at home and will dispose of used syringes in clinic.  Patient had no questions at this time.  Advised patient to contact [facility] [phone]) with future questions or concerns.  Patient expressed appreciation of follow-up.    Anell BarrJeanne Landan Fedie, PharmD, BCPS 06/26/2017, 15:33

## 2017-07-14 MED FILL — Stelara 45 Mg/0.5ml Pfs: 84 days supply | Qty: 1 | Fill #1 | Status: CP

## 2017-07-22 NOTE — Telephone Encounter (Signed)
Noted patient was to pick up Stelara refill on 07/15/17; however, did not do so and missed planned injection. Patient plans to pick-up refill tomorrow (07/23/17).    Anell BarrJeanne Mirra Basilio, PharmD, BCPS 07/22/2017, 11:09

## 2017-09-10 ENCOUNTER — Encounter (HOSPITAL_BASED_OUTPATIENT_CLINIC_OR_DEPARTMENT_OTHER): Payer: Self-pay | Admitting: Rheumatology

## 2017-09-22 ENCOUNTER — Encounter (HOSPITAL_BASED_OUTPATIENT_CLINIC_OR_DEPARTMENT_OTHER): Payer: Self-pay | Admitting: Rheumatology

## 2017-10-09 ENCOUNTER — Other Ambulatory Visit (INDEPENDENT_AMBULATORY_CARE_PROVIDER_SITE_OTHER): Payer: Self-pay | Admitting: Pharmacist

## 2017-10-09 NOTE — Telephone Encounter (Signed)
Attempted to contact patient re: Stelara follow-up (10/09/17 @ 12:00PM); left voicemail.    Anell Barr, PharmD, BCPS 10/09/2017, 12:02

## 2017-10-14 MED FILL — Stelara 45 Mg/0.5ml Pfs: 84 days supply | Qty: 1 | Fill #2 | Status: CP

## 2017-10-14 NOTE — Telephone Encounter (Signed)
Second attempt to contact patient re: Stelara follow-up (10/14/17 @ 12:46PM); left voicemail.    Anell Barr, PharmD, BCPS 10/14/2017, 12:48

## 2017-10-16 ENCOUNTER — Ambulatory Visit: Payer: BLUE CROSS/BLUE SHIELD | Attending: Rheumatology | Admitting: Rheumatology

## 2017-10-16 ENCOUNTER — Encounter (HOSPITAL_BASED_OUTPATIENT_CLINIC_OR_DEPARTMENT_OTHER): Payer: Self-pay | Admitting: Rheumatology

## 2017-10-16 VITALS — BP 120/68 | HR 83 | Temp 97.6°F | Wt 131.2 lb

## 2017-10-16 DIAGNOSIS — G5601 Carpal tunnel syndrome, right upper limb: Secondary | ICD-10-CM | POA: Insufficient documentation

## 2017-10-16 DIAGNOSIS — M1990 Unspecified osteoarthritis, unspecified site: Secondary | ICD-10-CM | POA: Insufficient documentation

## 2017-10-16 DIAGNOSIS — M25531 Pain in right wrist: Secondary | ICD-10-CM

## 2017-10-16 DIAGNOSIS — R079 Chest pain, unspecified: Secondary | ICD-10-CM | POA: Insufficient documentation

## 2017-10-16 DIAGNOSIS — Z87891 Personal history of nicotine dependence: Secondary | ICD-10-CM | POA: Insufficient documentation

## 2017-10-16 DIAGNOSIS — Z79899 Other long term (current) drug therapy: Secondary | ICD-10-CM

## 2017-10-16 DIAGNOSIS — L400 Psoriasis vulgaris: Secondary | ICD-10-CM | POA: Insufficient documentation

## 2017-10-16 DIAGNOSIS — M5416 Radiculopathy, lumbar region: Secondary | ICD-10-CM | POA: Insufficient documentation

## 2017-10-16 DIAGNOSIS — L409 Psoriasis, unspecified: Secondary | ICD-10-CM

## 2017-10-16 NOTE — Telephone Encounter (Signed)
Specialty Pharmacy Inflammatory Pharmacist Assessment    Treatment Regimen:  Stelara 45mg  SYRINGE - 1 syringe SubQ Q12Weeks  Diagnosis:  Psoriasis  Treatment Start Date:  Per patient, has been on Stelara for around 3-4 years (2015-2016); however, restarted therapy 04/22/17.    Spoke with patient re: Stelara follow-up (10/16/17 @ 3:38PM).  Refill of medication picked up by patient today (10/16/17); $5 copay with copay card (new insurance).  Patient reported doing well with Stelara.  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  Patient wishes to stay in education program, despite nurse administration.  Patient experiencing slight nausea post-injection; however, stated this is tolerable and mentioned she hasn't vomited.  Offered to send provider a message on her behalf; however, patient declined.  Advised patient if nausea worsens or is severe to contact provider.  Patient mentioned her last injection was around 1 week late as she had to find a nurse to administer injection.  Patient utilizing calendar to assist with adherence with last injection around 07/30/17.  Thus, advised patient next injection will be due around 10/22/17.  Patient reported benefit with Stelara; mentioned less itching of plaques.  However, patient experiencing psoriasis behind ears, on lower legs, elbows and mentioned her scalp/forehead is itchy.  Patient also mentioned experiencing more joint pain, tightness, and swelling prior to next planned injection.  Patient rated current symptoms as a 3-4 on a scale of 1-10, with 1 being mild and 10 being severe.    Obtained updated medication list.  Patient currently taking:  ibuprofen, naproxen, multivitamin, vitamin D.  No significant drug-drug interactions with Stelara expected.  Advised patient that ibuprofen and naproxen are in the same class; thus, do not want to take them the same day.  Patient voiced  understanding and stated she only takes naproxen PRN during period and ibuprofen when she is out of naproxen.  Addressed patient questions re: flu vaccine.  Patient stated obtained her flu vaccine on Friday, 10/10/17, and asked if she can still complete Stelara injection as planned.  Discussed immunosuppression with Stelara and need to avoid live vaccines while on biologic; advised patient the flu vaccine provided by employee health is inactivated, ok to proceed with injections as planned.  Addressed patient questions re: potential side effects of Stelara.  Patient had no additional questions at this time.  Advised patient to contact [facility] [phone]) with future questions or concerns.  Patient expressed appreciation of follow-up.    Anell Barr, PharmD, BCPS, CSP 10/16/2017, 16:54

## 2017-10-16 NOTE — Progress Notes (Signed)
RHEUMATOLOGY CLINIC  SECTION OF RHEUMATOLOGY  DEPARTMENT OF MEDICINE   MEDICINE      PATIENT NAME:  Joyce Hunt  DATE OF BIRTH:  11-Aug-1980  MRN:  [id number]  DATE OF SERVICE:  10/16/2017    HISTORY OF PRESENT ILLNESS:   Tyesha Joffe is a 37 y.o. 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 She previously followed with Dr. Manuella Ghazi, last visit was in June 2019. She is on Stelara (begun in May 2019) at present and tolerating the medication well. She has not noted swelling, erythema or pain of the joints. She states that she is due for her next injection of Stelara in the near future. She has noted a few small plaques of psoriasis on the lower legs. She has previously taken Enbrel. She has not tries MTX or Humira. She follows with a dermatologist.  She endorses anxiety related to her mother-in-law undergoing bone Ca treatment. She reports substernal, pressure-like CP, which has occurred episodically for the past 2-3 months. Recently, she has episodes at night every other night. She notes 2 episodes of heartburn after eating spicy foods. She believes anxiety is a component. She states CP wakes her from sleep and is associated with dyspnea. Episodes last 5-10 minutes and are self-resolving. She does not require elevation of the head during sleep. She notes recent onset of snoring per husband. She dances vigorously and does not experience exertional pressure-like CP. She does, however, endorse sharp chest pains on either side of the sternum intermittently while dancing, which are reporducible. She states she becomes short of breath after climbing 2-3 flights of stairs. Of note, her father died of an MI at age 80. She also endorses right dorsal wrist pain, which is exacerbated by writing. She endorses episodic numbness/parathesias in the bilateral 4th and fifth digits. She does not endorse elbow  pain. She sometimes sleeps with arms extended. She also complains of sharp, needle-like pain at the cranial aspect of the gluteus maximus muscle. She states the posterior aspect of her proximal leg becomes numb. She denies falls or trouble with ambulation. No urinary/fecal incontinence, no saddle paresthesias. She also has pain at the anterior aspect of the lower leg, which is worse with stair and dancing. She uses prn ibuprofen for pain. She sometimes uses naproxen, but generally reserves this for menstral cramps. She completed pool therapy for back pain 1 year ago and is amenable to trying this for back pain. again.                (+) prior diffuse hair loss off of stelara, no recent hair loss. (+) dry eye, no associated changes in vision. Denies prior sausage digits or swelling of the phalanges. Denies joint pain/erythema/edema, skin rash, photosenitivity,  oral/genital ulcers, plural or cardiac effusion, DVT/PE, miscarriage, nail changes, erythema of eyes, dry mouth mouth, fevers/chills, weight loss, night sweats    REVIEW OF SYSTEMS:  Maliyah reports symptoms per HPI  All other systems reviewed in detail and are negative, except as noted.      KNOWN DRUG ALLERGIES:  Allergies   Allergen Reactions   . Mushrooms [Mushroom Flavor] Itching and Swelling     DAILY MEDICATIONS:  Current Outpatient Medications   Medication Sig   . cholecalciferol, vitamin D3, 1,000 unit Oral Tablet Take 2,000 Units by mouth Once a day   . meloxicam (MOBIC) 15 mg Oral Tablet Take 1 Tab (15 mg total)  by mouth Once per day as needed for Pain   . multivitamin Oral Tablet Take 1 Tab by mouth Once a day   . naproxen (NAPROSYN) 250 mg Oral Tablet Take 250 mg by mouth Twice daily with food   . ustekinumab (STELARA) 45 mg/0.5 mL Subcutaneous Syringe 45 mg by Subcutaneous route Every 3 months     PAST MEDICAL HISTORY:  Past Medical History:   Diagnosis Date   . Abdominal pain 10/22/2015    ED Visit > USG showed ovaries with small cysts bilaterally  only   . Abnormal Pap smear of cervix 2012 ?    [place]   ? reading   . Arthritis     Ruled out RA   Rheumatologist Dr. Manuella Ghazi 05/2016    . Dysfunctional uterine bleeding 10/30/2015   . Elevated liver enzymes 03/2016    Sees Dr. Manuella Ghazi Rheumatology    . Fall 11/2015    steps in home fall in [place] > X-Ray without fracture > MRI 12/2015 - Negative for fracture; saw slight abnormality iin left femoral neck/socket area    . Keratosis pilaris 06/04/2016   . Migraine with aura     Ocular Migraine reported 07/2016 > Motrin 400 mg po relief.    . Plaque psoriasis 2015    Stelara RX > stopped 03/2016  OTC Vitamin D3  Sees Dr. Edward Qualia PCP for care   . Severe dysmenorrhea 09/02/2016     PAST SURGICAL HISTORY:  Past Surgical History:   Procedure Laterality Date   . Hx colposcopy  2012 ?   Marland Kitchen Hx elective abortion  2009     SOCIAL HISTORY:  Social History     Occupational History   . 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     Comment: 9 [place]    . Occupation: International aid/development worker      Comment: Psych    Tobacco Use   . Smoking status: Former Smoker     Packs/day: 0.25     Years: 3.00     Pack years: 0.75     Last attempt to quit: 08/20/2006     Years since quitting: 11.1   . Smokeless tobacco: Never Used   Substance and Sexual Activity   . Alcohol use: Yes     Alcohol/week: 0.0 - 2.0 standard drinks   . Drug use: No   . Sexual activity: Yes     Partners: Male     Birth control/protection: Condom     Comment:  Married 06/2015     FAMILY HISTORY:  Family Medical History:     Problem Relation (Age of Onset)    Arthritis-rheumatoid Paternal Grandmother    Cirrhosis Father    Diabetes Mother, Maternal Aunt    Heart Disease Father (42)    Hypertension (High Blood Pressure) Mother    Liver Disease Father    Ovarian Cancer Maternal Grandmother (35)    Psoriasis Maternal Uncle    Stroke Maternal Grandfather        PHYSICAL EXAM:   Vitals:    10/16/17 1354   BP: 120/68   Pulse: 83   Temp: 36.4 C (97.6 F)   TempSrc: Thermal Scan      SpO2: 100%   Weight: 59.5 kg (131 lb 2.8 oz)     Constitutional:  No acute distress.  Appears well-developed and well-nourished.    Head:  Normocephalic.  Atraumatic.    Mouth/Throat:  Oropharynx is  clear and moist.    Eyes:  Pupils equal and round. Conjunctiva clear. EOMI.  No scleral icterus.    Neck:  Supple.  No tracheal deviation.  No thyromegaly.    Cardiovascular:  Normal rate.  Regular rhythm.  No murmur.  No rub or gallop.  Intact distal pulses.    Pulmonary:  No respiratory distress.  Breath sounds normal.  No wheezes or rales.    Abdominal:  No distention.  No tenderness.  No rebound or guarding.    Musculoskeletal:  Normal gait.  Able to move about exam room without assistance. Full preserved ROM without any tenderness or synovitis at all small and large joints in both upper and lower extremities. (+) mild pain on palpation of the extensor tendon of the 2nd right hand overlying the MCP. No appreciable edema or deformity.      Integumentary:  1 cm hyperpigmented plaque with fine scale on the lateral aspect of the skin overlying the right gastrocnemius muscle. 1.3 cm hyper-pigmented patch on on the lateral aspect of the left lower leg. 0.5 c,m hyperpigmented patch on the lateral aspect of the left ankle. Scattered post-inflammatory hypo-pigmentated patches of the lower legs where the pt indicated prior psoriasis plaques.    Lymphatic:  No cervical adenopathy.    Neurological:  Alert and oriented.  Cranial nerves grossly intact.  Preserved muscle tone.  Sensation grossly intact in all extremities.  Strength 5/5 in all extremities at proximal and distal muscle groups.      LABS/IMAGING REVIEWED:   No visits with results within 3 Month(s) from this visit.   Latest known visit with results is:   Appointment on 06/11/2017   Component Date Value Ref Range Status   . ALBUMIN 06/11/2017 4.0  3.5 - 5.0 g/dL Final   . ALT (SGPT) 06/11/2017 52  <55 U/L Final   . AST (SGOT) 06/11/2017 29  8 - 41 U/L Final   .  CREATININE 06/11/2017 0.94  0.49 - 1.10 mg/dL Final   . ESTIMATED GFR 06/11/2017 >59  >59 mL/min/1.44m2 Final     TODAY'S ORDERS:  Orders Placed This Encounter   . ALBUMIN   . ALK PHOS (ALKALINE PHOSPHATASE)   . ALT (SGPT)   . AST (SGOT)   . BUN   . CALCIUM   . CBC   . C-REACTIVE PROTEIN(CRP),INFLAMMATION   . CREATININE   . SEDIMENTATION RATE   . LIPID PANEL   . AMB CONSULT/REFERRAL PHYS THERAPY- EXTERNAL   . TRANSTHORACIC ECHOCARDIOGRAM - ADULT   . EXERCISE STRESS TEST  ([place] [facility]ATE[place] ONLY) (AMB ONLY)   . STRESS TEST - ADULT     ASSESSMENT & PLAN:   1. Psoriasis    2. Long term current use of immunosuppressive drug    3. Plaque psoriasis    4. Arthritis    5. Lumbar radiculopathy    6. Lumbar radiculopathy, right    7. Right wrist pain    8. Carpal tunnel syndrome of right wrist    9. 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 Cutaneous psoriasis also generally well-controlled on Stelara. She has one small active plaque of psoriasis on exam today. Other skin lesions of pt concern discussed as likely post-inflammatory hyper-pigmentation in healing process of psoriatic skin lesions. Pt tolerates the medication well. Will continue Stelara for psoriasis, concern of seronegative spondyloarthropy and complete routine lab monitoring. She uses limited  ibuprofen for related pain with adequate control.     Intermittent Chest pain and associated DOE:  No chest pain at this time Cannot rule out cardiac etiology of CP/dyspnea. No symptoms at present. No clinical signs of heart failure on exam today. The pt has family hx of early MI in father, is a former smoker and has increased CVD risk due to psoriasis. Pt to complete exercise stress test, TTE and follow up with her PCP regarding symptoms. Will check fasting lipid panel to assess CV risk. Had long discussion regarding these tests, concern for cardiac CP and need to see  PCP. Pt expressed understanding. Anxiety is a likely component of CP. This was discussed with the pt, and she was advised to monitor mood and consider counseling due to increased stress as care giver for mother-in-law.      Right lumbar radiculopathy: No alarm symptoms suggestive of spinal cord compromise. Strength, sensation and function of LEs intact. Pt reports improvement of symptoms with pool therapy, last session one year ago. Ordered pool therapy to address symptoms.        Carpal tunnel, right wrist: Strength, sensation and function intact. She was advised to wear wrist splints at night for carpal tunnel. ordered pool therapy to address. symptoms.      It was discussed during the visit that we will contact Dangela if there are any abnormal labs and/or if her treatment plan needs to be changed from what was discussed during her clinic visit.      Ladan was asked to follow up in clinic in 4 months; or sooner, as needed.      Nonnie Done, MD  PGY-1, Transitional Year Resident     I saw and examined the patient.  I reviewed the resident's note.  I agree with the findings and plan of care as documented in the resident's note.  Any exceptions/additions are edited/noted.    Theda Belfast, MD

## 2017-10-21 ENCOUNTER — Ambulatory Visit
Admission: RE | Admit: 2017-10-21 | Discharge: 2017-10-21 | Disposition: A | Discharge status: Home / Self Care | Payer: BLUE CROSS/BLUE SHIELD | Source: Ambulatory Visit | Attending: Rheumatology | Admitting: Rheumatology

## 2017-10-21 DIAGNOSIS — M1990 Unspecified osteoarthritis, unspecified site: Secondary | ICD-10-CM | POA: Insufficient documentation

## 2017-10-21 DIAGNOSIS — L409 Psoriasis, unspecified: Secondary | ICD-10-CM

## 2017-10-21 DIAGNOSIS — R079 Chest pain, unspecified: Secondary | ICD-10-CM | POA: Insufficient documentation

## 2017-10-29 ENCOUNTER — Other Ambulatory Visit (HOSPITAL_BASED_OUTPATIENT_CLINIC_OR_DEPARTMENT_OTHER): Payer: Self-pay | Admitting: Rheumatology

## 2017-10-29 DIAGNOSIS — R0602 Shortness of breath: Secondary | ICD-10-CM

## 2017-10-29 DIAGNOSIS — Z87898 Personal history of other specified conditions: Secondary | ICD-10-CM

## 2017-10-29 DIAGNOSIS — R079 Chest pain, unspecified: Secondary | ICD-10-CM

## 2017-10-30 ENCOUNTER — Telehealth (HOSPITAL_BASED_OUTPATIENT_CLINIC_OR_DEPARTMENT_OTHER): Payer: Self-pay | Admitting: Rheumatology

## 2017-10-30 ENCOUNTER — Ambulatory Visit: Payer: BLUE CROSS/BLUE SHIELD | Attending: Rheumatology

## 2017-10-30 DIAGNOSIS — Z79899 Other long term (current) drug therapy: Secondary | ICD-10-CM | POA: Insufficient documentation

## 2017-10-30 DIAGNOSIS — L400 Psoriasis vulgaris: Principal | ICD-10-CM | POA: Insufficient documentation

## 2017-10-30 DIAGNOSIS — M1990 Unspecified osteoarthritis, unspecified site: Secondary | ICD-10-CM | POA: Insufficient documentation

## 2017-10-30 DIAGNOSIS — L409 Psoriasis, unspecified: Secondary | ICD-10-CM

## 2017-10-30 LAB — LIPID PANEL
CHOL/HDL RATIO: 4.3
CHOLESTEROL: 179 mg/dL (ref <200)
HDL CHOL: 42 mg/dL — ABNORMAL LOW (ref >=49)
LDL CALC: 92 mg/dL (ref <=100)
NON-HDL: 137 mg/dL (ref <=190)
TRIGLYCERIDES: 227 mg/dL — ABNORMAL HIGH (ref <=150)
VLDL CALC: 45 mg/dL — ABNORMAL HIGH (ref <30)

## 2017-10-30 LAB — CBC
HCT: 38.4 % (ref 34.8–46.0)
HGB: 12.9 g/dL (ref 11.5–16.0)
MCH: 29.5 pg (ref 26.0–32.0)
MCHC: 33.6 g/dL (ref 31.0–35.5)
MCV: 87.7 fL (ref 78.0–100.0)
MPV: 10.2 fL (ref 8.7–12.5)
PLATELETS: 319 10*3/uL (ref 150–400)
RBC: 4.38 x10ˆ6/uL (ref 3.85–5.22)
RDW-CV: 11.9 % (ref 11.5–15.5)
WBC: 9 x10ˆ3/uL (ref 3.7–11.0)

## 2017-10-30 LAB — AST (SGOT): AST (SGOT): 33 U/L (ref 8–41)

## 2017-10-30 LAB — CALCIUM: CALCIUM: 9.8 mg/dL (ref 8.5–10.2)

## 2017-10-30 LAB — BUN: BUN: 12 mg/dL (ref 8–25)

## 2017-10-30 LAB — ALK PHOS (ALKALINE PHOSPHATASE): ALKALINE PHOSPHATASE: 90 U/L (ref 40–110)

## 2017-10-30 LAB — CREATININE WITH EGFR
CREATININE: 0.97 mg/dL (ref 0.49–1.10)
ESTIMATED GFR: >60 mL/min/1.73mˆ2 (ref >60)

## 2017-10-30 LAB — C-REACTIVE PROTEIN(CRP),INFLAMMATION: CRP INFLAMMATION: <1 mg/L (ref <=8.0)

## 2017-10-30 LAB — ALBUMIN: ALBUMIN: 3.9 g/dL (ref 3.5–5.0)

## 2017-10-30 LAB — ALT (SGPT): ALT (SGPT): 44 U/L (ref <55)

## 2017-10-30 LAB — SEDIMENTATION RATE: ERYTHROCYTE SEDIMENTATION RATE (ESR): 17 mm/h (ref 0–20)

## 2017-10-30 NOTE — Telephone Encounter (Signed)
Message   Received: Yesterday   Message Contents   Carmelina Peal, MD  Yetta Glassman, RN            Her stress test is clinically positive. Can you please call her to say I am putting in a referral for cardiology for further evaluation.      Tried to call patient. Unable to reach or LM. Will send MyChart.

## 2017-11-04 LAB — QUANTIFERON TB GOLD PLUS, BLOOD
MITOGEN MINUS NIL RESULT: >10 IU/mL
NIL RESULT: 0.02 IU/mL
QUANTIFERON, QUALITATIVE: NEGATIVE
TB1 AG MINUS NIL RESULT: 0.01 IU/mL
TB2 AG MINUS NIL RESULT: 0 IU/mL

## 2017-11-13 NOTE — Progress Notes (Deleted)
[facility] Internal Medicine and Pediatrics    OUTPATIENT PROGRESS NOTE    Subjective:   Joyce Hunt is a pleasant 37 y.o. female who is a regular patient of *** who comes to clinic today ***     Reviewed today (Per EMR): {Reviewed:14835}         Review of systems:   General: negative for fevers, chills, change in appetite, unexplained weight changes  HENT: negative for cold symptoms, congestion, sore throat  CV: negative for chest pain, edema, palpitations  Resp: negative for cough, wheezing, shortness of breath  GI: negative for abdominal pain, nausea, vomiting, diarrhea, constipation, change in bowel habits, blood in stool  GU: negative for dysuria, hematuria, ***  MSK: negative for joint pains, muscle pains, joint swelling  Neuro: negative for headaches, dizziness, balance problems, tingling, numbness, focal weakness  Psych: negative for depression, anxiety, sleep disturbance, SI    Problem List:     Patient Active Problem List   Diagnosis   . Plaque psoriasis   . Migraine with aura   . Arthritis   . Keratosis pilaris   . Elevated liver enzymes   . Severe dysmenorrhea       Medications:     Current Outpatient Medications   Medication Sig   . cholecalciferol, vitamin D3, 1,000 unit Oral Tablet Take 2,000 Units by mouth Once a day   . meloxicam (MOBIC) 15 mg Oral Tablet Take 1 Tab (15 mg total) by mouth Once per day as needed for Pain   . multivitamin Oral Tablet Take 1 Tab by mouth Once a day   . naproxen (NAPROSYN) 250 mg Oral Tablet Take 250 mg by mouth Twice daily with food   . ustekinumab (STELARA) 45 mg/0.5 mL Subcutaneous Syringe 45 mg by Subcutaneous route Every 3 months       Allergies:     Allergies   Allergen Reactions   . Mushrooms [Mushroom Flavor] Itching and Swelling       Family History:     Family Medical History:     Problem Relation (Age of Onset)    Arthritis-rheumatoid Paternal Grandmother    Cirrhosis Father    Diabetes Mother, Maternal Aunt    Heart Disease Father (50)    Hypertension  (High Blood Pressure) Mother    Liver Disease Father    Ovarian Cancer Maternal Grandmother (35)    Psoriasis Maternal Uncle    Stroke Maternal Grandfather              Social History:     Social History     Tobacco Use   . Smoking status: Former Smoker     Packs/day: 0.25     Years: 3.00     Pack years: 0.75     Last attempt to quit: 08/20/2006     Years since quitting: 11.2   . Smokeless tobacco: Never Used   Substance Use Topics   . Alcohol use: Yes     Alcohol/week: 0.0 - 2.0 standard drinks   . Drug use: No       Objective:   Vitals:  not currently breastfeeding.   There is no height or weight on file to calculate BMI.  General: NAD, non-toxic  HEENT: Pupils equal, sclera clear, TMs clear, throat normal  Neck: supple, non-tender, no cervical adenopathy, no thyromegaly  CV: regular rate and rhythm, no murmurs, no edema, distal pulses 2+ and equal, carotids without bruit  Lungs: normal respiratory effort, no use of accessory  muscles, clear to auscultation bilaterally, no adventitious sounds  Abdomen: normal bowel sounds, soft, non-tender, no hepatosplenomegaly, no CVA tenderness  Ext: no edema  Neuro: alert and oriented x3, no focal deficits, cranial nerves grossly intact, gait normal  Skin: no rash      Labs:   ***      Assessment & Plan:   There are no diagnoses linked to this encounter.                 RTC if symptoms persist or worsen.  The patient has been educated and verbalized understanding regarding the services provided during this visit.    Marshall Cork, MD  Internal Medicine and Pediatrics  Assistant Professor    [facility]  [address]  [place], [place] [address]  [phone]

## 2017-11-14 ENCOUNTER — Encounter (INDEPENDENT_AMBULATORY_CARE_PROVIDER_SITE_OTHER): Payer: BLUE CROSS/BLUE SHIELD | Admitting: Internal Medicine

## 2017-11-17 ENCOUNTER — Encounter (INDEPENDENT_AMBULATORY_CARE_PROVIDER_SITE_OTHER): Payer: Self-pay | Admitting: Internal Medicine

## 2017-11-17 ENCOUNTER — Ambulatory Visit (INDEPENDENT_AMBULATORY_CARE_PROVIDER_SITE_OTHER): Payer: BLUE CROSS/BLUE SHIELD | Admitting: Internal Medicine

## 2017-11-17 VITALS — BP 124/70 | HR 82 | Temp 98.4°F | Resp 20 | Ht 62.0 in | Wt 131.0 lb

## 2017-11-17 DIAGNOSIS — G4452 New daily persistent headache (NDPH): Secondary | ICD-10-CM

## 2017-11-17 DIAGNOSIS — R079 Chest pain, unspecified: Secondary | ICD-10-CM

## 2017-11-17 DIAGNOSIS — G4719 Other hypersomnia: Secondary | ICD-10-CM

## 2017-11-17 DIAGNOSIS — R42 Dizziness and giddiness: Principal | ICD-10-CM

## 2017-11-17 DIAGNOSIS — R0683 Snoring: Secondary | ICD-10-CM

## 2017-11-17 DIAGNOSIS — G4730 Sleep apnea, unspecified: Secondary | ICD-10-CM

## 2017-11-17 NOTE — Progress Notes (Signed)
[facility] Internal Medicine and Pediatrics    OUTPATIENT PROGRESS NOTE    Subjective:   Joyce Hunt is a pleasant 37 y.o. female who is a regular patient of mine (last seen 2 years ago) who comes to clinic today for follow-up testing.  Reports that for a 1 month, was waking up feeling short of breath, would be gasping for air. She also would experience chest pain/tightness at the same time. Initially thought might be anxiety: Does have a lot of stress currently, mother-in-law has bone cancer and they are taking care of her, husband is upset because mother is ill. Then she noticed sometimes would get chest pain with exertion, she had started exercising more because thought that would help her with stress, but would note burning in legs, cramping forcing her to stop and then chest pain would start. She also feels more short of breath with exertion She had mentioned symptoms to her rheumatologist, and stress test was ordered,  Had exercise stress test that was symptom positive with non-specific ST changes, t wave inversions.  Has echo and cardiology appt 11/14.   Has been getting migraines. 2-3 days per week. Still has bright lights, even  Has had migraines for about 10 years. The migraines are lasting longer, at least once per week to every other week. She deos sometimes see bright lights with her headaches. With her most recent headache was different, sudden onset stabbing pain that happened while lying down. Headache was severe and lasted about 5 hours, had visual auras the entire time.  When she dances gets more vertigo/dizziness during turns which is new.   Snoring, excessive daytime sleepiness. Has had husband tell her she wakes up gasping for air   Reviewed today (Per EMR): active problem list, medication list, allergies         Review of systems:   General: negative for fevers, chills  HENT: negative for cold symptoms, congestion, sore throat      Problem List:     Patient Active Problem List   Diagnosis     . Plaque psoriasis   . Migraine with aura   . Arthritis   . Keratosis pilaris   . Elevated liver enzymes   . Severe dysmenorrhea       Medications:     Current Outpatient Medications   Medication Sig   . cholecalciferol, vitamin D3, 1,000 unit Oral Tablet Take 2,000 Units by mouth Once a day   . IBUPROFEN ORAL Take by mouth   . meloxicam (MOBIC) 15 mg Oral Tablet Take 1 Tab (15 mg total) by mouth Once per day as needed for Pain (Patient not taking: Reported on 11/17/2017)   . multivitamin Oral Tablet Take 1 Tab by mouth Once a day   . naproxen (NAPROSYN) 250 mg Oral Tablet Take 250 mg by mouth Twice daily with food   . ustekinumab (STELARA) 45 mg/0.5 mL Subcutaneous Syringe 45 mg by Subcutaneous route Every 3 months       Allergies:     Allergies   Allergen Reactions   . Mushrooms [Mushroom Flavor] Itching and Swelling       Family History:     Family Medical History:     Problem Relation (Age of Onset)    Arthritis-rheumatoid Paternal Grandmother    Cirrhosis Father    Diabetes Mother, Maternal Aunt    Heart Disease Father (50)    Hypertension (High Blood Pressure) Mother    Liver Disease Father    Ovarian  Cancer Maternal Grandmother (35)    Psoriasis Maternal Uncle    Stroke Maternal Grandfather              Social History:     Social History     Tobacco Use   . Smoking status: Former Smoker     Packs/day: 0.25     Years: 3.00     Pack years: 0.75     Last attempt to quit: 08/20/2006     Years since quitting: 11.2   . Smokeless tobacco: Never Used   Substance Use Topics   . Alcohol use: Yes     Alcohol/week: 0.0 - 2.0 standard drinks   . Drug use: No       Objective:   Vitals:  Blood pressure 124/70, pulse 82, temperature 36.9 C (98.4 F), temperature source Thermal Scan, resp. rate 20, height 1.575 m (5\' 2" ), weight 59.4 kg (131 lb), last menstrual period 10/20/2017, SpO2 99 %, not currently breastfeeding.   Body mass index is 23.96 kg/m.  General: NAD, non-toxic  HEENT: Pupils equal, sclera clear, TMs clear,  throat normal  Neck: supple, non-tender  CV: regular rate and rhythm, no murmurs, no edema  Lungs: normal respiratory effort, no use of accessory muscles, clear to auscultation bilaterally, no adventitious sounds  Ext: no edema  Neuro: alert and oriented x3, no focal deficits, cranial nerves grossly intact, gait normal, reflexes normal  Skin: no rash          Assessment & Plan:   Caylee was seen today for follow up and migraine.  New headache symptoms: positional stabbing pain, vision symptoms and vertigo  Will obtain MRI brain  Will also order sleep study given snoring, witnessed apneic episodes  Diagnoses and all orders for this visit:    Vertigo  -     MRI BRAIN WO CONTRAST; Future    New daily persistent headache  -     MRI BRAIN WO CONTRAST; Future    Snoring  -     POLYSOMNOGRAPHY - SLEEP STUDY - UNATTENDED; Future    Sleep apnea, unspecified type  -     POLYSOMNOGRAPHY - SLEEP STUDY - UNATTENDED; Future    Excessive daytime sleepiness  -     POLYSOMNOGRAPHY - SLEEP STUDY - UNATTENDED; Future    Chest pain, unspecified type     echo and cardiology appt 11/14                RTC if symptoms persist or worsen.  The patient has been educated and verbalized understanding regarding the services provided during this visit.    Marshall Cork, MD  Internal Medicine and Pediatrics  Assistant Professor    [facility]  [address]  [place], [place] [address]  [phone]

## 2017-11-20 ENCOUNTER — Ambulatory Visit (HOSPITAL_BASED_OUTPATIENT_CLINIC_OR_DEPARTMENT_OTHER): Payer: BLUE CROSS/BLUE SHIELD | Admitting: Cardiovascular Disease

## 2017-11-20 ENCOUNTER — Ambulatory Visit
Admission: RE | Admit: 2017-11-20 | Discharge: 2017-11-20 | Disposition: A | Discharge status: Home / Self Care | Payer: BLUE CROSS/BLUE SHIELD | Source: Ambulatory Visit | Attending: Cardiovascular Disease | Admitting: Cardiovascular Disease

## 2017-11-20 ENCOUNTER — Encounter (HOSPITAL_COMMUNITY): Payer: Self-pay | Admitting: Cardiovascular Disease

## 2017-11-20 ENCOUNTER — Encounter (HOSPITAL_BASED_OUTPATIENT_CLINIC_OR_DEPARTMENT_OTHER): Payer: Self-pay | Admitting: Rheumatology

## 2017-11-20 DIAGNOSIS — I371 Nonrheumatic pulmonary valve insufficiency: Secondary | ICD-10-CM

## 2017-11-20 DIAGNOSIS — M1990 Unspecified osteoarthritis, unspecified site: Secondary | ICD-10-CM

## 2017-11-20 DIAGNOSIS — Z79899 Other long term (current) drug therapy: Secondary | ICD-10-CM

## 2017-11-20 DIAGNOSIS — L400 Psoriasis vulgaris: Secondary | ICD-10-CM

## 2017-11-20 DIAGNOSIS — R0602 Shortness of breath: Secondary | ICD-10-CM | POA: Insufficient documentation

## 2017-11-20 DIAGNOSIS — L409 Psoriasis, unspecified: Secondary | ICD-10-CM

## 2017-11-20 DIAGNOSIS — Z8249 Family history of ischemic heart disease and other diseases of the circulatory system: Secondary | ICD-10-CM | POA: Insufficient documentation

## 2017-11-20 DIAGNOSIS — Z87898 Personal history of other specified conditions: Secondary | ICD-10-CM

## 2017-11-20 DIAGNOSIS — R0789 Other chest pain: Secondary | ICD-10-CM | POA: Insufficient documentation

## 2017-11-20 DIAGNOSIS — Z8759 Personal history of other complications of pregnancy, childbirth and the puerperium: Secondary | ICD-10-CM | POA: Insufficient documentation

## 2017-11-20 NOTE — H&P (Signed)
Cardiology  Operated by [facility]  New Patient History and Physical       Date:   11/20/2017  Name: Joyce Hunt  Age: 37 y.o.    Chief Complaint: Chest Pain  (on exertion) and Shortness of Breath (on exertion)    History of Present Illness  Joyce Hunt 37 y.o. female with no significant PMH who presents to clinic today with complains of chest pain and shortness of breath. Patient describes typical anginal chest pain with moderate physical activity. She recently underwent an exercise stress test that showed equivocal results, with symptom limiting physical activity and non-specific ECG changes. Patient also describes shortness of breath as well as bilateral LE pain with moderate physical activity. She is concerned as this symptoms seem to progress and are limiting her overall activity. Denies orthopnea, PND. Has a history of premature CAD in father.     Past Medical History  Past Medical History:   Diagnosis Date   . Abdominal pain 10/22/2015    ED Visit > USG showed ovaries with small cysts bilaterally only   . Abnormal Pap smear of cervix 2012 ?    [place], [place]   ? reading   . Arthritis     Ruled out RA   Rheumatologist Dr. Sherryll Burger 05/2016    . Dysfunctional uterine bleeding 10/30/2015   . Elevated liver enzymes 03/2016    Sees Dr. Sherryll Burger Rheumatology    . Fall 11/2015    steps in home fall in [place] > X-Ray without fracture > MRI 12/2015 - Negative for fracture; saw slight abnormality iin left femoral neck/socket area    . Keratosis pilaris 06/04/2016   . Migraine with aura     Ocular Migraine reported 07/2016 > Motrin 400 mg po relief.    . Plaque psoriasis 2015    Stelara RX > stopped 03/2016  OTC Vitamin D3  Sees Dr. Flint Melter PCP for care   . Severe dysmenorrhea 09/02/2016        Past Surgical History:   Procedure Laterality Date   . HX COLPOSCOPY  2012 ?     [place], [place]  ? results    . HX ELECTIVE ABORTION  2009    Vaccum Aspiration without complications             Current Outpatient Medications   Medication  Sig   . cholecalciferol, vitamin D3, 1,000 unit Oral Tablet Take 2,000 Units by mouth Once a day   . IBUPROFEN ORAL Take by mouth   . meloxicam (MOBIC) 15 mg Oral Tablet Take 1 Tab (15 mg total) by mouth Once per day as needed for Pain (Patient not taking: Reported on 11/17/2017)   . multivitamin Oral Tablet Take 1 Tab by mouth Once a day   . naproxen (NAPROSYN) 250 mg Oral Tablet Take 250 mg by mouth Twice daily with food   . ustekinumab (STELARA) 45 mg/0.5 mL Subcutaneous Syringe 45 mg by Subcutaneous route Every 3 months     Allergies   Allergen Reactions   . Mushrooms [Mushroom Flavor] Itching and Swelling       Family History  Family Medical History:     Problem Relation (Age of Onset)    Arthritis-rheumatoid Paternal Grandmother    Cirrhosis Father    Diabetes Mother, Maternal Aunt    Heart Disease Father (50)    Hypertension (High Blood Pressure) Mother    Liver Disease Father    Ovarian Cancer Maternal Grandmother (35)    Psoriasis  Maternal Uncle    Stroke Maternal Grandfather        Social History  Social History     Tobacco Use   . Smoking status: Former Smoker     Packs/day: 0.25     Years: 3.00     Pack years: 0.75     Last attempt to quit: 08/20/2006     Years since quitting: 11.2   . Smokeless tobacco: Never Used   Substance Use Topics   . Alcohol use: Yes     Alcohol/week: 0.0 - 2.0 standard drinks      Review of Systems  ROS : (All positive findings are bolded and underlined)  General: fever, chills, fatigue, weight change, appetite change  HEENT:  headache, dizziness, change of vision, tinnitus, congestion, epistaxis  Respiratory:  SOB, pleuritic pain, DOE, cough, sputum, wheezing  CV:  Chest pain, chest pressure, palpatations, pedal edema  GI: nausea, vomiting, diarrhea, constipation, hematemisis, melena, BRBPR  GU:  dysuria, hematuria, flank pain, discharge, retention, polydipsia/polyuria  Musculoskeletal:  Deformities, swelling, weakness, bilateral LE pain   Neuro:  dizziness, seizures, numbness,  tingling, weakness, imbalance  Skin:  rashes, bruises, skin/hair/nail changes  Psych: Anxiety, depression, SI, HI    Examination:  BP 114/76 (Site: Right, Patient Position: Sitting)   Pulse 73   Resp 20   Ht 1.575 m (5\' 2" )   Wt 59.4 kg (130 lb 15.3 oz)   LMP 11/19/2017   SpO2 98%   BMI 23.95 kg/m     General: In no acute distress, well developed, well nourished  HEENT: Mucous membranes moist  Eyes: PERRL, Conjunctiva non-icteric  Neck: Soft, supple  CV: Regular rate and rhythm no murmurs  Pulm: Clear to auscultation bilaterally no wheezing  Abd: Soft, Nontender nondistended with normal bowel sounds  Extrem: No cyanosis or edema  Skin: Warm and Dry  Neuro: AAO x 3   - Strength 5/5 BUE and BLE   - Sensation intact to light touch and symmetric throughout    Assessment and Plan  1. Exertional shortness of breath    2. History of exertional chest pain      Typical anginal chest pain  - significant family history with premature CAD in father at age 35  - history of smoking  - TTE performed today - normal LV EF, no wall motion abnormalities, no valvular pathology  - given equivocal exercise stress test results, with symptom limiting physical activity and non-specific ECG changes, we believe an imaging stress test would be of limited additional benefit  - will order CTA coronaries for assessment of further CAD     Cletis Mediaatiana Busu, MD      I saw and examined the patient.  I reviewed the resident's note.  I agree with the findings and plan of care as documented in the resident's note.  Any exceptions/additions are edited/noted.    Earnie LarssonBrian Kazienko, MD

## 2017-11-21 NOTE — Telephone Encounter (Signed)
-----   Message from DacusvillePriya Jackowski sent at 11/20/2017 11:10 PM EST -----  Regarding: Test Results Question  Contact: [phone]  I have a question about the stress test. What was the test positive for? The echocardiogram was normal, but noone has said what the positive was from the stress test and I was just wondering what it was.     Dr. Lynnell JudeKazienko has scheduled a CTA Scan so we can see more of what's going on. Please let me know if anything else changes. Thank you so much for your patience and kind response.    -Joyce Hunt

## 2017-11-24 ENCOUNTER — Ambulatory Visit
Admission: RE | Admit: 2017-11-24 | Discharge: 2017-11-24 | Disposition: A | Discharge status: Home / Self Care | Payer: BLUE CROSS/BLUE SHIELD | Source: Ambulatory Visit | Attending: Internal Medicine | Admitting: Internal Medicine

## 2017-11-24 DIAGNOSIS — R9082 White matter disease, unspecified: Secondary | ICD-10-CM

## 2017-11-24 DIAGNOSIS — R42 Dizziness and giddiness: Principal | ICD-10-CM

## 2017-11-24 DIAGNOSIS — G4452 New daily persistent headache (NDPH): Secondary | ICD-10-CM

## 2017-11-27 ENCOUNTER — Telehealth (INDEPENDENT_AMBULATORY_CARE_PROVIDER_SITE_OTHER): Payer: Self-pay | Admitting: Internal Medicine

## 2017-11-27 NOTE — Telephone Encounter (Signed)
-----   Message from Flint MelterKristen Moore, MD sent at 11/27/2017 11:32 AM EST -----  One area of white matter change (which is seen in people with migraines )otherwise normal

## 2017-11-27 NOTE — Telephone Encounter (Signed)
Patient notified of MRI results. Verbalized understanding.  Carola RhineHeidi Gluvna, RN  11/27/2017, 11:42

## 2017-11-27 NOTE — Progress Notes (Signed)
One area of white matter change (which is seen in people with migraines )otherwise normal

## 2017-12-14 ENCOUNTER — Ambulatory Visit (INDEPENDENT_AMBULATORY_CARE_PROVIDER_SITE_OTHER): Payer: BLUE CROSS/BLUE SHIELD

## 2017-12-14 ENCOUNTER — Encounter (INDEPENDENT_AMBULATORY_CARE_PROVIDER_SITE_OTHER): Payer: Self-pay

## 2017-12-14 VITALS — BP 109/76 | HR 69 | Temp 97.9°F | Resp 16 | Ht 62.0 in | Wt 131.8 lb

## 2017-12-14 DIAGNOSIS — M62838 Other muscle spasm: Secondary | ICD-10-CM

## 2017-12-14 MED ORDER — KETOROLAC 15 MG/ML INJECTION SOLUTION
15.0000 mg | Freq: Once | INTRAMUSCULAR | 0 refills | Status: AC
Start: 2017-12-14 — End: 2017-12-14

## 2017-12-14 MED ORDER — CYCLOBENZAPRINE 5 MG TABLET
5.00 mg | ORAL_TABLET | Freq: Every evening | ORAL | 0 refills | Status: DC | PRN
Start: 2017-12-14 — End: 2019-01-04

## 2017-12-14 NOTE — Progress Notes (Signed)
History of Present Illness: Joyce Hunt is a 37 y.o. female who presents to the Urgent Care today with chief complaint of    Chief Complaint            Neck Pain pain started on friday.     Shoulder Pain          Pt reports to UC with c/o neck pain, onset of 3 days ago. Pt sts she was carrying her heavy backpack on Friday and noticed some mild pain after this. She sts she went to bed that night and woke up the next morning unable to move her neck. She relates the feeling to a pinched nerve. She went to work yesterday and used hand warmers on the area. She sts her coworker tried to rub her neck/shoulder and noticed a sharp pain in between her spine and R shoulder. She reports taking 800 mg of ibuprofen yesterday which did not relieve her. She notes the pain progressively worsened throughout the day yesterday. When home that night she placed a lidocaine patch on the area which did not help either. She then used a heated bag of rice on the are which didn't relieve her either. She notes last night when she laid down, she felt a jolt of pain throughout her back and neck and wasn't able to get out of bed. She reports decreased sleeping last night due to the pain and no position was able to relieve her. She associates R neck and R shoulder pain. She does not that compared to last night her pain is not as bad this morning.  Pt denies any fever, chills, change in vision, arm paresthesia or numbness, rhinorrhea, sinus congestion, dry cough, chest pain, abdominal pain, nausea, vomiting or SOB. She reports a Hx of migraines. 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 Pt sts she is currently on her menstrual period. Pt has NKDA. She reports a Hx of psoriasis. Pt is currently taking Stelara.     I reviewed and confirmed the patient's past medical history taken by the nurse or medical assistant with the addition of the following:    Past Medical History:    Past Medical History:   Diagnosis Date   . Abdominal pain  10/22/2015    ED Visit > USG showed ovaries with small cysts bilaterally only   . Abnormal Pap smear of cervix 2012 ?    [place], [place]   ? reading   . Arthritis     Ruled out RA   Rheumatologist Dr. Sherryll Burger 05/2016    . Dysfunctional uterine bleeding 10/30/2015   . Elevated liver enzymes 03/2016    Sees Dr. Sherryll Burger Rheumatology    . Fall 11/2015    steps in home fall in [place] > X-Ray without fracture > MRI 12/2015 - Negative for fracture; saw slight abnormality iin left femoral neck/socket area    . Keratosis pilaris 06/04/2016   . Migraine with aura     Ocular Migraine reported 07/2016 > Motrin 400 mg po relief.    . Plaque psoriasis 2015    Stelara RX > stopped 03/2016  OTC Vitamin D3  Sees Dr. Flint Melter PCP for care   . Severe dysmenorrhea 09/02/2016     Past Surgical History:    Past Surgical History:   Procedure Laterality Date   . HX COLPOSCOPY  2012 ?     [place], [place]  ? results    . HX ELECTIVE ABORTION  2009  Vaccum Aspiration without complications      Allergies:  Allergies   Allergen Reactions   . Mushrooms [Mushroom Flavor] Itching and Swelling     Medications:    Current Outpatient Medications   Medication Sig   . cholecalciferol, vitamin D3, 1,000 unit Oral Tablet Take 2,000 Units by mouth Once a day   . cyclobenzaprine (FLEXERIL) 5 mg Oral Tablet Take 1 Tab (5 mg total) by mouth Every night as needed for Muscle spasms   . ketorolac (TORADOL) 15 mg/mL Injection Solution 1 mL (15 mg total) by Intramuscular route One time for 1 dose   . lidocaine (LIDODERM) 5 % Adhesive Patch, Medicated by Transdermal route Once a day   . multivitamin Oral Tablet Take 1 Tab by mouth Once a day   . ustekinumab (STELARA) 45 mg/0.5 mL Subcutaneous Syringe 45 mg by Subcutaneous route Every 3 months     Social History:    Social History     Tobacco Use   . Smoking status: Former Smoker     Packs/day: 0.25     Years: 3.00     Pack years: 0.75     Last attempt to quit: 08/20/2006     Years since quitting: 11.3   . Smokeless tobacco:  Never Used   Substance Use Topics   . Alcohol use: Yes     Alcohol/week: 0.0 - 2.0 standard drinks   . Drug use: No     Family History: No significant family history.  Family Medical History:     Problem Relation (Age of Onset)    Arthritis-rheumatoid Paternal Grandmother    Cirrhosis Father    Diabetes Mother, Maternal Aunt    Heart Disease Father (50)    Hypertension (High Blood Pressure) Mother    Liver Disease Father    Ovarian Cancer Maternal Grandmother (35)    Psoriasis Maternal Uncle    Stroke Maternal Grandfather        Review of Systems:    General: no fever and no chills   Eyes: no change in vision   ENT: sneezing, no rhinorrhea, and no sinus congestion   Pulmonary: no SOB and no chest pain  Gastrointestinal: no abdominal pain, no nausea, and no vomiting   Musculoskeletal: R neck and R shoulder pain   Neurologic: no arm numbness or paresthesia   All other review of systems were negative    Physical Exam:  Vital signs:   Vitals:    12/14/17 1046   BP: 109/76   Pulse: 69   Resp: 16   Temp: 36.6 C (97.9 F)   TempSrc: Tympanic   SpO2: 99%   Weight: 59.8 kg (131 lb 13.4 oz)   Height: 1.575 m (5\' 2" )   BMI: 24.16     Body mass index is 24.11 kg/m. Facility age limit for growth percentiles is 20 years.  Patient's last menstrual period was 11/19/2017.    General: Normal appearance and no acute distress  Head: Normocephalic and atraumatic   Eyes: Normal lids/lashes, normal conjunctiva, PERRLA and EOMI  ENT: Normal EAC's, normal TM's, MMM, normal pharynx/tonsils and normal tongue/uvula  Neck: Supple  Pulmonary: Clear to auscultation bilaterally, no wheezes, no rales and no rhonchi  Cardiovascular: Regular rate/rhythm, normal S1/S2 and no murmur/rub/gallop, brisk cap refill and equal pulses bilateral upper extremities   Musculoskeletal: no midline TTP, TTP over the R trapezius and right paracervical muscles, normal strength and sesnation in upper extremities and lower extremities, normal reflexes, no edema of  the  upper extremities appreciated.   Skin: Warm/dry and no rash  Psychiatric: Appropriate affect and behavior and alert and oriented x 3  Neurologic: Alert and oriented x3, normal sensation     Data Reviewed:    Not applicable    Course: Condition at discharge: Good     Differential Diagnosis: Muscle Spasm vs Muscle Strain vs Torticollis     Assessment:   1. Muscle spasm      Plan:    Orders Placed This Encounter   . ketorolac (TORADOL) 15 mg/mL Injection Solution   . cyclobenzaprine (FLEXERIL) 5 mg Oral Tablet     Medications E-M: Ketorolac tromethamine (Toradol)  Route of Administration: IM  Site: Left Gluteus  NDC #: [id number]  LOT #: [id number]  Expiration date: 07/08/18  Manufacturer: [employer]en[employer] kabi  Clinic Supplied: Yes  Comments:: patient tolerated. monitored x 15 minutes     Pt given Toradol IM injection in clinic, tolerated well. Update 1157: pt sts her pain has relieve moderately since Toradol injection.   Discussed use of 800 mg every 8 hours for the next 1 week, along with gentle stretching and a heating pad. May alternate ibuprofen with tylenol for pain relief. Discussed worsening signs and sxs that would warrant immediate presentation to ED.   Educated on use of Flexeril.  Advised to use at night as it may make her drowsey  Educated on ER precautions to include fever, new neurologic symptoms, worsening pan despite medications  If sxs don't improve, follow up with PCP for further evaluation.   Pt given school/work excuse per request.     Go to Emergency Department immediately for further work up if any concerning symptoms.  Plan was discussed and patient verbalized understanding. If symptoms are worsening or not improving the patient should return to the Urgent Care for further evaluation.    I am scribing for, and in the presence of, Dr. Tacey Ruizppenlander for services provided on 12/14/2017.  [address] Maple [address]Macy Carder, SCRIBE     [place] Cab[place]acy Carder, SCRIBE 12/14/2017, 12:15     I personally performed the services described in this  documentation, as scribed  in my presence, and it is both accurate  and complete.    Denese KillingsKathryn Oppenlander, MD

## 2017-12-14 NOTE — Nursing Note (Signed)
12/14/17 1125   Nursing Procedures / Med Admin   Medications E-M Ketorolac tromethamine (Toradol)   Medication Dose 15mg /.735ml   Route of Administration IM   Time of Administration 1125   Site Left Gluteus   [facility] [id number]   LOT # [id number]   Expiration date 07/08/18   Manufacturer [employer] kabi   Clinic Supplied Yes   Comments: patient tolerated. monitored x 15 minutes   Initials bd

## 2017-12-14 NOTE — Patient Instructions (Signed)
[facility]      Operated by [facility]  [address]  [place], [place] [address]  Phone: [phone]  Fax: [phone]  [email or web address]  Open Daily 8:00a - 8:00p    Closed Thanksgiving and Christmas Day  [facility]     [facility]ated by [facility]  [address]  [facility]  [place], [place] [address]  Phone: [phone]  Fax: [phone]  [email or web address]  Open M-F  8:00a - 8:00p  Sat              10:00a - 4:00p  Sun             Closed  Closed all  Holidays        Attending Caregiver: Denese KillingsKathryn Oppenlander, MD      Today's orders:   Orders Placed This Encounter   . ketorolac (TORADOL) 15 mg/mL Injection Solution   . cyclobenzaprine (FLEXERIL) 5 mg Oral Tablet        Prescription(s) E-Rx to:  [id number] [place], [place] - [address]    ________________________________________________________________________  Short Term Disability and Family Medical Leave Act  [facility] does NOT provide assistance with any disability applications.  If you feel your medical condition requires you to be on disability, you will need to follow up with  your primary care physician or a specialist.  We apologize for any inconvenience.    For Medication Prescribed by [facility]:  As an Urgent Care facility, our clinic does NOT offer prescription refills over the telephone.    If you need more of the medication one of our medical providers prescribed, you will  either need to be re-evaluated by us or see your primary care physician.    ________________________________________________________________________      It is very important that we have a phone number.  This is the single best way to contact you in the event that we become aware of important clinical information or concerns after your discharge.  If the phone number you provided at registration is NOT this number you should  inform staff and registration prior to leaving.      Your treatment and evaluation today was focused on identifying and treating potentially emergent conditions based on your presenting signs, symptoms, and history.  The resulting initial clinical impression and treatment plan is not intended to be definitive or a substitute for a full physical examination and evaluation by your primary care provider.  If your symptoms persist, worsen, or you develop any new or concerning symptoms, you need to be evaluated.      If you received x-rays during your visit, be aware that the final and formal interpretation of those films by a radiologist may occur after your discharge.  If there is a significant discrepancy identified after your discharge, we will contact you at the telephone number provided at registration.      If you received a pelvic exam, you may have cultures pending for sexually transmitted diseases.  Positive cultures are reported to the [facility] Department of Health as required by state law.  You may contact the Health Information Management Office of [facility] to get a copy of your results.     If you are over 35 year old, we cannot discuss your personal health information with a parent, spouse, family member, or anyone else without your consent.  This does not include those who have legitimate  access to your records and information to assist in your care under the provisions of HIPAA [facility] Portability and Accountability Act) law, or those to whom you have previously given written consent to do so, such a legal guardian or Power of [place].      Instructions are discussed with patient upon discharge by clinical staff with all questions answered.  Please call [facility] [phone]) if any further questions develop.  Go immediately to the emergency department if any concerns or worsening symptoms.      Denese Killings, MD 12/14/2017, 11:57

## 2017-12-15 ENCOUNTER — Ambulatory Visit
Admission: RE | Admit: 2017-12-15 | Discharge: 2017-12-15 | Disposition: A | Discharge status: Home / Self Care | Payer: BLUE CROSS/BLUE SHIELD | Source: Ambulatory Visit | Attending: Cardiovascular Disease | Admitting: Cardiovascular Disease

## 2017-12-15 DIAGNOSIS — Z87898 Personal history of other specified conditions: Secondary | ICD-10-CM | POA: Insufficient documentation

## 2017-12-15 DIAGNOSIS — I2510 Atherosclerotic heart disease of native coronary artery without angina pectoris: Secondary | ICD-10-CM

## 2017-12-15 MED ORDER — IOVERSOL 350 MG IODINE/ML INTRAVENOUS SOLUTION
65.00 mL | INTRAVENOUS | Status: AC
Start: 2017-12-15 — End: 2017-12-15
  Administered 2017-12-15: 65 mL via INTRAVENOUS

## 2017-12-15 NOTE — Nurses Notes (Signed)
Pts CCTA scan completed, pt tolerated well. Vital signs and heart rate stable throughout scan. IV d/c'd angio intact. Pt received no medications. HR at scan was 55.56. At discharge VSS, Pt denies any dizziness or vertigo. Pt encouraged to increase fluid intake after scan. Refer to VS flow sheet for VS. Pt stable for discharge at this time.

## 2017-12-18 ENCOUNTER — Encounter (HOSPITAL_COMMUNITY): Payer: Self-pay | Admitting: Cardiovascular Disease

## 2017-12-18 DIAGNOSIS — Q245 Malformation of coronary vessels: Secondary | ICD-10-CM

## 2017-12-18 NOTE — Progress Notes (Signed)
Patient called with further explanation of the results of her coronary CTA.  The patient expressed that she does continue to have dyspnea on exertion and intermittent chest pressure with exertion.  The patient was reassured that she does not have significant coronary artery disease as the cause for the symptoms.  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  I did discuss the findings with Dr. Lynnell JudeKazienko.  Findings in symptoms associated were discussed with the patient in great detail.  Referral was placed for outpatient cardiac surgery evaluation for Dr. Adriana Simasook.  The patient was encouraged to notify us with any further concerns.    If she has any issues in the meantime, she will notify us at the [facility] or proceed to the nearest ER for evaluation.    Kermit Baloavid S. Rankin, MSN, APRN, FNP-C  Cardiology Nurse Practitioner  [facility]

## 2017-12-18 NOTE — Telephone Encounter (Signed)
-----   Message from Renard HamperKathryn B Zweifel, RN sent at 12/18/2017  1:18 PM EST -----  Regarding: FW: Test Results Question  Contact: [phone]    ----- Message -----  From: Carylon PerchesPriya Shadwick  Sent: 12/18/2017   1:18 PM EST  To: Card Nurses  Subject: Test Results Question                            Good Afternoon Dr. Lynnell JudeKazienko:    Just wanted to see if you can explain a bit more about my results from the CT scan of the heart and chest. It looks like it said everything came back normal? Please confirm. Thank you so much!    -[facility] [place]

## 2017-12-19 ENCOUNTER — Telehealth (HOSPITAL_COMMUNITY): Payer: Self-pay | Admitting: THORACIC SURGERY CARDIOTHORACIC VASCULAR SURGERY

## 2017-12-19 NOTE — Telephone Encounter (Signed)
Called patient and significant other to schedule referral appointment. No answer on either line.     Joyce Hunt  12/19/2017, 09:21

## 2017-12-22 ENCOUNTER — Telehealth (HOSPITAL_COMMUNITY): Payer: Self-pay | Admitting: THORACIC SURGERY CARDIOTHORACIC VASCULAR SURGERY

## 2017-12-22 NOTE — Telephone Encounter (Signed)
Called to schedule consultation appointment. Left voice mail on both numbers listed.     Joyce Hunt  12/22/2017, 15:20

## 2017-12-23 NOTE — H&P (Signed)
CARDIAC SURGERY DEPARTMENTH&P     Date of Service:  12/25/2017  Joyce Hunt, Joyce Hunt    Chief Complaint: Heart Eval ( Evaluation for symptomatic myocardial bridging of LAD)      Requesting Physician: Clenton Pareankin, David Samuel, A*  PCP: Flint MelterKristen Moore, MD  Primary Cardiologist: Dr. 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  She has a past medical history of psoriatic arthritis currently on Stelara following with rheumatology, former smoker who quit in 2008 and premature CAD in her father.  She established care recently with cardiology for exertional chest pain and dyspnea.  She had a stress test on 10/21/17 that was clinically positive, electrocardiographically equivocal exercise ECG study.  She then had a TTE on 11/20/17 that revealed EF of 60%, no regional wall abnormalities, and no significant valvular disease.  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  As for her symptoms, she reports with exertion, first her legs feel very tight then as the activity increases, she becomes short of breath and has mid sternal chest pain.  She reports her symptoms started a few months ago.  Rest can alleviate symptoms.  She also notes intermittent dizziness and mild leg edema.  She denies any syncope, orthopnea or abdominal fullness.     ROS: Other than ROS in the HPI, all other review of systems were negative except for: Musculoskeletal: positive for arthralgias  Neurological: positive for headaches      Information Obtained from patient    Past Medical History:   Diagnosis Date   . Abdominal pain 10/22/2015    ED Visit > USG showed ovaries with small cysts bilaterally only   . Abnormal Pap smear of cervix 2012 ?    [place]   ? reading   . Arthritis     Ruled out RA   Rheumatologist  Dr. Sherryll BurgerShah 05/2016    . Dysfunctional uterine bleeding 10/30/2015   . Elevated liver enzymes 03/2016    Sees Dr. Sherryll BurgerShah Rheumatology    . Fall 11/2015    [place] > X-Ray without fracture > MRI 12/2015 - Negative for fracture; saw slight abnormality iin left femoral neck/socket area    . Keratosis pilaris 06/04/2016   . Migraine with aura     Ocular Migraine reported 07/2016 > Motrin 400 mg po relief.    . Plaque psoriasis 2015    Stelara RX > stopped 03/2016  OTC Vitamin D3  Sees Dr. Flint MelterKristen Moore PCP for care   . Severe dysmenorrhea 09/02/2016           Risk Factors:    Family history of premature CAD (female < 2655 or female < 65): Yes. Relation to patient: father  Diabetes Mellitus  (HbA1C >6.5, Fasting >126 or Random glucose >200 with hyperglycemic symptoms):  No    Dyslipidemia:  (total chol >200, LDL? 130, HDL men <40, HDL women <50): No    Renal Disease: No  Hypertension (140/90 or 130/80 with DM or CKD): No  Chronic Lung Disease: No.   Home oxygen:No  Sleep Apnea: No   Pneumonia: No  History of or current Depression: No  Liver disease:  No  Immunocompromised at present (systemic steroids, chemo, anti-rejection meds): No  Mediastinal radiation: No  Cancer within 5 years (doesn't include basal cell or squamous cell CA): No  Peripheral artery disease (claudication, amputation, vascular reconstruction, aortic aneurysm. THIS DOES NOT include the carotid or cerebral vascular arteries or thoracic aneurysms): No    Thoracic Aorta Disease (history or current disease of the thoracic or thorcoabdominal aorta): No    Syncope (cardiac related within 1 year or surgery): No  Prior Cerebrovascular disease: No  History of previous carotid artery surgery and/or stenting: No  CSHA Clinical Frailty Scale: 2- Well: No active disease symptoms but less than category 1.   Five Meter Walk/ Six Minute Walk Performed: No.  Functional Disability: None  Electrolyte Imbalance: No  Protein-Calorie Malnutrition: No  Coagulopathy:  No  Current Sepsis: No  Contraindication for Perioperative Beta-Blocker: No    Cardiac Status:    Prior MI: No  CAD presentation: Stable angina (without change within 42 days)  Heart Failure: No  Cardiogenic Shock: No  Previous Arrhythmia : No  Prior Arrythmia surgery (MAZE or ablation): No  Prior CABG: No   Prior Valve Surgery: No  Prior PCI: No  Previous congenital: No  Previous ICD: No  Previous Pacemaker: No  Other previous cardiovascular intervention: No  Cardiomyopathy: No  Porcelain Aorta: No    Transcatheter Procedure: No    Allergies   Allergen Reactions   . Mushrooms [Mushroom Flavor] Itching and Swelling       Current  Medications:  Current Outpatient Medications   Medication Sig   . cholecalciferol, vitamin D3, 1,000 unit Oral Tablet Take 2,000 Units by mouth Once a day   . cyclobenzaprine (FLEXERIL) 5 mg Oral Tablet Take 1 Tab (5 mg total) by mouth Every night as needed for Muscle spasms   . lidocaine (LIDODERM) 5 % Adhesive Patch, Medicated by Transdermal route Once a day   . multivitamin Oral Tablet Take 1 Tab by mouth Once a day   . ustekinumab (STELARA) 45 mg/0.5 mL Subcutaneous Syringe 45 mg by Subcutaneous route Every 3 months       Past Surgical History:   Procedure Laterality Date   . HX COLPOSCOPY  2012 ?     [place], [place]  ? results    . HX ELECTIVE ABORTION  2009    Vaccum Aspiration without complications            Past Family History:   Family Medical History:     Problem Relation (Age of Onset)    Arthritis-rheumatoid Paternal Grandmother    Cirrhosis Father    Diabetes Mother, Maternal Aunt    Heart Attack Father    Heart Disease Father (50)    Hypertension (High Blood Pressure) Mother    Liver Disease Father    Ovarian Cancer Maternal Grandmother (35)    Psoriasis Maternal Uncle    Stroke Maternal Grandfather    Sudden Death no cause Father          Social History     Socioeconomic History   . Marital status: Married     Spouse name: Not on file   . Number of children: Not on file   .  Years of education: Not on file   . Highest education level: Not on file   Occupational History   . Occupation: Interior and spatial designer: [employer]     Comment: [address] [place]    . Occupation: Health and safety inspector      Comment: Psych    Tobacco  Use   . Smoking status: Former Smoker     Packs/day: 0.25     Years: 3.00     Pack years: 0.75     Last attempt to quit: 08/20/2006     Years since quitting: 11.3   . Smokeless tobacco: Never Used   Substance and Sexual Activity   . Alcohol use: Yes     Alcohol/week: 0.0 - 2.0 standard drinks   . Drug use: No   . Sexual activity: Yes     Partners: Male     Birth control/protection: Condom     Comment:  Married 06/2015   Other Topics Concern         Exam:   BP 127/81   Pulse 78   Temp 37 C (98.6 F) (Thermal Scan)   Ht 1.575 m (5\' 2" )   Wt 59.1 kg (130 lb 4.7 oz)   SpO2 95%   BMI 23.83 kg/m       Constitutional:  appears in good health  Eyes:  Conjunctiva clear., Sclera non-icteric.   ENT:  Ear canals normal.  Neck:  no thyromegaly or lymphadenopathy  Respiratory:  Clear to auscultation bilaterally.   Cardiovascular:  regular rate and rhythm, S1, S2 normal, no murmur, click, rub or gallop  Gastrointestinal:  Soft, non-tender, Bowel sounds normal, non-distended  Musculoskeletal:  Head atraumatic and normocephalic  Integumentary:  Skin warm and dry  Neurologic:  Grossly normal  Lymphatic/Immunologic/Hematologic:  No lymphadenopathy  Psychiatric:  Normal    Labs:  Lab Results   Component Value Date    SODIUM 136 10/22/2015    POTASSIUM 3.7 10/22/2015    CHLORIDE 104 10/22/2015    CO2 20 (L) 10/22/2015    ANIONGAP 12 10/22/2015    BUN 12 10/30/2017    CREATININE 0.97 10/30/2017    BUNCRRATIO 16 10/22/2015    GFR >60 10/30/2017    GLUCOSENF 98 10/22/2015    GLUCOSEFAST 83 12/29/2015       Lab Results   Component Value Date/Time    WBC 9.0 10/30/2017 02:36 PM    HGB 12.9 10/30/2017 02:36 PM    HCT 38.4 10/30/2017 02:36 PM    PLTCNT 319 10/30/2017 02:36 PM    RBC 4.38 10/30/2017 02:36  PM    MCV 87.7 10/30/2017 02:36 PM    MCHC 33.6 10/30/2017 02:36 PM    MCH 29.5 10/30/2017 02:36 PM    RDW 12.4 03/19/2016 02:26 PM    MPV 10.2 10/30/2017 02:36 PM         Radiology Tests:  Investigations/Diagnostic Findings:  Echo:  TTE 11/20/17  Conclusions:  1. Normal left ventricular size. Normal left ventricular ejection fraction. LV Ejection Fraction is 60 %.  Normal geometry.  2. Resting Segmental Wall Motion Analysis: Total wall motion score is 1.00. There are no regional wall  motion abnormalities.  3. Normal right ventricular size. Normal right ventricular systolic function.  4. The left atrium is normal in size.  5. The right atrium is normal in size.  6. There is no significant valvular heart disease.  Findings:  Left Ventricle: Normal left ventricular size. Normal left ventricular ejection fraction. LV Ejection Fraction is 60  %. Normal geometry.  Resting Segmental Wall Motion Analysis: Total wall motion score is 1.00. There are no regional wall motion  abnormalities.  Right Ventricle: Normal right ventricular size. Normal right ventricular systolic function.  Left Atrium: The left atrium is normal in size.  Right Atrium: The right atrium is  normal in size.  Mitral Valve: Normal mitral valve. No mitral regurgitation seen.  Aortic Valve: Normal aortic valve. Trileaflet aortic valve. No Aortic Valve Stenosis.  Tricuspid Valve: Normal tricuspid valve. No tricuspid regurgitation seen.  Pulmonic Valve: The pulmonic valve is not well visualized. There is trivial pulmonic regurgitation.  Pericardium: Not well visualized.  Aorta: Asc Ao Diam 2D 2.6 cm. Abdominal Aorta 1.4.  IVC: The inferior vena cava is of normal size.    Cath:  NONE    CXR: No results found for this or any previous visit (from the past 720 hour(s)).    EKG: No results found for this or any previous visit (from the past 720 hour(s)).      CTA HEART CORONARY:      INDICATION: History of exertional chest pain     PROTOCOL: Baseline heart  rhythm and vital signs were evaluated.  An   intravenous line was established and rapid bolus iodinated contrast was   administered using bolus triggering while CT images  were obtained. No complications were encountered.  Three-dimensional   reconstructions of the CT images were performed and post-processing and   analysis were done using Vitrea.       CCTA Scan Specifications:     Type of gating: Prospective ECG-triggered  Dose modulation:  Yes  kVP: 120 Radiation: DLP 358.1 mGy.cm   Contrast: Optiray 350    Contrast volume:  65 cc   Dye Allergy prep: No     Medications:   Beta blockers: 0 mg PO;  Beta blockers: 0 mg IV;   Sublingual NTG: 0.4 mg    OVERALL STUDY QUALITY: Good    CORONARY CALCIUM SCORE: Total Score: 0 AU  LM 0 AU, LAD 0 AU, LCx 0 AU, RCA 0 AU    CORONARY ANATOMY:   The left main artery is normal and bifurcates into the left anterior   descending and the circumflex artery.  The left anterior descending artery gives rise to two diagonal branches.   The first diagonal branch is a small size vessel. There is 23mm long and   up to 4mm deep myocardial bridging of  the mid left anterior descending artery segment. The left anterior   descending artery and the diagonal vessels are without disease.  The left circumflex artery gives rise to two obtuse marginal branches. The   circumflex and circumflex marginal vessels are without disease.  The right coronary artery is a large vessel. The proximal right coronary   artery has minimal (1-24%) stenosis caused by non-calcified plaque. The   right coronary artery, posterior descending and posterior lateral branches   are without disease.   Coronary Dominance: Right    CARDIAC FINDINGS:   Cardiac Dimensions:  Aortic root diameter: 2.7 cm  Ascending aorta diameter: 2.5 cm  Descending thoracic aorta diameter: 1.7 cm    Main pulmonary artery diameter: The pulmonary arteries are not completely   imaged, and the visualized portions are not optimally contrast  enhanced   for evaluation. 2.0 cm   There is no evidence of thoracic aortic atherosclerotic disease.    Gross Cardiac Anatomy:   Left Ventricle: Mildly dilated  Right Ventricle: Morphologically normal  Left Atrium: Morphologically normal  Left Atrial Appendage: No thrombus. `  Right Atrium: Morphologically normal  Interatrial Septum: Normal appearance of the septum  Interventricular Septum: Normal appearance of the septum  Aortic Valve: Normal CT appearance of valve without calcification.  Mitral Valve: Normal CT appearance of valve without  calcification.  Pericardium: Normal appearing pericardium without evidence of   calcification or effusion.    NON-CARDIAC FINDINGS:   Please refer to radiology report for mediastinum, lungs, upper abdomen,   bones, and chest wall findings.     IMPRESSION:  Impression:   #1: Non-obstructive coronary artery disease. There is 23mm long and up to   4mm deep myocardial bridging of the mid left anterior descending artery   segment.   #2: Coronary calcium score: 0 AU.    #3: Please refer to radiology report for mediastinum, lungs, upper   abdomen, bones, and chest wall findings.     CAD-RADS 1 - (1-24%) Minimal stenosis or plaque with no stenosis. Minimal   non-obstructive CAD.    Leodis Liverpool, MD, MD, 12/15/2017, .10:24           ASSESSMENT & PLAN:    Patient Active Problem List    Diagnosis Date Noted   . Severe dysmenorrhea 09/02/2016   . Keratosis pilaris 06/04/2016   . Elevated liver enzymes 03/07/2016   . Migraine with aura 10/30/2015   . Arthritis 10/30/2015   . 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  She has a past medical history of psoriatic arthritis currently on Stelara following with rheumatology, former smoker who quit in 2008 and premature CAD in her father.  She established care recently with cardiology for exertional chest pain and  dyspnea.  She had a stress test on 10/21/17 that was clinically positive, electrocardiographically equivocal exercise ECG study.  She then had a TTE on 11/20/17 that revealed EF of 60%, no regional wall abnormalities, and no significant valvular disease.  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  Her pertinent testing and records were reviewed by Dr. Su Hilt.      There is NO STS risk score for this procedure    Plan:    Dr. Su Hilt discussed with the patient her diagnosis, testing results and treatment options.  Dr. Su Hilt would like to further assess to confirm her diagnosis with a cardiac catheterization.  Once her cardiac catheterization is completed, Dr. Su Hilt would like to see her back in clinic to go over her results and plan.  Patient verbalized understanding and is agreeable to plan.        Idelle Leech, APRN    Pt seen.  Coronary CTA reviewed.  Her atypical chest pain may be related to myocardial bridging of the LAD.  Will reassess after cath obtained.    HGR, MD

## 2017-12-25 ENCOUNTER — Encounter (HOSPITAL_COMMUNITY): Payer: Self-pay | Admitting: THORACIC SURGERY CARDIOTHORACIC VASCULAR SURGERY

## 2017-12-25 ENCOUNTER — Ambulatory Visit
Payer: BLUE CROSS/BLUE SHIELD | Attending: Nurse Practitioner | Admitting: THORACIC SURGERY CARDIOTHORACIC VASCULAR SURGERY

## 2017-12-25 VITALS — BP 127/81 | HR 78 | Temp 98.6°F | Ht 62.0 in | Wt 130.3 lb

## 2017-12-25 DIAGNOSIS — Z8249 Family history of ischemic heart disease and other diseases of the circulatory system: Secondary | ICD-10-CM | POA: Insufficient documentation

## 2017-12-25 DIAGNOSIS — L4050 Arthropathic psoriasis, unspecified: Secondary | ICD-10-CM | POA: Insufficient documentation

## 2017-12-25 DIAGNOSIS — Z79899 Other long term (current) drug therapy: Secondary | ICD-10-CM | POA: Insufficient documentation

## 2017-12-25 DIAGNOSIS — Z87891 Personal history of nicotine dependence: Secondary | ICD-10-CM | POA: Insufficient documentation

## 2017-12-25 DIAGNOSIS — I2510 Atherosclerotic heart disease of native coronary artery without angina pectoris: Secondary | ICD-10-CM | POA: Insufficient documentation

## 2017-12-25 DIAGNOSIS — Z01818 Encounter for other preprocedural examination: Secondary | ICD-10-CM

## 2017-12-25 DIAGNOSIS — Q245 Malformation of coronary vessels: Secondary | ICD-10-CM | POA: Insufficient documentation

## 2017-12-25 LAB — CBC WITH DIFF
BASOPHIL #: <0.1 x10ˆ3/uL (ref <=0.20)
BASOPHIL %: 0 %
EOSINOPHIL #: <0.1 10*3/uL (ref <=0.50)
EOSINOPHIL #: <0.1 x10ˆ3/uL (ref <=0.50)
EOSINOPHIL %: 1 %
HCT: 40.4 % (ref 34.8–46.0)
HGB: 13.6 g/dL (ref 11.5–16.0)
IMMATURE GRANULOCYTE #: <0.1 x10ˆ3/uL (ref <0.10)
IMMATURE GRANULOCYTE %: 0 % (ref 0–1)
LYMPHOCYTE #: 3.52 x10ˆ3/uL (ref 1.00–4.80)
LYMPHOCYTE %: 38 %
MCH: 29.4 pg (ref 26.0–32.0)
MCHC: 33.7 g/dL (ref 31.0–35.5)
MCV: 87.4 fL (ref 78.0–100.0)
MONOCYTE #: 0.63 x10ˆ3/uL (ref 0.20–1.10)
MONOCYTE %: 7 %
MPV: 9.3 fL (ref 8.7–12.5)
NEUTROPHIL #: 4.88 x10ˆ3/uL (ref 1.50–7.70)
NEUTROPHIL %: 54 %
PLATELETS: 309 x10ˆ3/uL (ref 150–400)
RBC: 4.62 10*6/uL (ref 3.85–5.22)
RDW-CV: 11.9 % (ref 11.5–15.5)
WBC: 9.2 x10ˆ3/uL (ref 3.7–11.0)

## 2017-12-25 LAB — TYPE AND SCREEN
ABO/RH(D): O POS
ANTIBODY SCREEN: NEGATIVE

## 2017-12-25 LAB — BASIC METABOLIC PANEL
ANION GAP: 9 mmol/L (ref 4–13)
BUN/CREA RATIO: 14 (ref 6–22)
BUN: 12 mg/dL (ref 8–25)
CALCIUM: 9.9 mg/dL (ref 8.5–10.2)
CHLORIDE: 105 mmol/L (ref 96–111)
CO2 TOTAL: 24 mmol/L (ref 22–32)
CREATININE: 0.83 mg/dL (ref 0.49–1.10)
ESTIMATED GFR: >60 mL/min/1.73mˆ2 (ref >60)
GLUCOSE: 86 mg/dL (ref 65–139)
POTASSIUM: 4.5 mmol/L (ref 3.5–5.1)
SODIUM: 138 mmol/L (ref 136–145)

## 2017-12-25 LAB — PT/INR
INR: 1.08 (ref 0.80–1.20)
PROTHROMBIN TIME: 12.7 s (ref 9.5–14.1)

## 2017-12-25 NOTE — Patient Instructions (Signed)
Cardiac Surgery  [address], [address]  [place], [place] [address]  Phone / [phone] ([place])  Fax / [phone]      Medication Adjustments:  None.      Plan:  You have met with Dr. Mancel Bale to discuss your symptoms and he has reviewed your images.  There is concern for possible myocardial bridging.      Dr. Reino Kent and Dr. Mancel Bale have discussed your symptoms and images.  He would like for you to follow up with a cardiac catheretization.  After having this cath completed, he would like to see you back in clinic.    Dr. Natasha Mead team will call you tomorrow to discuss the heart cath.  Labs have been collected.

## 2017-12-26 ENCOUNTER — Encounter (HOSPITAL_COMMUNITY): Payer: Self-pay

## 2017-12-26 ENCOUNTER — Telehealth (HOSPITAL_COMMUNITY): Payer: Self-pay | Admitting: Interventional Cardiology

## 2017-12-26 ENCOUNTER — Other Ambulatory Visit (HOSPITAL_COMMUNITY): Payer: Self-pay | Admitting: Thoracic Surgery (Cardiothoracic Vascular Surgery)

## 2017-12-26 DIAGNOSIS — Z8679 Personal history of other diseases of the circulatory system: Secondary | ICD-10-CM | POA: Insufficient documentation

## 2017-12-26 DIAGNOSIS — Q245 Malformation of coronary vessels: Secondary | ICD-10-CM

## 2017-12-26 NOTE — Patient Instructions (Signed)
PATIENT INSTRUCTIONS Cardiac Cath  [facility]  Interventional Cardiology & Structural Heart  [id number], [place], [place] [id number]  Phone / [phone]   Fax / [phone]     Referring physician: Dr Adriana Simasook  Patient phone number: [phone] (H)  Insurance: BCBS  Information Discussed with: patient on the phone  For questions regarding pre-operative instructions please call: Gaspar ColaChris Whetsell [phone]  Plan:  Cardiac Cath on 01/05/2018.   Marland Kitchen. You will be called the work day prior to your procedure between 2-5pm to confirm your arrival time.   . You will arrive about 1 1/2 hours before your procedure.    Plan for future testing: none needed at this time. In Epic 12/25/17  Pre-Procedure/Op needs- NON FASTING Labs BMP, CBC/diff & PT/INR (within 30 days of procedure)      Pre-procedure/operative instructions:  1. Attire: Please dress in loose comfortable clothing.   2. Diet Instructions:   . You may have a regular diet until 8 hours prior to arrival   . You may have clear liquids (only) until 2 hours prior to arrival   3. Medication instructions:  Contrast Allergy- No      . You may take all other meds with a sip of water, including Aspirin and Plavix.    4. Transportation:  You will need someone to drive you home when you are discharged  . You may wish to pack a bag. Every effort will be made to discharge you home the same day, however you may need to stay overnight  5. Directions day of procedure:  . Your procedure(s) will take place in the [facility] on [address].   . The day of your procedure we encourage you to pull into the circle in front of the [facility] and take advantage of our complimentary valet parking. The entrance to the [facility] is right in the front. Upon entering the [facility], go past the concierge desk, walk past the wall of television screens all the way to the back wall  (curvy lines) and take the elevator on your right up to 2nd floor and check in at the registration desk.  .Marland Kitchen

## 2018-01-01 ENCOUNTER — Telehealth (HOSPITAL_COMMUNITY): Payer: Self-pay | Admitting: Interventional Cardiology

## 2018-01-01 ENCOUNTER — Telehealth (HOSPITAL_COMMUNITY): Payer: Self-pay | Admitting: Thoracic Surgery (Cardiothoracic Vascular Surgery)

## 2018-01-01 NOTE — Patient Instructions (Signed)
Spoke with patient regarding Heart cath cancellation.    Insurance denied testing and suggested medical management as a treatment option for myocardial bridging.      Brandy HaleM. Lucostic sent message to notify all involved including Dr. Fredonia Highlandaybuck, Dr. Lynnell JudeKazienko, and Dr. Su Hiltoberts.    Patient verbalizes understanding.

## 2018-01-01 NOTE — Nursing Note (Signed)
Cath case cancelled due to financial clearance.   C Surg to follow up with patient about cancellation and next steps.

## 2018-01-02 ENCOUNTER — Encounter (HOSPITAL_COMMUNITY): Payer: Self-pay | Admitting: Cardiovascular Disease

## 2018-01-02 ENCOUNTER — Encounter (HOSPITAL_COMMUNITY): Payer: Self-pay | Admitting: Thoracic Surgery (Cardiothoracic Vascular Surgery)

## 2018-01-05 ENCOUNTER — Encounter (HOSPITAL_COMMUNITY): Admission: RE | Payer: Self-pay | Source: Ambulatory Visit

## 2018-01-05 ENCOUNTER — Encounter (HOSPITAL_COMMUNITY): Payer: Self-pay | Admitting: Nurse Practitioner

## 2018-01-05 ENCOUNTER — Inpatient Hospital Stay (HOSPITAL_COMMUNITY)
Admission: RE | Admit: 2018-01-05 | Payer: BLUE CROSS/BLUE SHIELD | Source: Ambulatory Visit | Admitting: Cardiovascular Disease

## 2018-01-05 ENCOUNTER — Telehealth (HOSPITAL_COMMUNITY): Payer: Self-pay | Admitting: Nurse Practitioner

## 2018-01-05 SURGERY — CORONARY ANGIOGRAPHY W/LEFT HEART CATH W/WO LVG
Anesthesia: Moderate Sedation (Nurse Monitored)

## 2018-01-05 MED ORDER — ATENOLOL 25 MG TABLET
12.5000 mg | ORAL_TABLET | Freq: Every day | ORAL | 2 refills | Status: DC
Start: 2018-01-05 — End: 2019-06-14

## 2018-01-05 NOTE — Telephone Encounter (Addendum)
I spoke with the patient regarding her cardiac catheterization not being approved.  Additionally, I [facility], Cardiac Surgery NP, regarding the peer to peer and further treatment plan.  I will go ahead and start the patient on Atenolol 12.5mg  PO daily, prescription sent to [facility] on [address].  I discussed the medication and side effects to be cautious of with the patient, she verbalized understanding.  The patient is going to send me a my chart message in 1 month to let me know how she is tolerating the medication.  The patient was encouraged to contact me with any significant intolerance to the medication.  I will follow the patient in the clinic in approximately 3 months.  If she has any issues in the meantime, she will notify us at the [facility] or proceed to the nearest ER for evaluation.    Kermit Baloavid S. Rankin, MSN, APRN, FNP-C  Cardiology Nurse Practitioner  [facility]      ----- Message from Idelle LeechMatthew Lucostic, APRN sent at 01/01/2018 10:13 AM EST -----  Regarding: RE: peer to peer required  Hello all, this is a patient that Dr. Su Hiltoberts and I had seen in the clinic as a consult for possible symptomatic myocardial bridging diagnosed on CTA heart coronary on 12/15/17.  We had seen her in clinic and had ordered a cardiac catheterization to further assess myocardial bridging but the cath was denied.  I just performed the peer to peer and the physician denied it as she reported the gold standard was a CTA heart and she hasn't been on medical therapy.  I wanted to give all the parties a notice of the current status.  Happy Holidays!  ----- Message -----  From: Stark KleinWorkman, Alexus, RN  Sent: 01/01/2018   7:55 AM EST  To: Idelle LeechMatthew Lucostic, APRN  Subject: FW: peer to peer required                        This is the patient Su [facility]iltRoberts saw [facility] that had suspect myocardial bridging.    ----- Message -----  From: Eugenie BirksWhetsell, Christy, RN  Sent: 01/01/2018   7:25 AM  EST  To: Jules Schickachel Lantz, RN, Alexus Zella BallWorkman, RN, #  Subject: RE: peer to peer required                        Lexi-- Your APP will need to call and do the peer to peer on this case-- since we have never seen the pt.   Please let me know the outcome when you get it  Thanks  Thayer Ohmhris  ----- Message -----  From: Trevor MaceSlayton, Renee Nicole  Sent: 12/30/2017   1:40 PM EST  To: Blenda MountsBryan Raybuck, MD, Alexus Zella BallWorkman, RN, #  Subject: peer to peer required                            PT: Joyce Hunt  MRN: [id number]  Insurance ID #: [id number]  DOS: 01/05/2018  Dr. Fredonia Highlandaybuck    Procedure: Left Heart Cath  Denied Procedure: Left Heart Cath  Denial Reason: Does not meet medical necessity  Contact Information for Peer to Peer: [phone] opt [phone]  Reference Number: [id number]

## 2018-01-05 NOTE — Patient Instructions (Signed)
I spoke to Philipp Deputyave Rankin NP-C with cardiology about case and they will start beta blockade.  He will contact patient and if they feel cath is necessary following medical therapy, they will arrange.  They can refer her back to us as we would love to see her back in clinic.

## 2018-01-05 NOTE — Patient Instructions (Signed)
I did speak with patient about the denial of the cardiac catheterization as well as the rationales given from the peer to peer physician.  She will need to follow up with cardiology for introduction of medical therapy prior to a cardiac catheterization.  I also called to let cardiology know update about her case and no answer.  I left a voicemail on Bank of AmericaDave Rankin NP-C voicemail.

## 2018-01-21 MED FILL — Stelara 45 Mg/0.5ml Pfs: 84 days supply | Qty: 1 | Fill #3 | Status: CP

## 2018-02-12 ENCOUNTER — Other Ambulatory Visit (HOSPITAL_BASED_OUTPATIENT_CLINIC_OR_DEPARTMENT_OTHER): Payer: Self-pay

## 2018-02-16 ENCOUNTER — Encounter (HOSPITAL_BASED_OUTPATIENT_CLINIC_OR_DEPARTMENT_OTHER): Payer: Self-pay | Admitting: Rheumatology

## 2018-02-20 ENCOUNTER — Encounter (HOSPITAL_BASED_OUTPATIENT_CLINIC_OR_DEPARTMENT_OTHER): Payer: Self-pay | Admitting: Rheumatology

## 2018-02-27 ENCOUNTER — Encounter (HOSPITAL_BASED_OUTPATIENT_CLINIC_OR_DEPARTMENT_OTHER): Payer: Self-pay | Admitting: Rheumatology

## 2018-03-05 ENCOUNTER — Other Ambulatory Visit: Payer: Self-pay

## 2018-03-05 ENCOUNTER — Ambulatory Visit
Admission: RE | Admit: 2018-03-05 | Discharge: 2018-03-05 | Disposition: A | Discharge status: Home / Self Care | Payer: 59 | Source: Ambulatory Visit | Attending: Internal Medicine | Admitting: Internal Medicine

## 2018-03-05 DIAGNOSIS — G4719 Other hypersomnia: Secondary | ICD-10-CM

## 2018-03-05 DIAGNOSIS — R0683 Snoring: Secondary | ICD-10-CM

## 2018-03-05 DIAGNOSIS — G4730 Sleep apnea, unspecified: Secondary | ICD-10-CM | POA: Insufficient documentation

## 2018-03-06 ENCOUNTER — Encounter (HOSPITAL_BASED_OUTPATIENT_CLINIC_OR_DEPARTMENT_OTHER): Payer: Self-pay | Admitting: Rheumatology

## 2018-03-06 DIAGNOSIS — G4733 Obstructive sleep apnea (adult) (pediatric): Secondary | ICD-10-CM

## 2018-03-18 ENCOUNTER — Encounter (HOSPITAL_BASED_OUTPATIENT_CLINIC_OR_DEPARTMENT_OTHER): Payer: Self-pay

## 2018-03-18 NOTE — Progress Notes (Signed)
DOS 03/05/2018 Letter sent NSDB noted.  SK Rpsgt

## 2018-03-31 ENCOUNTER — Other Ambulatory Visit (INDEPENDENT_AMBULATORY_CARE_PROVIDER_SITE_OTHER): Payer: Self-pay | Admitting: Pharmacist

## 2018-03-31 MED FILL — Stelara 45 Mg/0.5ml Pfs: 84 days supply | Qty: 1 | Fill #0 | Status: AC

## 2018-03-31 NOTE — Telephone Encounter (Signed)
Specialty Pharmacy Inflammatory Pharmacist Assessment    Treatment Regimen:  Stelara 45mg  SYRINGE - 1 syringe SubQ Q12Weeks  Diagnosis:  Psoriasis, spondyloarthropathy  Treatment Start Date:  Per patient, has been on Stelara since around 2015-2016; however, restarted therapy 04/22/17.    Contacted patient re: Stelara follow-up (03/31/18 @ 3:07PM).  Refill of medication to be picked-up by patient on Friday, 04/03/18; $5 copay with copay card.  Patient reported she usually has a Scientist, physiological administer Stelara injections for her; however, with COVID-19 precautions and limiting contact, patient stated she may complete next injection herself.  Patient stated she has not previously administered injections herself; thus, reviewed injection administration with patient and will provide printed Stelara Instructions for Use for further review.  Also recommended patient review [email or web address] injection training videos if more of a visual learner.  Patient stated she's been experiencing fatigue/tiredness as of late; however, was unsure if related to Stelara or other issue as she is minoring in dance.  Patient inquired if fatigue is a potential side effect of Stelara; advised patient it is a potential rare side effect, with an incidence of around 3%.  Patient experiencing nausea post-injection as well as before next dose is due; stated she does not actually "throw up", nausea is tolerable, and manages via breathing and drinking water.  Patient inquired if nausea is a potential side effect of Stelara; advised patient it also is a potential rare side effect with an incidence of around 3%.  Recommended patient monitor and if becomes worse or severe to contact her provider.  Patient utilizing calendar to assist with adherence with last injection on 01/24/18; noted last injection was delayed due to insurance change.  Patient has no syringes remaining with next injection due on 04/18/18.  Patient reported significant benefit with Stelara and  mentioned she's now able to live "somewhat of a normal life".  Patient stated her psoriasis is currently clear; however, still has scars from previous psoriasis plaques as well as occasional itching of her scalp.  Patient rated current symptoms as a 3 on a scale of 1-10, with 1 being mild and 10 being severe.    Obtained updated medication list.  Patient currently taking:  ibuprofen PRN, multivitamin, vitamin D.  Patient also mentioned she was prescribed atenolol; however, did not yet start taking medication due to concern re: side effects and stated she is trying to manage heart issues with diet and exercise.  Discussed risk vs benefit of medication and recommended patient discuss her atenolol concerns with her provider.  No significant drug-drug interactions with Stelara expected.  Offered to provide supplies (i.e., sharps container, alcohol swabs, band-aids) with refill; patient voiced need of sharps container, will provide along with printed sharps disposal information.  Patient had multiple questions re: Stelara and potential side effects; addressed patient questions to the best of my abilities.  Patient had no additional questions at this time.  Advised patient to contact [facility] [phone]) with future questions or concerns.  Patient expressed appreciation of follow-up.    Anell Barr, PharmD, BCPS, CSP 03/31/2018, 17:20

## 2018-05-11 ENCOUNTER — Other Ambulatory Visit: Payer: Self-pay

## 2018-05-11 ENCOUNTER — Ambulatory Visit (HOSPITAL_BASED_OUTPATIENT_CLINIC_OR_DEPARTMENT_OTHER): Payer: 59

## 2018-07-02 MED FILL — Stelara 45 Mg/0.5ml Pfs: 84 days supply | Qty: 1 | Fill #1 | Status: AC

## 2018-07-15 ENCOUNTER — Ambulatory Visit: Payer: 59

## 2018-07-15 ENCOUNTER — Other Ambulatory Visit: Payer: Self-pay

## 2018-07-15 DIAGNOSIS — L650 Telogen effluvium: Secondary | ICD-10-CM | POA: Insufficient documentation

## 2018-07-15 DIAGNOSIS — L400 Psoriasis vulgaris: Secondary | ICD-10-CM | POA: Insufficient documentation

## 2018-07-15 LAB — IRON TRANSFERRIN AND TIBC
IRON (TRANSFERRIN) SATURATION: 25 % (ref 16–50)
IRON: 105 ug/dL (ref 45–170)
TOTAL IRON BINDING CAPACITY: 421 ug/dL — ABNORMAL HIGH (ref 260–400)
TRANSFERRIN: 301 mg/dL (ref 180–360)

## 2018-07-15 LAB — THYROID STIMULATING HORMONE (SENSITIVE TSH): TSH: 1.719 u[IU]/mL (ref 0.350–5.000)

## 2018-07-17 LAB — QUANTIFERON TB GOLD PLUS, BLOOD
MITOGEN MINUS NIL RESULT: 8.72 IU/mL
NIL RESULT: 0.01 IU/mL
QUANTIFERON, QUALITATIVE: NEGATIVE
TB1 AG MINUS NIL RESULT: 0 IU/mL
TB2 AG MINUS NIL RESULT: 0 IU/mL

## 2018-07-21 ENCOUNTER — Encounter (INDEPENDENT_AMBULATORY_CARE_PROVIDER_SITE_OTHER): Payer: Self-pay | Admitting: Internal Medicine

## 2018-07-27 ENCOUNTER — Encounter (INDEPENDENT_AMBULATORY_CARE_PROVIDER_SITE_OTHER): Payer: Self-pay | Admitting: Internal Medicine

## 2018-07-27 DIAGNOSIS — G478 Other sleep disorders: Secondary | ICD-10-CM

## 2018-07-28 NOTE — Telephone Encounter (Signed)
-----   Message from Concepcion Elk, [place] sent at 07/27/2018  4:48 PM EDT -----  Regarding: FW: Test Results Question  Contact: [phone]    ----- Message -----  From: Joyce Hunt  Sent: 07/27/2018   4:47 PM EDT  To: , #  Subject: Test Results Question                            Hello Dr. Laurance Flatten:    I had a follow up question about my sleep results we had done. I believe it came back normal? 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 Let me know if you would like for me to come in and go over this more or if you have any suggestions that would be great as well. Thank you and I hope you are keeping well.     Joyce Hunt

## 2018-08-13 ENCOUNTER — Other Ambulatory Visit: Payer: Self-pay

## 2018-08-13 ENCOUNTER — Encounter (INDEPENDENT_AMBULATORY_CARE_PROVIDER_SITE_OTHER): Payer: Self-pay | Admitting: Nurse Practitioner

## 2018-08-13 ENCOUNTER — Ambulatory Visit: Payer: 59 | Attending: Nurse Practitioner | Admitting: Nurse Practitioner

## 2018-08-13 VITALS — BP 113/69 | HR 82 | Temp 97.4°F | Ht 62.0 in | Wt 141.1 lb

## 2018-08-13 DIAGNOSIS — R29818 Other symptoms and signs involving the nervous system: Secondary | ICD-10-CM

## 2018-08-13 DIAGNOSIS — Q245 Malformation of coronary vessels: Secondary | ICD-10-CM | POA: Insufficient documentation

## 2018-08-13 DIAGNOSIS — R0683 Snoring: Secondary | ICD-10-CM

## 2018-08-13 DIAGNOSIS — R069 Unspecified abnormalities of breathing: Secondary | ICD-10-CM | POA: Insufficient documentation

## 2018-08-13 DIAGNOSIS — R5383 Other fatigue: Secondary | ICD-10-CM | POA: Insufficient documentation

## 2018-08-13 DIAGNOSIS — Z79899 Other long term (current) drug therapy: Secondary | ICD-10-CM | POA: Insufficient documentation

## 2018-08-13 DIAGNOSIS — R0689 Other abnormalities of breathing: Secondary | ICD-10-CM

## 2018-08-13 DIAGNOSIS — Z87891 Personal history of nicotine dependence: Secondary | ICD-10-CM | POA: Insufficient documentation

## 2018-08-13 NOTE — H&P (Signed)
[facility]  Physician Office Center    Patient Name: Joyce Hunt  MRN#: [id number]  DOB: June 13, 1980  Date of Service: 08/13/2018    CC: Sleep Apnea          HPI: The patient 38 y.o. fmeale referred to the Sleep Clinic for evaluation of nocturnal gasping. She explains for a few months she has been waking during the night feeling like she can't breathe and she is gasping. She describes feeling a chest "heaviness". She also endorses snoring, restlessness, weight gain (20 lbs). She describes feeling like she only gets light stage sleep. She rarely feels refreshed or remembers dreaming. Her husband tells her it looks like she isn't breathing sometimes. She does feel tired during the day, but usually does not have trouble with dozing off. She does mention having some increased stress recently has her mother-in-law is ill with cancer. Mother-in-law lives with them currently. She does see a therapist weekly and exercises to deal with her stresses. Hx of myocardial bridge; was started on atenolol 12.5mg , she is currently not taking this medication however.     Duration of symptomatic sleep disorder: few months  Bedpartner present/absent: absent    Sleep Hygiene:    Bedtime: 11PM    Sleep Latency: 20-30 minutes    Nocturnal awakenings: 3x    Latency after awakenings: 10-15 minutes    Wake up time: 9:30AM    Condition in the AM: Feels okay, not particularly refreshed    Naps/days: How many?: Infrequent    Weekend/Holidays/Vacation:  Same sleep routine    Shift Work: Denies; when in school she typically stays up later and may wake earlier. Taking classes in dancing      Sleep Disordered Breathing:    Snoring: Yes    Apnea: Yes    AM Headaches: Denies    Nocturnal Motor activity: Yes    Diaphoresis: Denies    Previous Work up: HST-negative    Recent Weight Changes: Weight gain of 20-25 lbs    Family History of Obstructive Sleep Apnea: Denies    Requiring Separate Rooms: Denies    Nocturnal Coughing/Choking:  Yes    Daytime Sleepiness:    Excessive Daytime Sleepiness: Denies    Epworth Sleepiness Scale: 3    Sleeping when Driving: Denies    History of Accidents from Falling Asleep: Denies    Decrease memory/Concentration: Denies    History of Related Disciplinary/Administrative Action: Denies    Caffeine Usage: Minimal    Insomnia: Denies    Other Sleep Symptoms:     Sleep Paralysis: Denies    Hypnagogic Hallucinations: Denies    Cataplexy: Denies    Sleep Walking: Denies    Restless Legs: Denies    Bruxism: Denies    Nightmares: Denies        Associated diseases: Myocardial Bridging mid LAD        PastMedical History:  Past Medical History:   Diagnosis Date   . Abdominal pain 10/22/2015    ED Visit > USG showed ovaries with small cysts bilaterally only   . Abnormal Pap smear of cervix 2012 ?    [place]   ? [place]   . Arthritis     Ruled out RA   Rheumatologist Dr. Sherryll BurgerShah 05/2016    . Dysfunctional uterine bleeding 10/30/2015   . Elevated liver enzymes 03/2016    Sees Dr. Sherryll BurgerShah Rheumatology    . Fall 11/2015    [place] >  X-Ray without fracture > MRI 12/2015 - Negative for fracture; saw slight abnormality iin left femoral neck/socket area    . Keratosis pilaris 06/04/2016   . Migraine with aura     Ocular Migraine reported 07/2016 > Motrin 400 mg po relief.    . Plaque psoriasis 2015    Stelara RX > stopped 03/2016  OTC Vitamin D3  Sees Dr. Flint MelterKristen Moore PCP for care   . Severe dysmenorrhea 09/02/2016       Past Surgical History:   Procedure Laterality Date   . HX COLPOSCOPY  2012 ?     [place]  ? results    . HX ELECTIVE ABORTION  2009    Vaccum Aspiration without complications        Medications:  Prior to Admission medications    Medication Sig Start Date End Date Taking? Authorizing Provider   atenolol (TENORMIN) 25 mg Oral Tablet Take 0.5 Tabs (12.5 mg total) by mouth Once a day  Patient not taking: Reported on 08/13/2018 01/05/18   Rankin, Janeann Forehandavid Samuel, APRN,NP-C   cholecalciferol, vitamin D3,  1,000 unit Oral Tablet Take 2,000 Units by mouth Once a day   Yes Provider, Historical   cyclobenzaprine (FLEXERIL) 5 mg Oral Tablet Take 1 Tab (5 mg total) by mouth Every night as needed for Muscle spasms  Patient not taking: Reported on 08/13/2018 12/14/17   Denese Killingsppenlander, Kathryn, MD   Ibuprofen (MOTRIN) 200 mg Oral Tablet Take 200 mg by mouth Four times a day as needed for Pain   Yes Provider, Historical   lidocaine (LIDODERM) 5 % Adhesive Patch, Medicated by Transdermal route Once a day    Provider, Historical   multivitamin Oral Tablet Take 1 Tab by mouth Once a day   Yes Provider, Historical   ustekinumab (STELARA) 45 mg/0.5 mL Subcutaneous Syringe 45 mg by Subcutaneous route Every 3 months   Yes Provider, Historical       Allergies:   Allergies   Allergen Reactions   . Mushrooms [Mushroom Flavor] Itching and Swelling       Social History:  Social History     Socioeconomic History   . Marital status: Married     Spouse name: Not on file   . Number of children: Not on file   . Years of education: Not on file   . Highest education level: Not on file   Occupational History   . Occupation: Interior and spatial designerUnit Clerk      Employer: [employer]     Comment: [employer] [place]    . Occupation: [employer] safety [employer] Student      Comment: Psych    Social Needs   . Financial resource strain: Not on file   . Food insecurity     Worry: Not on file     Inability: Not on file   . Transportation needs     Medical: Not on file     Non-medical: Not on file   Tobacco Use   . Smoking status: Former Smoker     Packs/day: 0.25     Years: 3.00     Pack years: 0.75     Last attempt to quit: 08/20/2006     Years since quitting: 11.9   . Smokeless tobacco: Never Used   Substance and Sexual Activity   . Alcohol use: Yes     Alcohol/week: 0.0 - 2.0 standard drinks   . Drug use: No   . Sexual activity: Yes     Partners: Male  Birth control/protection: Condom     Comment:  Married 06/2015   Lifestyle   . Physical activity     Days per week: Not on file     Minutes per session:  Not on file   . Stress: Not on file   Relationships   . Social Wellsite geologistconnections     Talks on phone: Not on file     Gets together: Not on file     Attends religious service: Not on file     Active member of club or organization: Not on file     Attends meetings of clubs or organizations: Not on file     Relationship status: Not on file   . Intimate partner violence     Fear of current or ex partner: Not on file     Emotionally abused: Not on file     Physically abused: Not on file     Forced sexual activity: Not on file   Other Topics Concern   . Abuse/Domestic Violence Not Asked   . Breast Self Exam Not Asked   . Caffeine Concern Not Asked   . Calcium intake adequate Not Asked   . Computer Use Not Asked   . Drives Not Asked   . Exercise Concern Not Asked   . Helmet Use Not Asked   . Seat Belt Not Asked   . Special Diet Not Asked   . Sunscreen used Not Asked   . Uses Cane Not Asked   . Uses walker Not Asked   . Uses wheelchair Not Asked   . Right hand dominant Not Asked   . Left hand dominant Not Asked   . Ambidextrous Not Asked   . Shift Work Not Asked   . Unusual Sleep-Wake Schedule Not Asked   Social History Narrative   . Not on file       Family History:   Family Medical History:     Problem Relation (Age of Onset)    Arthritis-rheumatoid Paternal Grandmother    Cirrhosis Father    Diabetes Mother, Maternal Aunt    Heart Attack Father    Heart Disease Father (50)    Hypertension (High Blood Pressure) Mother    Liver Disease Father    Ovarian Cancer Maternal Grandmother (35)    Psoriasis Maternal Uncle    Stroke Maternal Grandfather    Sudden Death no cause Father          Immunizations:   Immunization History   Administered Date(s) Administered   . Influenza Vaccine IM (ADMIN) 10/12/2014, 10/30/2015, 10/09/2016, 10/10/2017       Review of Systems:   Constitutional: (-) fever (-) chills (+) fatigue (-) unintentional weight loss  Eyes: (-) vision changes (-) itchy or burning eyes  Ears/Nose/Thoat/Mouth: (+) snoring   (-) oral lesions  (-) sore throat (-) nasal congestion  CV: (-) chest pain (-) palpitations (-) edema   Pulm: (-) shortness of breath (-) cough (+) nocturnal gasping  (-) dyspnea on exertion  GI: (-) abl pain (-) nausea (-) vomiting (-) diarrhea (-) constipation  Genitourinary: (-) dysuria (-) nocturia (-) incontinence (-) urgency  Musculoskeletal: (-) joint pain (-) joint swelling (-) muscle pain  Neurologic: (-) memory loss (-) paresthesias (-) dizziness (-) headaches  Skin: (-) rash (-) itching (-) jaundice  Psych: (-) anxiety (-) depression (-) insomnia   Endocrine: (-) heat intolerance (-) cold intolerance  Heme/Lymph: (-) lymphadenopathy (-) easy bruising  Allergy/Immune: (-) hives (-) seasonal  allergies     Physical Exam: The patient's current vitals are BP 113/69   Pulse 82   Temp 36.3 C (97.4 F) (Tympanic)   Ht 1.575 m (5\' 2" )   Wt 64 kg (141 lb 1.5 oz)   SpO2 97%   BMI 25.81 kg/m       Constitutional: NAD, appears well, very pleasant  Eyes: Conjunctiva clear, no drainage noted.   Ears/nose/mouth/throat: Oropharynx Mallampati Class IV, dentition good   Neck: 15 Inches @ circothyroid membrane and neck supple  Respiratory: clear to auscultation bilaterally, chest expansion symmetric   Cardiovascular: RRR without murmur, no edema  GI: Abdomen is soft, non-tender, bowel sounds normal  Musculoskeletal: Full ROM intact, No joint swelling or erythema  Skin: Skin warm and dry without discoloration  Neurologic: EOMI, CN II-XII intact  Lymphatics/Heme: No nodes appreciated, No bruising  Psychiatric: Patient is alert, displaying appropriate behavior and speech     Review Records:   Unattended Study 03/06/2018   AHI 0.4, lowest O2 sat 83%    ASSESSMENT/ PLAN:     1. Suspected Sleep Apnea  2. Snoring  3. Fatigue  4. Gasping for breath  5. Myocardial Bridging   -Given her high pre-test likelihood for apnea will order in-lab evaluation to rule out sleep apnea  -Also advised she get back in to see cardiology;  previously cardiac cath was denied and started on atenolol. She is no longer taking the atenolol.  -Reviewed sleep hygiene and encouraged continued stress management  -Do not suspect other sleep disrupters causing symptoms.      RTC: Pending Sleep Study      Patient was seen independently.      Larey Days, APRN,NP-C  08/13/2018, 13:38    Joseph Pierini, FNP-C  [facility]  Department of Medicine  Section of Pulmonary and Sleep Medicine

## 2018-08-30 ENCOUNTER — Encounter (INDEPENDENT_AMBULATORY_CARE_PROVIDER_SITE_OTHER): Payer: Self-pay | Admitting: Internal Medicine

## 2018-08-30 ENCOUNTER — Encounter (HOSPITAL_COMMUNITY): Payer: Self-pay | Admitting: Nurse Practitioner

## 2018-08-30 ENCOUNTER — Other Ambulatory Visit: Payer: Self-pay

## 2018-08-31 ENCOUNTER — Telehealth (INDEPENDENT_AMBULATORY_CARE_PROVIDER_SITE_OTHER): Payer: Self-pay | Admitting: Internal Medicine

## 2018-08-31 ENCOUNTER — Other Ambulatory Visit: Payer: Self-pay

## 2018-08-31 ENCOUNTER — Telehealth (HOSPITAL_COMMUNITY): Payer: Self-pay | Admitting: Nurse Practitioner

## 2018-08-31 ENCOUNTER — Encounter (INDEPENDENT_AMBULATORY_CARE_PROVIDER_SITE_OTHER): Payer: Self-pay | Admitting: Internal Medicine

## 2018-08-31 NOTE — Telephone Encounter (Signed)
Called patient prior to appointment to prescreen for COVID-19.      Have you had new or worsened shortness of breath in the past 14 days?  No  Have you had a new or worsening cough in the past 14 days?  No  Have you had a fever in the past 14 days?  No  Have you experienced a loss of taste or smell in the past 14 days? No  Have you experienced headache with nausea in the past 14 days? No    Patient or patients guardian/attendant has a Negative Prescreen.      Patient informed of visitor policy at this time. YES    Informed patient that we are requesting all patients and visitors wear a mask when entering the clinic.   YES    Appointment notes have been updated to reflect screening.   YES    Patient instructed to present to the clinic for scheduled appointment.    Joyce Hunt  08/31/2018, 14:36

## 2018-08-31 NOTE — Patient Instructions (Signed)
Patient sent [place] a message that she stopped taking her beta blocker cardiology prescribed as she didn't like how it made her feel or the side effect profile it had.  I left her a voicemail to contact her cardiology team as this would be important information for them to know.

## 2018-08-31 NOTE — Progress Notes (Deleted)
[facility]    OUTPATIENT PROGRESS NOTE    No chief complaint on file.      Subjective:   [name] is a pleasant 38 y.o. female who is a regular patient of *** who comes to clinic today ***     Reviewed today (Per EMR): {Reviewed:14835}         Review of systems:   General: negative for fevers, chills, change in appetite, unexplained weight changes  HENT: negative for cold symptoms, congestion, sore throat  CV: negative for chest pain, edema, palpitations  Resp: negative for cough, wheezing, shortness of breath  GI: negative for abdominal pain, nausea, vomiting, diarrhea, constipation, change in bowel habits, blood in stool  GU: negative for dysuria, hematuria, ***  MSK: negative for joint pains, muscle pains, joint swelling  Neuro: negative for headaches, dizziness, balance problems, tingling, numbness, focal weakness  Psych: negative for depression, anxiety, sleep disturbance, SI    Problem List:     Patient Active Problem List   Diagnosis   . Plaque psoriasis   . Migraine with aura   . Arthritis   . Keratosis pilaris   . Elevated liver enzymes   . Severe dysmenorrhea   . History of angina       Medications:     Current Outpatient Medications   Medication Sig   . atenolol (TENORMIN) 25 mg Oral Tablet Take 0.5 Tabs (12.5 mg total) by mouth Once a day (Patient not taking: Reported on 08/13/2018)   . cholecalciferol, vitamin D3, 1,000 unit Oral Tablet Take 2,000 Units by mouth Once a day   . cyclobenzaprine (FLEXERIL) 5 mg Oral Tablet Take 1 Tab (5 mg total) by mouth Every night as needed for Muscle spasms (Patient not taking: Reported on 08/13/2018)   . Ibuprofen (MOTRIN) 200 mg Oral Tablet Take 200 mg by mouth Four times a day as needed for Pain   . lidocaine (LIDODERM) 5 % Adhesive Patch, Medicated by Transdermal route Once a day   . multivitamin Oral Tablet Take 1 Tab by mouth Once a day   . ustekinumab (STELARA) 45 mg/0.5 mL Subcutaneous Syringe 45 mg by Subcutaneous route  Every 3 months       Allergies:     Allergies   Allergen Reactions   . Mushrooms [Mushroom Flavor] Itching and Swelling       Family History:     Family Medical History:     Problem Relation (Age of Onset)    Arthritis-rheumatoid Paternal Grandmother    Cirrhosis Father    Diabetes Mother, Maternal Aunt    Heart Attack Father    Heart Disease Father (50)    Hypertension (High Blood Pressure) Mother    Liver Disease Father    Ovarian Cancer Maternal Grandmother (35)    Psoriasis Maternal Uncle    Stroke Maternal Grandfather    Sudden Death no cause Father              Social History:     Social History     Tobacco Use   . Smoking status: Former Smoker     Packs/day: 0.25     Years: 3.00     Pack years: 0.75     Last attempt to quit: 08/20/2006     Years since quitting: 12.0   . Smokeless tobacco: Never Used   Substance Use Topics   . Alcohol use: Yes     Alcohol/week: 0.0 - 2.0 standard drinks   .  Drug use: No       Objective:   Vitals:  not currently breastfeeding.   There is no height or weight on file to calculate BMI.  General: NAD, non-toxic  HEENT: Pupils equal, sclera clear, TMs clear, throat normal  Neck: supple, non-tender, no cervical adenopathy, no thyromegaly  CV: regular rate and rhythm, no murmurs, no edema, distal pulses 2+ and equal, carotids without bruit  Lungs: normal respiratory effort, no use of accessory muscles, clear to auscultation bilaterally, no adventitious sounds  Abdomen: normal bowel sounds, soft, non-tender, no hepatosplenomegaly, no CVA tenderness  Ext: no edema  Neuro: alert and oriented x3, no focal deficits, cranial nerves grossly intact, gait normal  Skin: no rash      Labs:   ***      Assessment & Plan:   There are no diagnoses linked to this encounter.                 RTC if symptoms persist or worsen.  The patient has been educated and verbalized understanding regarding the services provided during this visit.    Tandy Gaw, MD  Internal Medicine and  Pediatrics  Assistant Professor    [facility]  [address], [place]. [place], [place] [address]  [phone]

## 2018-09-01 ENCOUNTER — Ambulatory Visit (INDEPENDENT_AMBULATORY_CARE_PROVIDER_SITE_OTHER): Payer: 59 | Admitting: Internal Medicine

## 2018-09-01 ENCOUNTER — Encounter (FREE_STANDING_LABORATORY_FACILITY)
Admit: 2018-09-01 | Discharge: 2018-09-01 | Disposition: A | Discharge status: Home / Self Care | Payer: 59 | Attending: Internal Medicine | Admitting: Internal Medicine

## 2018-09-01 ENCOUNTER — Encounter (INDEPENDENT_AMBULATORY_CARE_PROVIDER_SITE_OTHER): Payer: Self-pay | Admitting: Internal Medicine

## 2018-09-01 ENCOUNTER — Encounter (FREE_STANDING_LABORATORY_FACILITY): Payer: 59 | Admitting: Internal Medicine

## 2018-09-01 VITALS — BP 114/70 | HR 71 | Temp 97.2°F | Resp 20 | Ht 62.0 in | Wt 139.8 lb

## 2018-09-01 DIAGNOSIS — Z0189 Encounter for other specified special examinations: Secondary | ICD-10-CM

## 2018-09-01 DIAGNOSIS — M545 Low back pain, unspecified: Secondary | ICD-10-CM

## 2018-09-01 DIAGNOSIS — IMO0001 Reserved for inherently not codable concepts without codable children: Secondary | ICD-10-CM

## 2018-09-01 DIAGNOSIS — E559 Vitamin D deficiency, unspecified: Secondary | ICD-10-CM | POA: Insufficient documentation

## 2018-09-01 DIAGNOSIS — R209 Unspecified disturbances of skin sensation: Secondary | ICD-10-CM

## 2018-09-01 LAB — VITAMIN B12: VITAMIN B 12: 661 pg/mL (ref 200–1000)

## 2018-09-01 NOTE — Addendum Note (Signed)
Addended by: Cain Sieve R on: 09/01/2018 01:45 PM     Modules accepted: Level of Service, SmartSet

## 2018-09-01 NOTE — Progress Notes (Signed)
[facility]    OUTPATIENT PROGRESS NOTE    Leg Pain (RIGHT/ PINCH LIKE FEELING) and Numbness      Subjective:   [name] is a pleasant 38 y.o. female who is a regular patient of mine who comes to clinic today for numbness/tingling.  Feels pinching sensation in right leg, and presthesias from thigh to ankle, feels tightness around knee. Occurs at night mostly, sometimes if she is standing or walking. Had massage and reports therapist appreciated tightness in right leg. Has had similar symptoms on left side as well, not as severe and it always starts on right.  She does have some low back pain, pinching sensation in right buttock.  She is concerned it may be due to poor circulation.   Reviewed today (Per EMR): active problem list, medication list, allergies         Review of systems:   General: negative for fevers, chills  Endo: positive for hair loss  MSK: positive for low back pain      Problem List:     Patient Active Problem List   Diagnosis   . Plaque psoriasis   . Migraine with aura   . Arthritis   . Keratosis pilaris   . Elevated liver enzymes   . Severe dysmenorrhea   . History of angina       Medications:     Current Outpatient Medications   Medication Sig   . atenolol (TENORMIN) 25 mg Oral Tablet Take 0.5 Tabs (12.5 mg total) by mouth Once a day (Patient not taking: Reported on 08/13/2018)   . cholecalciferol, vitamin D3, 1,000 unit Oral Tablet Take 2,000 Units by mouth Once a day   . cyclobenzaprine (FLEXERIL) 5 mg Oral Tablet Take 1 Tab (5 mg total) by mouth Every night as needed for Muscle spasms (Patient not taking: Reported on 08/13/2018)   . Ibuprofen (MOTRIN) 200 mg Oral Tablet Take 200 mg by mouth Four times a day as needed for Pain   . lidocaine (LIDODERM) 5 % Adhesive Patch, Medicated by Transdermal route Once a day   . multivitamin Oral Tablet Take 1 Tab by mouth Once a day   . ustekinumab (STELARA) 45 mg/0.5 mL Subcutaneous Syringe 45 mg by Subcutaneous route  Every 3 months       Allergies:     Allergies   Allergen Reactions   . Mushrooms [Mushroom Flavor] Itching and Swelling       Family History:     Family Medical History:     Problem Relation (Age of Onset)    Arthritis-rheumatoid Paternal Grandmother    Cirrhosis Father    Diabetes Mother, Maternal Aunt    Heart Attack Father    Heart Disease Father (50)    Hypertension (High Blood Pressure) Mother    Liver Disease Father    Ovarian Cancer Maternal Grandmother (35)    Psoriasis Maternal Uncle    Stroke Maternal Grandfather    Sudden Death no cause Father              Social History:     Social History     Tobacco Use   . Smoking status: Former Smoker     Packs/day: 0.25     Years: 3.00     Pack years: 0.75     Last attempt to quit: 08/20/2006     Years since quitting: 12.0   . Smokeless tobacco: Never Used   Substance Use Topics   .  Alcohol use: Yes     Alcohol/week: 0.0 - 2.0 standard drinks   . Drug use: No       Objective:   Vitals:  Blood pressure 114/70, pulse 71, temperature 36.2 C (97.2 F), temperature source Thermal Scan, resp. rate 20, height 1.575 m (5\' 2" ), weight 63.4 kg (139 lb 12.4 oz), last menstrual period 08/19/2018, SpO2 99 %, not currently breastfeeding.   Body mass index is 25.56 kg/m.  General: NAD, non-toxic  Ext: no edema, no tenderness lower extremities  Peripheral pulses 2+ equal dorsalis pedis, posterior tibial and popliteal  No spinous process tenderness  No joint swelling, redness or warmth  Neuro: lower ext sensation normal to monofilament and proprioception  Skin: no rash          Assessment & Plan:   Ashlan was seen today for leg pain and numbness.    Diagnoses and all orders for this visit:    Paresthesias/numbness  -     NCS/EMG; Future  -     XR LUMBAR SPINE SERIES; Future  -     Refer to [facility] Neurology; Future  -     VITAMIN B12; Future  -     VITAMIN B12    Low back pain  -     NCS/EMG; Future  -     XR LUMBAR SPINE SERIES; Future  -     Refer to [facility] Neurology; Future  -      VITAMIN B12; Future  -     VITAMIN B12    Vitamin D deficiency  -     VITAMIN D 25, TOTAL; Future  -     VITAMIN D 25, TOTAL                     RTC if symptoms persist or worsen.  The patient has been educated and verbalized understanding regarding the services provided during this visit.    Tandy Gaw, MD  Internal Medicine and Pediatrics  Assistant Professor    [facility]  [address], [place]. [place], [place] [address]  [phone]

## 2018-09-01 NOTE — Nurses Notes (Signed)
Department of Community Practice     Venipuncture performed in office on left arm antecubital vein, dry pressure dressing was applied to site and patient tolerated it well.  Specimen was centrifuged, aliquoted as needed and specimen was labeled and packaged for transport.    Cain Sieve, Ambulatory Care Assistant  09/01/2018, 13:45

## 2018-09-02 LAB — VITAMIN D 25, TOTAL: VITAMIN D, 25OH: 44 ng/mL (ref 30–100)

## 2018-09-02 NOTE — Addendum Note (Signed)
Addended by: Olean Ree on: 09/02/2018 12:02 PM     Modules accepted: Level of Service

## 2018-09-04 ENCOUNTER — Ambulatory Visit
Admission: RE | Admit: 2018-09-04 | Discharge: 2018-09-04 | Disposition: A | Discharge status: Home / Self Care | Payer: 59 | Source: Ambulatory Visit | Attending: Nurse Practitioner | Admitting: Nurse Practitioner

## 2018-09-04 ENCOUNTER — Other Ambulatory Visit: Payer: Self-pay

## 2018-09-04 DIAGNOSIS — R0689 Other abnormalities of breathing: Secondary | ICD-10-CM | POA: Insufficient documentation

## 2018-09-04 DIAGNOSIS — G4733 Obstructive sleep apnea (adult) (pediatric): Secondary | ICD-10-CM

## 2018-09-04 DIAGNOSIS — R29818 Other symptoms and signs involving the nervous system: Secondary | ICD-10-CM | POA: Insufficient documentation

## 2018-09-04 DIAGNOSIS — R0683 Snoring: Secondary | ICD-10-CM | POA: Insufficient documentation

## 2018-09-04 DIAGNOSIS — R5383 Other fatigue: Secondary | ICD-10-CM

## 2018-09-04 DIAGNOSIS — Q245 Malformation of coronary vessels: Secondary | ICD-10-CM | POA: Insufficient documentation

## 2018-09-07 ENCOUNTER — Other Ambulatory Visit: Payer: Self-pay

## 2018-09-11 ENCOUNTER — Encounter (INDEPENDENT_AMBULATORY_CARE_PROVIDER_SITE_OTHER): Payer: Self-pay | Admitting: Nurse Practitioner

## 2018-09-11 ENCOUNTER — Encounter (INDEPENDENT_AMBULATORY_CARE_PROVIDER_SITE_OTHER): Payer: Self-pay | Admitting: Neurology

## 2018-09-11 ENCOUNTER — Other Ambulatory Visit: Payer: Self-pay

## 2018-09-11 ENCOUNTER — Ambulatory Visit: Payer: 59 | Attending: Neurology | Admitting: Neurology

## 2018-09-11 VITALS — BP 114/59 | HR 91 | Temp 97.4°F | Ht 62.0 in | Wt 138.7 lb

## 2018-09-11 DIAGNOSIS — M79604 Pain in right leg: Secondary | ICD-10-CM | POA: Insufficient documentation

## 2018-09-11 DIAGNOSIS — L409 Psoriasis, unspecified: Secondary | ICD-10-CM | POA: Insufficient documentation

## 2018-09-11 DIAGNOSIS — R200 Anesthesia of skin: Secondary | ICD-10-CM | POA: Insufficient documentation

## 2018-09-11 DIAGNOSIS — M545 Low back pain, unspecified: Secondary | ICD-10-CM

## 2018-09-11 DIAGNOSIS — Z87891 Personal history of nicotine dependence: Secondary | ICD-10-CM | POA: Insufficient documentation

## 2018-09-11 DIAGNOSIS — IMO0001 Reserved for inherently not codable concepts without codable children: Secondary | ICD-10-CM

## 2018-09-11 DIAGNOSIS — R202 Paresthesia of skin: Secondary | ICD-10-CM | POA: Insufficient documentation

## 2018-09-11 NOTE — Progress Notes (Signed)
[facility] Department of Neurology  Outpatient History and Physical    Date:  09/11/2018  Name: Joyce Hunt  Age:  38 y.o.  Referring Physician:     Flint MelterMoore, Kristen, MD  [address]  [place], [place] [id number]    History of Present Illness  History obtained from:  patient    Joyce Hunt is a 38 y.o. female who presents with a chief complaint of   Chief Complaint   Patient presents with   . New Patient     paresthesias/ numbness         The patient reports:         Here for numbness and tingling in legs, R >L,    Low back pain    Psoriasis 8-9 years    On immunosuppressive therapy        PCP concerned about neuropathy          Results for Joyce PerchesCHAND, Marrah (MRN [id number]) as of 09/11/2018 09:23   Ref. Range 12/25/2017 17:20 07/15/2018 14:29 09/01/2018 11:37   WBC Latest Ref Range: 3.7 - 11.0 x10^3/uL 9.2     HGB Latest Ref Range: 11.5 - 16.0 g/dL 29.513.6     HCT Latest Ref Range: 34.8 - 46.0 % 40.4     PLATELET COUNT Latest Ref Range: 150 - 400 x10^3/uL 309     RBC Latest Ref Range: 3.85 - 5.22 x10^6/uL 4.62     MCV Latest Ref Range: 78.0 - 100.0 fL 87.4     MCHC Latest Ref Range: 31.0 - 35.5 g/dL 28.433.7     MCH Latest Ref Range: 26.0 - 32.0 pg 29.4     RDW-CV Latest Ref Range: 11.5 - 15.5 % 11.9     MPV Latest Ref Range: 8.7 - 12.5 fL 9.3     PMN'S Latest Units: % 54     LYMPHOCYTES Latest Units: % 38     EOSINOPHIL Latest Units: % 1     MONOCYTES Latest Units: % 7     BASOPHILS Latest Units: % 0     IMMATURE GRANULOCYTE % Latest Ref Range: 0 - 1 % 0     IMMATURE GRANULOCYTE # Latest Ref Range: <0.10 x10^3/uL <0.10     PMN ABS Latest Ref Range: 1.50 - 7.70 x10^3/uL 4.88     LYMPHS ABS Latest Ref Range: 1.00 - 4.80 x10^3/uL 3.52     EOS ABS Latest Ref Range: <=0.50 x10^3/uL <0.10     MONOS ABS Latest Ref Range: 0.20 - 1.10 x10^3/uL 0.63     BASOS ABS Latest Ref Range: <=0.20 x10^3/uL <0.10     PROTHROMBIN TIME Latest Ref Range: 9.5 - 14.1 seconds 12.7     INR Latest Ref Range: 0.80 - 1.20  1.08     SODIUM Latest Ref Range: 136 - 145  mmol/L 138     POTASSIUM Latest Ref Range: 3.5 - 5.1 mmol/L 4.5     CHLORIDE Latest Ref Range: 96 - 111 mmol/L 105     CARBON DIOXIDE Latest Ref Range: 22 - 32 mmol/L 24     BUN Latest Ref Range: 8 - 25 mg/dL 12     CREATININE Latest Ref Range: 0.49 - 1.10 mg/dL 1.320.83     GLUCOSE Latest Ref Range: 65 - 139 mg/dL 86     ANION GAP Latest Ref Range: 4 - 13 mmol/L 9     BUN/CREAT RATIO Latest Ref Range: 6 - 22  14  ESTIMATED GLOMERULAR FILTRATION RATE Latest Ref Range: >60 mL/min/1.5456m^2 >60     CALCIUM Latest Ref Range: 8.5 - 10.2 mg/dL 9.9     IRON Latest Ref Range: 45 - 170 ug/dL  161105    IRON BINDING CAPACITY Latest Ref Range: 260 - 400 ug/dL  096421 (H)    IRON SATURATION Latest Ref Range: 16 - 50 %  25    TRANSFERRIN Latest Ref Range: 180 - 360 mg/dL  045301    TSH Latest Ref Range: 0.350 - 5.000 uIU/mL  1.719    VITAMIN B12 Latest Ref Range: 200-1,000 pg/mL   661   VITAMIN D 25, TOTAL Unknown   Rpt   ABO/RH(D) Unknown O POSITIVE     ANTIBODY SCREEN Unknown NEGATIVE     SPECIMEN EXPIRATION DATE Unknown 12/28/2017     UNITS ORDERED Unknown NOT STATED        Joyce Hunt  Female, 38 years old.    MRI BRAIN WO CONTRAST performed on 11/24/2017 6:29 PM.    REASON FOR EXAM:  R42: Vertigo  G44.52: New daily persistent headache    COMPARISON: None    FINDINGS:  There is preserved morphology of the brain parenchyma without  any mass lesions. The ventricles are within normal limits. There is a  single focus of subcortical white matter signal change in the left frontal  lobe that does not demonstrate any mass effect. This is nonspecific  however, given the patient's history of headaches this can represent  migraine associated white matter signal change.    IMPRESSION:   Apart from a single benign nonspecific focus of subcortical white matter  signal change in the left frontal lobe that may relate to migraine  associated white matter changes, the examination is unremarkable without  any focal abnormality.  Joyce Hunt  Female, 38 years old.    XR LUMBAR SPINE SERIES performed on 09/18/2016 2:45 PM.    REASON FOR EXAM:  M54.9: Back pain, unspecified back location, unspecified  back pain laterality, unspecified chronicity  L40.9: Psoriasis  R79.89: Elevated liver function tests    TECHNIQUE: 5 views/5 images submitted for interpretation.    COMPARISON:  None available    FINDINGS:  There is mild straightening of the normal cervical lordosis to  approximately 16 degrees. There is no evidence of acute fracture or  traumatic malalignment. No abnormal fusion or significant degenerative  changes are identified. The neural foramina appear widely patent. Asymmetry  of the sacroiliac joints is likely secondary to projection.    IMPRESSION:  No acute osseous abnormality or specific evidence for inflammatory  arthropathy.    Past Medical History  Current Outpatient Medications   Medication Sig   . atenolol (TENORMIN) 25 mg Oral Tablet Take 0.5 Tabs (12.5 mg total) by mouth Once a day (Patient not taking: Reported on 08/13/2018)   . cholecalciferol, vitamin D3, 1,000 unit Oral Tablet Take 2,000 Units by mouth Once a day   . cyclobenzaprine (FLEXERIL) 5 mg Oral Tablet Take 1 Tab (5 mg total) by mouth Every night as needed for Muscle spasms (Patient not taking: Reported on 08/13/2018)   . Ibuprofen (MOTRIN) 200 mg Oral Tablet Take 200 mg by mouth Four times a day as needed for Pain   . lidocaine (LIDODERM) 5 % Adhesive Patch, Medicated by Transdermal route Once a day   . multivitamin Oral Tablet Take 1 Tab by mouth Once a day   . ustekinumab (STELARA) 45 mg/0.5 mL Subcutaneous Syringe 45 mg  by Subcutaneous route Every 3 months     Allergies   Allergen Reactions   . Mushrooms [Mushroom Flavor] Itching and Swelling     Past Medical History:   Diagnosis Date   . Abdominal pain 10/22/2015    ED Visit > USG showed ovaries with small cysts bilaterally only   . Abnormal Pap smear of cervix 2012 ?    [place], [place]   ? reading   . Arthritis      Ruled out RA   Rheumatologist Dr. Sherryll Burger 05/2016    . Back injury    . Dysfunctional uterine bleeding 10/30/2015   . Elevated liver enzymes 03/2016    Sees Dr. Sherryll Burger Rheumatology    . Fall 11/2015    steps in home fall in [place] > X-Ray without fracture > MRI 12/2015 - Negative for fracture; saw slight abnormality iin left femoral neck/socket area    . History of dizziness    . Keratosis pilaris 06/04/2016   . Migraine    . Migraine with aura     Ocular Migraine reported 07/2016 > Motrin 400 mg po relief.    . Plaque psoriasis 2015    Stelara RX > stopped 03/2016  OTC Vitamin D3  Sees Dr. Flint Melter PCP for care   . Severe dysmenorrhea 09/02/2016         Past Surgical History:   Procedure Laterality Date   . HX COLPOSCOPY  2012 ?     [place], [place]  ? results    . HX ELECTIVE ABORTION  2009    Vaccum Aspiration without complications          Family Medical History:     Problem Relation (Age of Onset)    Arthritis-rheumatoid Paternal Grandmother    Cirrhosis Father    Diabetes Mother, Maternal Aunt    Heart Attack Father    Heart Disease Father (50)    Hypertension (High Blood Pressure) Mother    Liver Disease Father    Ovarian Cancer Maternal Grandmother (35)    Psoriasis Maternal Uncle    Stroke Maternal Grandfather    Sudden Death no cause Father            Social History     Socioeconomic History   . Marital status: Married     Spouse name: Not on file   . Number of children: 0   . Years of education: 55   . Highest education level: Not on file   Occupational History   . Occupation: Horticulturist, commercial: [employer]   Marland Kitchen Occupation: Health and safety inspector      Comment: Psych    Tobacco Use   . Smoking status: Former Smoker     Packs/day: 0.25     Years: 3.00     Pack years: 0.75     Last attempt to quit: 08/20/2006     Years since quitting: 12.0   . Smokeless tobacco: Never Used   Substance and Sexual Activity   . Alcohol use: Yes     Alcohol/week: 0.0 - 2.0 standard drinks     Frequency: 2-4 times a month   . Drug  use: No   . Sexual activity: Yes     Partners: Male     Birth control/protection: Condom     Comment:  Married 06/2015   Other Topics Concern       Review of Systems    Constitutional: some wt gain  Eyes:  positive for contacts/glasses  Ears, nose, mouth, throat, and face: negative  Respiratory: negative  Cardiovascular: positive for chest pain and palpitations  Gastrointestinal: negative  Genitourinary:negative  Integument/breast: negative psoriasis  Hematologic/lymphatic: negative  Musculoskeletal:positive for arthralgias, stiff joints and back pain  Neurological: see HPI  Behavioral/Psych: negative  Endocrine: negative  Allergic/Immunologic: seasonal allergies      Examination:    Vitals: BP (!) 114/59   Pulse 91   Temp 36.3 C (97.4 F) (Temporal)   Ht 1.575 m (5\' 2" )   Wt 62.9 kg (138 lb 10.7 oz)   LMP 08/19/2018   SpO2 98%   BMI 25.36 kg/m       General: appears in good health  Ophthalmoscopic: normal w/o hemorrhages, exudates, or papilledema  Carotids:Carotids normal without bruit  Orientation: Alert and oriented x 3  Memory: Registration, Recall, and Following of commands is normal  Attention: Attention and Concentration are normal  Knowledge: Good  Language: Normal  Speech: Normal  Cranial nerves: Cranial nerves 2-12 are normal  Gait:: Normal  Coordination: Coordination is normal without tremor  Sensory: Slight decrease  Muscle tone: Upper and lower muscle tone is normal  Motor strength:  Motor strength is normal throughout.  Reflexes: Reflexes are 2/2 throughout    I have reviewed the following: images and Internal records    Assessment and Plan    1. Psoriasis    2. Paresthesias/numbness    3. Low back pain    4. Right leg pain    5. Paresthesia        EMG  RTC after EMG    Adelene Idler, MD 09/11/2018, 09:44

## 2018-09-21 ENCOUNTER — Telehealth (INDEPENDENT_AMBULATORY_CARE_PROVIDER_SITE_OTHER): Payer: Self-pay | Admitting: Nurse Practitioner

## 2018-09-21 NOTE — Telephone Encounter (Signed)
Called patient to discuss results of sleep study. No answer; left voicemail requesting return call. Will also try calling at a later time.     Larey Days, APRN,NP-C  09/21/2018, 08:17

## 2018-09-23 ENCOUNTER — Other Ambulatory Visit (INDEPENDENT_AMBULATORY_CARE_PROVIDER_SITE_OTHER): Payer: Self-pay | Admitting: Pharmacist

## 2018-09-23 ENCOUNTER — Telehealth (HOSPITAL_BASED_OUTPATIENT_CLINIC_OR_DEPARTMENT_OTHER): Payer: Self-pay | Admitting: Nurse Practitioner

## 2018-09-23 DIAGNOSIS — E663 Overweight: Secondary | ICD-10-CM

## 2018-09-23 NOTE — Telephone Encounter (Signed)
Attempted to contact patient re: Stelara follow-up (09/23/18 @ 2:54PM); left voicemail.    Tana Conch, PharmD, BCPS, CSP 09/23/2018, 14:56

## 2018-09-23 NOTE — Telephone Encounter (Signed)
Called results of sleep study. She voiced understanding and was on the fence about starting CPAP and wishes to think over her options given she is a mild case. She requests to start with a referral to a nutritionist for additional support on building a weight loss plan. Referral placed. She states she will be in contact with sleep lab if she wishes to start CPAP. I encouraged her to reach out if she had any further questions.     Larey Days, APRN,NP-C  09/23/2018, 16:43

## 2018-09-23 NOTE — Telephone Encounter (Signed)
Specialty Pharmacy Inflammatory Pharmacist Assessment    Treatment Regimen:  Stelara 45mg  SYRINGE - 1 syringe SubQ Q12Weeks  Diagnosis:  Psoriasis, spondyloarthropathy  Treatment Start Date:  Per patient, has been on Stelara since around 2015-2016; however, restarted therapy 04/22/17.    Patient returned my call re: Stelara follow-up (09/23/18 @ 3:22PM).  Noted last Stelara injection was due on 07/11/18; however, patient delayed picking up refill until 07/13/18.  During today's follow-up, patient reported she was waiting on her TB result from Dermatology before completing last injection; thus, last Stelara injection was administered per patient on 07/28/18 (2.5 weeks late).  Discussed that her previous TB test from 10/30/17 was negative and she was continuing Stelara therapy; thus, no need to hold Stelara dose until updated negative TB result unless she was potentially exposed to TB.  Noted the TB test patient is referring to was obtained on 07/15/18 with result available on 07/17/18.  Patient utilizing calendar to assist with adherence.  Discussed next dose will now be due on 10/20/18.  Advised patient we can go ahead and schedule pick-up of refill or we can wait until closer to due date; patient chose to wait until closer to next planned injection date.    Patient mentioned she is now administering injections herself at home; noted patient previously had a nurse friend administer injections for her.  Re: potential side effects, patient continues to experience "nausea that comes and goes"; however, no vomiting issues.  Patient also reported "some dizziness", but recently discussed with Neurologist per patient.  Noted patient previously was concerned re: hair loss; however, stated this is now starting to resolve and patient feels issue was likely related to stress.  Patient reported benefit with Stelara and reported her psoriasis is clear; however, patient stated she had a couple of "bumps" develop on her scalp recently (but  have now resolved) as well as joint pain in ankles and knees.  Patient rated current symptoms as a 4 on a scale of 1-10, with 1 being mild and 10 being severe.    Obtained updated medication list.  Patient currently taking:  ibuprofen PRN, multivitamin, vitamin D.  No significant drug-drug interactions with Stelara expected.  Addressed patient questions re: "highest dose of Stelara available" via syringe and weight based dosing for psoriasis.  Patient had no additional questions at this time.  Advised patient to contact [facility] [phone]) with future questions or concerns.  Patient expressed appreciation of follow-up.    Tana Conch, PharmD, BCPS, CSP 09/23/2018, 16:38

## 2018-09-27 ENCOUNTER — Encounter (HOSPITAL_COMMUNITY): Payer: Self-pay | Admitting: Cardiovascular Disease

## 2018-09-30 ENCOUNTER — Encounter (HOSPITAL_BASED_OUTPATIENT_CLINIC_OR_DEPARTMENT_OTHER): Payer: Self-pay | Admitting: Nurse Practitioner

## 2018-09-30 NOTE — Progress Notes (Signed)
Tech sent script to DME [facility] for supplies and NEW PATIENT SETUP    AUTO CPAP MIN 5/MAX 20 cmH20  Heated humidification  FIT FOR COMFORT WITH MASK  Headgear  Filters  Tubing  Water chamber.... 09/30/2018 TM'S RRT RPSGT

## 2018-10-12 ENCOUNTER — Ambulatory Visit: Payer: 59 | Attending: Registered"

## 2018-10-12 DIAGNOSIS — Z6824 Body mass index (BMI) 24.0-24.9, adult: Secondary | ICD-10-CM | POA: Insufficient documentation

## 2018-10-12 DIAGNOSIS — E663 Overweight: Secondary | ICD-10-CM

## 2018-10-12 DIAGNOSIS — Z713 Dietary counseling and surveillance: Secondary | ICD-10-CM | POA: Insufficient documentation

## 2018-10-12 MED FILL — Stelara 45 Mg/0.5ml Pfs: 84 days supply | Qty: 1 | Fill #2 | Status: AC

## 2018-10-12 NOTE — Patient Instructions (Addendum)
Medical Nutrition Therapy    1)  Simpler, convenient meal ideas:   -- soups/stews - lentil soup, vegetarian chili, etc     - pair with sourdough bread, simple green salad    - can freeze leftovers   -- toss roast or chicken in the instant pot, steam frozen, vegetables    -- grain, bean, vegetable salads    2)  Boost you smoothie - add ground flax or chia seeds, greens like spinach and kale    3)  Check out "Mealime" app for meal planning    4)  Breakfast ideas:   Plain [place] yogurt with berries and nuts/flax/chia/granola   Avocado on sourdough or whole grain   Steel cut oatmeal with fruit and nuts    5)  Have back up options available such as healthier frozen entrees    6)  Cut back on honey in drinks - it adds up    7)  Follow-up in 6 weeks    Kathlee Nations, RD  10/12/2018, 16:39

## 2018-10-12 NOTE — Progress Notes (Signed)
Medical Nutrition Therapy  Joyce Hunt  10/12/2018    I had the pleasure of meeting 38 y.o. Joyce Hunt for nutrition counseling.  The reason for Hunt's visit was weight management.   Joyce Hunt endorses significantly decreased physical activity and poor Joyce choices since moving to [facility] from [place] three Hunt ago.  She reports eating out 3-4 days per week or more, noting she cooked almost all her meals when living in [place].  Additionally, Joyce Hunt has sustained injuries to her knee and leg, which impacted her ability to exercise.  Since weight gain, Joyce Hunt's sleep has been negatively impacted and she was recently diagnosed with mild sleep apnea.   Joyce Hunt reports additional barriers including stress caring for ill mother in law, busy work and school schedule, and poor sleep Joyce other factors affecting lifestyle and weight gain.  She expresses hopefulness that weight loss will help with sleep apnea.  Joyce Hunt reports UBW 110-115.      Ht 1.575 m (5\' 2" )   Wt 61.8 kg (136 lb 3.9 oz)   BMI 24.92 kg/m     IBW:50 kg %IBW:123% AdjBW: --    A 24 hour Joyce Hunt/Joyce frequency was performed and the results are Joyce follows:  Breakfast- boiled eggs, sourdough toast or blueberry English muffin, coffee with agave/honey and almond milk  Lunch- smoothie/shake - almond milk, berries, vegan protein  Snack- dark chocolate or ice cream bar  Dinner- takeout; cook at home - fish, vegetables, rice  Snacks- Oreo thins, almond milk  Bev- 2 tea with honey, 3 cups coffee, maybe lemon water    Joyce Hunt typically engages in 90+ minutes of physical activity per week.    Estimated calorie needs: 1500-1600 kcal (24-26  kcal/kg BW)     Recommendations:  1)  Simpler, convenient meal ideas:   -- soups/stews - lentil soup, vegetarian chili, etc     - pair with sourdough bread, simple green salad    - can freeze leftovers   -- toss roast or chicken in the instant pot, steam frozen, vegetables    -- grain, bean, vegetable salads    2)   Boost you smoothie - add ground flax or chia seeds, greens like spinach and kale    3)  Check out "Mealime" app for meal planning    4)  Breakfast ideas:   Plain [place] yogurt with berries and nuts/flax/chia/granola   Avocado on sourdough or whole grain   Steel cut oatmeal with fruit and nuts    5)  Have back up options available such Joyce healthier frozen entrees    6)  Cut back on honey in drinks - it adds up      Nutrition Diagnosis: Excessive energy intake Related to undesirable Joyce choices, lack of adequate physical activity Joyce Hunt, Joyce Hunt    Time spent with patient: 60 minutes    I will be happy to follow-up with this patient in 8 weeks.    Thank you for the referral.     Kathlee Nations MS, RD, LD  Outpatient Dietitian  [place], [facility]

## 2018-10-14 ENCOUNTER — Encounter (HOSPITAL_BASED_OUTPATIENT_CLINIC_OR_DEPARTMENT_OTHER): Payer: Self-pay

## 2018-10-14 NOTE — Progress Notes (Signed)
Per fax from [facility], this patient was set up for her PAP equipment on 10/07/2018.

## 2018-10-21 ENCOUNTER — Ambulatory Visit
Admission: RE | Admit: 2018-10-21 | Discharge: 2018-10-21 | Disposition: A | Discharge status: Home / Self Care | Payer: 59 | Source: Ambulatory Visit | Attending: Neurology | Admitting: Neurology

## 2018-10-21 ENCOUNTER — Other Ambulatory Visit: Payer: Self-pay

## 2018-10-21 DIAGNOSIS — M545 Low back pain, unspecified: Secondary | ICD-10-CM

## 2018-10-21 DIAGNOSIS — M79604 Pain in right leg: Secondary | ICD-10-CM | POA: Insufficient documentation

## 2018-10-21 DIAGNOSIS — L409 Psoriasis, unspecified: Secondary | ICD-10-CM | POA: Insufficient documentation

## 2018-10-21 DIAGNOSIS — R202 Paresthesia of skin: Secondary | ICD-10-CM | POA: Insufficient documentation

## 2018-10-23 ENCOUNTER — Ambulatory Visit (HOSPITAL_BASED_OUTPATIENT_CLINIC_OR_DEPARTMENT_OTHER): Payer: 59 | Admitting: Obstetrics & Gynecology

## 2018-10-30 ENCOUNTER — Ambulatory Visit (HOSPITAL_BASED_OUTPATIENT_CLINIC_OR_DEPARTMENT_OTHER): Payer: 59 | Admitting: Obstetrics & Gynecology

## 2018-11-02 ENCOUNTER — Encounter (INDEPENDENT_AMBULATORY_CARE_PROVIDER_SITE_OTHER): Payer: Self-pay | Admitting: Neurology

## 2018-11-04 ENCOUNTER — Encounter (INDEPENDENT_AMBULATORY_CARE_PROVIDER_SITE_OTHER): Payer: Self-pay | Admitting: Neurology

## 2018-11-04 ENCOUNTER — Other Ambulatory Visit: Payer: Self-pay

## 2018-11-04 ENCOUNTER — Ambulatory Visit: Payer: 59 | Attending: Neurology | Admitting: Neurology

## 2018-11-04 VITALS — BP 106/48 | HR 86 | Ht 62.0 in | Wt 138.2 lb

## 2018-11-04 DIAGNOSIS — M79604 Pain in right leg: Secondary | ICD-10-CM

## 2018-11-04 DIAGNOSIS — L409 Psoriasis, unspecified: Secondary | ICD-10-CM

## 2018-11-04 DIAGNOSIS — M545 Low back pain, unspecified: Secondary | ICD-10-CM

## 2018-11-04 NOTE — Progress Notes (Signed)
[place] Department of Neurology      Operated by [facility]  Return Patient  Date:  11/04/2018  Age:38 y.o.  Referring Physician:     Edward Qualia, MD  [place], [place] [address]    Subjective:       LBP continues            Current Outpatient Medications   Medication Sig   . atenolol (TENORMIN) 25 mg Oral Tablet Take 0.5 Tabs (12.5 mg total) by mouth Once a day (Patient not taking: Reported on 08/13/2018)   . cholecalciferol, vitamin D3, 1,000 unit Oral Tablet Take 2,000 Units by mouth Once a day   . cyclobenzaprine (FLEXERIL) 5 mg Oral Tablet Take 1 Tab (5 mg total) by mouth Every night as needed for Muscle spasms (Patient not taking: Reported on 08/13/2018)   . Ibuprofen (MOTRIN) 200 mg Oral Tablet Take 600 mg by mouth Four times a day as needed for Pain Needs refills   . lidocaine (LIDODERM) 5 % Adhesive Patch, Medicated by Transdermal route Once a day   . multivitamin Oral Tablet Take 1 Tab by mouth Once a day   . ustekinumab (STELARA) 45 mg/0.5 mL Subcutaneous Syringe 45 mg by Subcutaneous route Every 3 months         Objective:   Vital Signs:  BP (!) 106/48   Pulse 86   Ht 1.575 m (5\' 2" )   Wt 62.7 kg (138 lb 3.7 oz)   SpO2 100%   BMI 25.28 kg/m     General: appears in good health  Ophthalmoscopic: normal w/o hemorrhages, exudates, or papilledema  Carotids:Carotids normal without bruit  Orientation: Alert and oriented x 3  Memory: Registration, Recall, and Following of commands is normal  Attention: Attention and Concentration are normal  Knowledge: Good  Language: Normal  Speech: Normal  Cranial nerves: Cranial nerves 2-12 are normal  Gait:: Normal  Coordination: Coordination is normal without tremor  Sensory: Slight decrease  Muscle tone: Upper and lower muscle tone is normal  Motor strength:  Motor strength is normal throughout.  Reflexes: Reflexes are 2/2 throughout      Data Reviewed:    I have reviewed the  following: internal records    Independent Interpretation:  none    Assessment and Plan:    1. Low back pain    2. Psoriasis    3. Right leg pain      Orders Placed This Encounter   . MRI SPINE LUMBOSACRAL WO CONTRAST       RTC 7 weeks    Adelene Idler, MD 11/04/2018, 15:28

## 2018-11-08 ENCOUNTER — Encounter (INDEPENDENT_AMBULATORY_CARE_PROVIDER_SITE_OTHER): Payer: Self-pay | Admitting: Neurology

## 2018-11-17 ENCOUNTER — Encounter (HOSPITAL_BASED_OUTPATIENT_CLINIC_OR_DEPARTMENT_OTHER): Payer: Self-pay | Admitting: Family Medicine

## 2018-11-18 ENCOUNTER — Telehealth (HOSPITAL_BASED_OUTPATIENT_CLINIC_OR_DEPARTMENT_OTHER): Payer: 59 | Admitting: Family Medicine

## 2018-11-18 ENCOUNTER — Encounter (HOSPITAL_BASED_OUTPATIENT_CLINIC_OR_DEPARTMENT_OTHER): Payer: Self-pay | Admitting: Family Medicine

## 2018-11-18 ENCOUNTER — Other Ambulatory Visit: Payer: Self-pay

## 2018-11-18 DIAGNOSIS — G4733 Obstructive sleep apnea (adult) (pediatric): Secondary | ICD-10-CM

## 2018-11-18 DIAGNOSIS — J310 Chronic rhinitis: Secondary | ICD-10-CM

## 2018-11-18 DIAGNOSIS — Z9989 Dependence on other enabling machines and devices: Secondary | ICD-10-CM

## 2018-11-18 MED ORDER — FLUTICASONE PROPIONATE 50 MCG/ACTUATION NASAL SPRAY,SUSPENSION
1.0000 | Freq: Every day | NASAL | 0 refills | Status: AC
Start: 2018-11-18 — End: 2018-12-18

## 2018-11-18 NOTE — Progress Notes (Addendum)
[facility], [place]  [place] [address]  [facility]  Video Visit     Name: Joyce Hunt  MRN: [id number]    Date: 11/18/2018  Age: 38 y.o.                            Patient's location: [place] [address]   Patient/family aware of provider location: Yes  Patient/family consent for video visit: Yes  Interview and observation performed by: Aileen Pilot, DO    Chief Complaint: No chief complaint on file.    History of Present Illness:  Joyce Hunt is a 38 y.o. female with recent dx of OSA (baseline AHI was 5/hr). She was initiated on CPAP 5-20cmH2O. Endorse waking up feeling more refresh and awake with nights on CPAP. Tolerate nasal mask but does have intermittent episodes of discomfort at bridge of nose. Denies leak She states she wakes up with rhinitis, denies fever. Able to sleep through the night more soundly without awakenings. She states normally getting 6 hrs of sleep per night.     CPAP COMPLIANCE:   30 day - 13% usage, avg 6 hrs, residual AHI 1.4/hr, APAP 5-20cmH2O, 90% pressure is 10cmH2O.     Past Medical History:  She has a past medical history of Abdominal pain (10/22/2015), Abnormal Pap smear of cervix (2012 ?), Arthritis, Back injury, Dysfunctional uterine bleeding (10/30/2015), Elevated liver enzymes (03/2016), Fall (11/2015), History of dizziness, Keratosis pilaris (06/04/2016), Migraine, Migraine with aura, Plaque psoriasis (2015), and Severe dysmenorrhea (09/02/2016). She also has no past medical history of Alzheimer's disease (CMS [place]), Amyotrophic lateral sclerosis (CMS HCC), Anxiety, Asthma, Attention deficit disorder, Autism, Bell's palsy, Cancer (CMS HCC), Claustrophobia, Concussion, Congestive heart failure (CMS HCC), COPD (chronic obstructive pulmonary disease) (CMS HCC), Coronary artery disease, Dementia (CMS HCC), Depression, Diabetes mellitus (CMS [place]), Diabetes mellitus type 1 (CMS [place]), Diabetes mellitus, type 2 (CMS [place]),  Fibromyalgia, History of infection with vancomycin resistant Enterococcus (VRE), HTN (hypertension), MRSA infection, Hyperlipidemia, Hypothyroidism, MDRO (multiple drug resistant organisms) resistance, Multiple sclerosis (CMS HCC), Muscle weakness, Muscular dystrophy (CMS [place]), Myocardial infarction (CMS [place]), Nerve damage, Neuropathy (CMS HCC), Parkinson's disease (CMS [place]), Seizure (CMS [place]), Stroke (CMS [place]), or TIA (transient ischemic attack).    Past Surgical History:  She has a past medical history of Abdominal pain (10/22/2015), Abnormal Pap smear of cervix (2012 ?), Arthritis, Back injury, Dysfunctional uterine bleeding (10/30/2015), Elevated liver enzymes (03/2016), Fall (11/2015), History of dizziness, Keratosis pilaris (06/04/2016), Migraine, Migraine with aura, Plaque psoriasis (2015), and Severe dysmenorrhea (09/02/2016).    Problem List:  She has Plaque psoriasis; Migraine with aura; Arthritis; Keratosis pilaris; Elevated liver enzymes; Severe dysmenorrhea; and History of angina on their problem list.    Medications:  .  atenoloL  .  cholecalciferol (vitamin D3)  .  cyclobenzaprine  .  fluticasone propionate  .  Ibuprofen  .  lidocaine  .  multivitamin  .  ustekinumab     Review of Systems:  As per HPI    Observational Exam:   GEN: NAD, AOx3  HEENT: EOMI      Assessment/Plan:  OSA  -objective compliance not yet at goal, residual AHI at goal. Humidifier setting seems appropriate and unlikely cause for rhinitis, it may be due to nasal mask fitting.   -will continue APAP 5-20cmh2O, will consider adjusting pressure during next visit after we obtain more data time  -will prescribe Flonase  for complaints of rhinitis  -will plan for face to face follow up in 4 weeks, will consider adjusting to new nasal mask.   -no driving when drowsy    Orders Placed This Encounter   . fluticasone propionate (FLONASE) 50 mcg/actuation Nasal Spray, Suspension     Follow Up:  4 weeks  Encounter time for this visit is  48mn.  CAileen Pilot DO

## 2018-11-20 ENCOUNTER — Other Ambulatory Visit: Payer: Self-pay

## 2018-11-23 ENCOUNTER — Encounter (HOSPITAL_BASED_OUTPATIENT_CLINIC_OR_DEPARTMENT_OTHER): Payer: Self-pay

## 2018-11-24 ENCOUNTER — Ambulatory Visit: Payer: 59 | Attending: Registered"

## 2018-11-24 DIAGNOSIS — Z6825 Body mass index (BMI) 25.0-25.9, adult: Secondary | ICD-10-CM | POA: Insufficient documentation

## 2018-11-24 NOTE — Patient Instructions (Signed)
Medical Nutrition Therapy    1)  Pack meals to take to work - prep one day a week - make a plan     2)  Have larger meal at lunch and keep dinner on the lighter side    3)  Continue to work on regular exercise -- work it in whenever you get opportunities    4)  Follow-up in 2 months    Kathlee Nations, [place]  11/24/2018, 17:11

## 2018-11-24 NOTE — Progress Notes (Signed)
Medical Nutrition Therapy  Balinda Heacock  11/24/2018    I followed up with Tonia Brooms today for weight management.  Lilit states she is doing well with a light breakfast and lunch but finds she craves protein and wants to eat larger portions in the evening.  She notes they are eating at home more but things often come up that interfere with plans.  Violet states that she tries to eat toast on those occasions instead of eating fast food or takeout as her husband and mother in law do.  Jaslynne and her husband are looking for a house as they are finding their current living situation, with Malisa's mother in law, is not ideal.  She also notes due to long work hours, finding time to exercise is a struggle.      Ht 1.575 m (5\' 2" )   Wt 62.1 kg (136 lb 14.5 oz)   BMI 25.04 kg/m     Recommendations:  1)  Pack meals to take to work - prep one day a week - make a plan     2)  Have larger meal at lunch and keep dinner on the lighter side    3)  Continue to work on regular exercise -- work it in whenever you get opportunities    I look forward to following up with this patient in 2 months    Time spent with patient:  30 minutes    Kathlee Nations MS RD LD  Outpatient Dietitian  [place], [facility]

## 2018-12-04 ENCOUNTER — Encounter (INDEPENDENT_AMBULATORY_CARE_PROVIDER_SITE_OTHER): Payer: Self-pay | Admitting: Internal Medicine

## 2018-12-04 DIAGNOSIS — M2550 Pain in unspecified joint: Secondary | ICD-10-CM

## 2018-12-04 DIAGNOSIS — M1990 Unspecified osteoarthritis, unspecified site: Secondary | ICD-10-CM

## 2018-12-07 ENCOUNTER — Encounter (HOSPITAL_BASED_OUTPATIENT_CLINIC_OR_DEPARTMENT_OTHER): Payer: Self-pay

## 2018-12-07 NOTE — Progress Notes (Unsigned)
Please fax PT order to [facility] and let patient know both orders were placed, thanks

## 2018-12-07 NOTE — Telephone Encounter (Signed)
-----   Message from Lilli Light, RN sent at 12/07/2018  8:14 AM EST -----  Regarding: FW: Referral Question  Contact: [phone]  Pt requesting rheumatology referral. Please advise, thank you!   ----- Message -----  From: Tonia Brooms  Sent: 12/04/2018   1:41 PM EST  To: , #  Subject: Referral Question                                Hello Dr. Laurance Flatten:    I hope you had a great Thanksgiving! I was wondering if I can get a referral for [facility] and/or to see a Rheumatologist  in regards to my inflammatory symptoms I've been experiencing again. It almost feels like fibromyalgia but I've never been officially diagnosed. The only time that was mentioned was with the last Rheumatologist Dr. Manuella Ghazi. But like I said nothing official to rule that out. Also, with the achieve of my bones I feel the PT/pool therapy I was doing last time was helping a lot with that. Due to covid they are only doing referrals/appointments at [facility]. Please let me know. Thank you so much and I hope you are keeping safe.     -[facility]

## 2018-12-15 ENCOUNTER — Ambulatory Visit (HOSPITAL_BASED_OUTPATIENT_CLINIC_OR_DEPARTMENT_OTHER): Payer: Self-pay | Admitting: Internal Medicine

## 2018-12-16 ENCOUNTER — Encounter (HOSPITAL_BASED_OUTPATIENT_CLINIC_OR_DEPARTMENT_OTHER): Payer: Self-pay | Admitting: Family Medicine

## 2018-12-21 ENCOUNTER — Ambulatory Visit
Admission: RE | Admit: 2018-12-21 | Discharge: 2018-12-21 | Disposition: A | Discharge status: Home / Self Care | Payer: 59 | Source: Ambulatory Visit | Attending: Neurology | Admitting: Neurology

## 2018-12-21 ENCOUNTER — Other Ambulatory Visit: Payer: Self-pay

## 2018-12-21 DIAGNOSIS — L409 Psoriasis, unspecified: Secondary | ICD-10-CM

## 2018-12-21 DIAGNOSIS — M545 Low back pain, unspecified: Secondary | ICD-10-CM

## 2018-12-21 DIAGNOSIS — M79604 Pain in right leg: Secondary | ICD-10-CM

## 2018-12-23 ENCOUNTER — Other Ambulatory Visit: Payer: Self-pay

## 2018-12-23 ENCOUNTER — Ambulatory Visit: Payer: 59 | Attending: Neurology | Admitting: Neurology

## 2018-12-23 ENCOUNTER — Ambulatory Visit (INDEPENDENT_AMBULATORY_CARE_PROVIDER_SITE_OTHER): Payer: Self-pay | Admitting: Neurology

## 2018-12-23 DIAGNOSIS — M545 Low back pain, unspecified: Secondary | ICD-10-CM

## 2018-12-23 DIAGNOSIS — L409 Psoriasis, unspecified: Secondary | ICD-10-CM

## 2018-12-23 NOTE — Telephone Encounter (Signed)
Called to confirm and offer telemed if she is interested vs. Coming into clinic.   [place], [place]  12/23/2018, 08:24

## 2018-12-23 NOTE — Progress Notes (Signed)
TELEMEDICINE DOCUMENTATION:    Patient Location:  MyChart video visit from home address: [address]  [place] [id number]    Patient/family aware of provider location:  yes  Patient/family consent for telemedicine:  yes  Examination observed and performed by:  Joyce Idler, MD                                                        [place] Department of Neurology      Operated by [facility]  Return Patient  Date:  12/23/2018  Age:38 y.o.  Referring Physician:   Edward Qualia, MD  [place],  [place] [address]    Subjective:     Symptoms same      Joyce Hunt  Female, 38 years old.    MRI SPINE LUMBOSACRAL WO CONTRAST performed on 12/21/2018 3:17 PM.    REASON FOR EXAM:  M54.5: Low back pain  L40.9: Psoriasis  M79.604: Right leg pain      TECHNIQUE: Noncontrast MRI of the lumbar spine.    COMPARISON: None available.    FINDINGS:  There is nonspecific diffuse low T1 signal of the marrow which  may relate to anemia. There is no marrow edema. There is straightening of  lumbar lordosis. There is no spondylolisthesis. There is sacralization of  L5 which has enlarged sacral-type transverse processes JR  pseudoarticulating with the remaining sacrum. There is a small disc at  L5-S1. The other intervertebral discs are unremarkable. No spinal canal or  foraminal stenosis is identified. The conus medullaris terminates normally  by the L1 level. The cauda equina layer dependently.    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    Diffusely low T1 signal of the marrow which is nonspecific but could be  seen with anemia.      Current Outpatient Medications   Medication Sig   . atenolol (TENORMIN) 25 mg Oral Tablet Take 0.5 Tabs (12.5 mg total) by mouth Once a day (Patient not taking: Reported on 08/13/2018)   . cholecalciferol, vitamin D3, 1,000 unit Oral Tablet Take 2,000 Units by mouth Once a day   . cyclobenzaprine (FLEXERIL) 5 mg Oral Tablet Take 1 Tab  (5 mg total) by mouth Every night as needed for Muscle spasms (Patient not taking: Reported on 08/13/2018)   . Ibuprofen (MOTRIN) 200 mg Oral Tablet Take 600 mg by mouth Four times a day as needed for Pain Needs refills   . lidocaine (LIDODERM) 5 % Adhesive Patch, Medicated by Transdermal route Once a day   . multivitamin Oral Tablet Take 1 Tab by mouth Once a day   . ustekinumab (STELARA) 45 mg/0.5 mL Subcutaneous Syringe 45 mg by Subcutaneous route Every 3 months         Objective:   Vital Signs:  There were no vitals taken for this visit.  Video no exam        Data Reviewed:    I have reviewed the following: images and internal records    Independent Interpretation:  none    Assessment and Plan:    1. Low back pain    2. Psoriasis      Orders Placed This Encounter   . Cbc/Diff     RTC  6 months    Crosby Oyster, MD 12/23/2018, 13:34            I personally offered the service to the patient, and obtained verbal consent to provide this service.    Crosby Oyster, MD

## 2018-12-25 ENCOUNTER — Other Ambulatory Visit: Payer: Self-pay

## 2018-12-25 ENCOUNTER — Encounter (HOSPITAL_BASED_OUTPATIENT_CLINIC_OR_DEPARTMENT_OTHER): Payer: 59 | Admitting: Family Medicine

## 2018-12-30 ENCOUNTER — Encounter (HOSPITAL_BASED_OUTPATIENT_CLINIC_OR_DEPARTMENT_OTHER): Payer: 59 | Admitting: Family Medicine

## 2019-01-04 ENCOUNTER — Encounter (INDEPENDENT_AMBULATORY_CARE_PROVIDER_SITE_OTHER): Payer: Self-pay

## 2019-01-04 ENCOUNTER — Ambulatory Visit (INDEPENDENT_AMBULATORY_CARE_PROVIDER_SITE_OTHER): Payer: 59

## 2019-01-04 ENCOUNTER — Other Ambulatory Visit: Payer: Self-pay

## 2019-01-04 VITALS — BP 111/78 | HR 74 | Temp 97.7°F | Resp 16 | Ht 62.0 in | Wt 147.7 lb

## 2019-01-04 DIAGNOSIS — G43909 Migraine, unspecified, not intractable, without status migrainosus: Secondary | ICD-10-CM

## 2019-01-04 MED ORDER — KETOROLAC 30 MG/ML (1 ML) INJECTION SOLUTION
30.00 mg | Freq: Once | INTRAMUSCULAR | 0 refills | Status: AC
Start: 2019-01-04 — End: 2019-01-04

## 2019-01-04 MED ORDER — CYCLOBENZAPRINE 5 MG TABLET
5.0000 mg | ORAL_TABLET | Freq: Every evening | ORAL | 0 refills | Status: DC | PRN
Start: 2019-01-04 — End: 2019-06-14

## 2019-01-04 NOTE — Nursing Note (Signed)
01/04/19 2000   Nursing Procedures / Med Admin   Medications N-Z Toradol  30mg /56ml   Medication Dose 30mg /30ml   Route of Administration IM   Time of Administration 2015   [facility] # [id number]   LOT # [id number]   Expiration date 01/07/20   Manufacturer Blessing Yes   Comments: patient tolerated well   Initials kb

## 2019-01-04 NOTE — Progress Notes (Signed)
History of Present Illness: Joyce Hunt is a 38 y.o. female who [facility] today with chief complaint of    Chief Complaint            Migraine pressure in back of head x 1 1/2 months        Pt [facility] with c/o pain at the base of her head onset over 1 month ago. She reports that she has a Hx of frontal migraines but not at this location of her head. Pt sts that her headache was intermittent at first but it has been daily over the last 1 month. She reports that her sxs are worse in the morning when she wakes up. Pt sts that she was able to feel her pain shift from the back to the front. She reports that she attempted eating something and drinking fluids but they have not provided symptomatic relief. Pt sts that she applied a warm compress to her neck but it exacerbated her sxs. She reports that she has not had any recent changes in her sleep patterns. Pt sts that she has had increased skin sensitivity over the area that her pain is present. She reports that she is currently feeling sensations of dizziness. Pt sts that she has taken ibuprofen but it did not provide symptomatic relief. She associates headache, neck stiffness, intermittent photophobia, intermittent dizziness, and intermittent nausea. Pt denies numbness and paresthesia in hands.    Functional Health Screening:   Patient is under 18: No  Have you had a recent unexplained weight loss or gain?: Yes  Because we are aware of abuse and domestic violence today, we ask all patients: Are you being hurt, hit, or frightened by anyone at your home or in your life?: No  Do you have any basic needs within your home that are not being met? (such as Food, Shelter, Games developer, Transportation): No  Patient is under 18 and therefore has no Advance Directives: No  Patient has: No Advance  Patient has Advance Directive: No  Patient offered: Refused Packet  Screening unable to be completed: No       I reviewed and  confirmed the patient's past medical history taken by the nurse or medical assistant with the addition of the following:    Past Medical History:    Past Medical History:   Diagnosis Date   . Abdominal pain 10/22/2015    ED Visit > USG showed ovaries with small cysts bilaterally only   . Arthritis     Ruled out RA   Rheumatologist Dr. Manuella Ghazi 05/2016    . Back injury    . Dysfunctional uterine bleeding 10/30/2015   . Elevated liver enzymes 03/2016    Sees Dr. Manuella Ghazi Rheumatology    . Fall 11/2015    steps in home fall in [place] > X-Ray without fracture > MRI 12/2015 - Negative for fracture; saw slight abnormality iin left femoral neck/socket area    . History of dizziness    . Keratosis pilaris 06/04/2016   . Migraine    . Migraine with aura     Ocular Migraine reported 07/2016 > Motrin 400 mg po relief.    . Plaque psoriasis 2015    Stelara RX > stopped 03/2016  OTC Vitamin D3  Sees Dr. Edward Qualia PCP for care   . Severe dysmenorrhea 09/02/2016     Past Surgical History:    Past Surgical History:   Procedure Laterality Date   .  HX COLPOSCOPY  2012 ?     [place]  ? results    . HX ELECTIVE ABORTION  2009    Vaccum Aspiration without complications      Allergies:  Allergies   Allergen Reactions   . Mushrooms [Mushroom Flavor] Itching and Swelling     Medications:    Current Outpatient Medications   Medication Sig   . atenolol (TENORMIN) 25 mg Oral Tablet Take 0.5 Tabs (12.5 mg total) by mouth Once a day (Patient not taking: Reported on 08/13/2018)   . cholecalciferol, vitamin D3, 1,000 unit Oral Tablet Take 2,000 Units by mouth Once a day   . cyclobenzaprine (FLEXERIL) 5 mg Oral Tablet Take 1 Tab (5 mg total) by mouth Every night as needed for Muscle spasms   . Ibuprofen (MOTRIN) 200 mg Oral Tablet Take 600 mg by mouth Four times a day as needed for Pain Needs refills   . lidocaine (LIDODERM) 5 % Adhesive Patch, Medicated by Transdermal route Once a day   . multivitamin Oral Tablet Take 1 Tab by mouth Once a day   .  ustekinumab (STELARA) 45 mg/0.5 mL Subcutaneous Syringe 45 mg by Subcutaneous route Every 3 months     Social History:    Social History     Tobacco Use   . Smoking status: Former Smoker     Packs/day: 0.25     Years: 3.00     Pack years: 0.75     Quit date: 08/20/2006     Years since quitting: 12.4   . Smokeless tobacco: Never Used   Substance Use Topics   . Alcohol use: Yes     Alcohol/week: 0.0 - 2.0 standard drinks     Frequency: 2-4 times a month   . Drug use: No     Family History: No significant family history.  Family Medical History:     Problem Relation (Age of Onset)    Arthritis-rheumatoid Paternal Grandmother    Cirrhosis Father    Diabetes Mother, Maternal Aunt    Heart Attack Father    Heart Disease Father (20)    Hypertension (High Blood Pressure) Mother    Liver Disease Father    Ovarian Cancer Maternal Grandmother (19)    Psoriasis Maternal Uncle    Stroke Maternal Grandfather    Sudden Death no cause Father        Review of Systems:    Eyes: Photophobia  Gastrointestinal: Nausea  Musculoskeletal: Pain at the base of her head and neck stiffness  Neurologic: Headache, dizziness, and no numbness or paresthesia in hands  Skin: Skin sensitivity  All other review of systems were negative    Physical Exam:  Vital signs:   Vitals:    01/04/19 1935   BP: 111/78   Pulse: 74   Resp: 16   Temp: 36.5 C (97.7 F)   TempSrc: Tympanic   SpO2: 98%   Weight: 67 kg (147 lb 11.3 oz)   Height: 1.575 m (5' 2" )   BMI: 27.07     Body mass index is 27.02 kg/m. Facility age limit for growth percentiles is 20 years.  Patient's last menstrual period was 12/21/2018 (approximate).    General:  Well appearing and No acute distress  Eyes:  Normal lids/lashes, normal conjunctiva, PERRL, EOMI, intermittent horizontal nystagmus  Pulmonary:  clear to auscultation bilaterally, no wheezes, no rales and no rhonchi  Cardiovascular:  regular rate/rhythm, normal S1/S2, no murmur/rub/gallop  Skin:  warm/dry and no rash,  no facial  asymmetry  Musculoskeletal: Normal strength and tone, 5/5 strength in upper extremities  Psychiatric:  Appropriate affect and behavior and Normal speech  Neurologic:   Alert and oriented x 3, cranial nerves 2-12 intact    Data Reviewed:    Not applicable    Course: Condition at discharge: Good     Differential Diagnosis: tension headache vs ocular migraine vs cluster headache    Assessment:   1. Migraine headache        Plan:    Orders Placed This Encounter   . cyclobenzaprine (FLEXERIL) 5 mg Oral Tablet   . ketorolac (TORADOL) 30 mg/mL (1 mL) Injection Solution     Administered Toradol in clinic.  Take Flexeril as prescribed. Advised to take before falling asleep. Avoid operating heavy machinery, driving, and consuming alcohol while taking.  Recommended follow up with her neurologist, Dr. Hardie Shackleton. Recommended she use MyChart to send message. She may be a candidate for botox.    Go to Emergency Department immediately for further work up if any concerning symptoms.  Plan was discussed and patient verbalized understanding. If symptoms are worsening or not improving the patient should return to the [facility] for further evaluation.    I am scribing for, and in the presence of, Dr. Ollen Gross for services provided on 01/04/2019.  Celene Squibb, SCRIBE   [place], [place] 01/15/2019, 19:41    I personally performed the services described in this documentation, as scribed  in my presence, and it is both accurate  and complete.    Roney Jaffe, MD

## 2019-01-17 ENCOUNTER — Ambulatory Visit (HOSPITAL_COMMUNITY): Payer: Self-pay | Admitting: Internal Medicine

## 2019-01-17 DIAGNOSIS — Z20828 Contact with and (suspected) exposure to other viral communicable diseases: Secondary | ICD-10-CM

## 2019-01-17 NOTE — Progress Notes (Addendum)
COVID-19 SCREENING NOTE    Primary Care Provider: Flint Melter, MD     Has the patient had recent travel to/from or exposure to someone with recent travel to a COVID-19 "hot spot"? No travel, no exposure  Has the patient had recent exposure to a known case of COVID-19: Yes  Has the patient had fever? No  Has the patient had cough? Yes  Has the patient had shortness of breath? No  Additional symptoms? +headache - chills and muscle pain    Patient Active Problem List   Diagnosis   . Plaque psoriasis   . Migraine with aura   . Arthritis   . Keratosis pilaris   . Elevated liver enzymes   . Severe dysmenorrhea   . History of angina         Occupation: Data Unavailable    Disposition:   SCREENING INDICATED.  Order to be placed. SCREENING SITE: [facility].  Patient phone number and address confirmed: [phone], [address]  [address]  [place] [address].      Verbal consent obtained from the patient to be contacted with result.   Employee entered on Employee Health Log. Employee Verbal Consent obtained. Advised employee to follow up with her rheumatologist regarding specific medication questions and covid testing.     Lavella Lemons, RN

## 2019-01-18 ENCOUNTER — Ambulatory Visit: Payer: 59 | Attending: PREVENTIVE MEDICINE-OCCUPATIONAL MEDICINE

## 2019-01-18 DIAGNOSIS — Z20828 Contact with and (suspected) exposure to other viral communicable diseases: Secondary | ICD-10-CM

## 2019-01-18 DIAGNOSIS — U071 COVID-19: Secondary | ICD-10-CM | POA: Insufficient documentation

## 2019-01-19 LAB — COVID-19 ~~LOC~~ MOLECULAR LAB TESTING: 2019-nCoV/SARS-CoV-2: DETECTED — AB

## 2019-01-28 ENCOUNTER — Encounter (INDEPENDENT_AMBULATORY_CARE_PROVIDER_SITE_OTHER): Payer: Self-pay | Admitting: Internal Medicine

## 2019-01-28 DIAGNOSIS — U071 COVID-19: Secondary | ICD-10-CM

## 2019-01-28 DIAGNOSIS — R519 Headache, unspecified: Secondary | ICD-10-CM

## 2019-01-29 ENCOUNTER — Ambulatory Visit (INDEPENDENT_AMBULATORY_CARE_PROVIDER_SITE_OTHER): Payer: 59 | Admitting: Internal Medicine

## 2019-01-29 MED FILL — Stelara 45 Mg/0.5ml Pfs: 84 days supply | Qty: 1 | Fill #0 | Status: AC

## 2019-01-29 NOTE — Telephone Encounter (Signed)
-----   Message from Isabell Jarvis, RN sent at 01/29/2019  8:35 AM EST -----  Regarding: FW: Visit Follow-Up Question  Contact: [phone]    ----- Message -----  From: Joyce Hunt  Sent: 01/28/2019   4:52 PM EST  To: , #  Subject: Visit Follow-Up Question                         Hello Dr. Christell Constant:    So my husband and I have been tested positive for Covid. My positive date was on the 11th of January. I've been still having excruciating headaches which leads me to take IB whenever it occurs which could also be masking my fever symptoms. Lastnight I woke up with the headache again and had to take IBprofen again. Also, not being able to eat due to feeling nausea as well. My dermatologist spoke with me today about holding off on taking my Stelara for another week due to still being symptomatic. I'm supposed to return to work this weekend but I'm still having the headaches and the stomach issues. I wanted to see if you can check to make sure this doesn't turn into anything serious. I still feel it's too soon to return while still having symptoms. Please let me know what we can do to check these headaches and if I would need to do a telemed visit with you. Thank you.    Joyce Hunt

## 2019-01-31 ENCOUNTER — Other Ambulatory Visit: Payer: Self-pay

## 2019-01-31 ENCOUNTER — Encounter (HOSPITAL_COMMUNITY): Payer: Self-pay | Admitting: NURSE PRACTITIONER

## 2019-01-31 ENCOUNTER — Emergency Department (EMERGENCY_DEPARTMENT_HOSPITAL): Payer: 59

## 2019-01-31 ENCOUNTER — Emergency Department
Admission: EM | Admit: 2019-01-31 | Discharge: 2019-01-31 | Disposition: A | Discharge status: Home / Self Care | Payer: 59 | Attending: Physician Assistant | Admitting: Physician Assistant

## 2019-01-31 DIAGNOSIS — R519 Headache, unspecified: Secondary | ICD-10-CM | POA: Insufficient documentation

## 2019-01-31 DIAGNOSIS — Z8616 Personal history of COVID-19: Secondary | ICD-10-CM | POA: Insufficient documentation

## 2019-01-31 DIAGNOSIS — H539 Unspecified visual disturbance: Secondary | ICD-10-CM

## 2019-01-31 LAB — CBC WITH DIFF
BASOPHIL #: <0.1 10*3/uL (ref <=0.20)
BASOPHIL %: 0 %
EOSINOPHIL #: 0.13 10*3/uL (ref <=0.50)
EOSINOPHIL %: 1 %
HCT: 38.1 % (ref 34.8–46.0)
HGB: 12.9 g/dL (ref 11.5–16.0)
IMMATURE GRANULOCYTE #: 0.1 10*3/uL — ABNORMAL HIGH (ref <0.10)
IMMATURE GRANULOCYTE %: 1 % (ref 0–1)
LYMPHOCYTE #: 4.99 10*3/uL — ABNORMAL HIGH (ref 1.00–4.80)
LYMPHOCYTE %: 39 %
MCH: 29 pg (ref 26.0–32.0)
MCHC: 33.9 g/dL (ref 31.0–35.5)
MCV: 85.6 fL (ref 78.0–100.0)
MONOCYTE #: 1 10*3/uL (ref 0.20–1.10)
MONOCYTE %: 8 %
MPV: 9.5 fL (ref 8.7–12.5)
NEUTROPHIL #: 6.46 10*3/uL (ref 1.50–7.70)
NEUTROPHIL %: 51 %
PLATELETS: 346 10*3/uL (ref 150–400)
RBC: 4.45 10*6/uL (ref 3.85–5.22)
RDW-CV: 11.6 % (ref 11.5–15.5)
WBC: 12.7 10*3/uL — ABNORMAL HIGH (ref 3.7–11.0)

## 2019-01-31 LAB — BASIC METABOLIC PANEL
ANION GAP: 9 mmol/L (ref 4–13)
BUN/CREA RATIO: 16 (ref 6–22)
BUN: 12 mg/dL (ref 8–25)
CALCIUM: 9.7 mg/dL (ref 8.5–10.2)
CHLORIDE: 105 mmol/L (ref 96–111)
CO2 TOTAL: 25 mmol/L (ref 22–32)
CREATININE: 0.76 mg/dL (ref 0.49–1.10)
ESTIMATED GFR: >60 mL/min/{1.73_m2} (ref >60)
GLUCOSE: 104 mg/dL (ref 65–139)
POTASSIUM: 4.2 mmol/L (ref 3.5–5.1)
SODIUM: 139 mmol/L (ref 136–145)

## 2019-01-31 MED ORDER — SODIUM CHLORIDE 0.9 % IV BOLUS
1000.00 mL | INJECTION | Status: AC
Start: 2019-01-31 — End: 2019-01-31
  Administered 2019-01-31: 1000 mL via INTRAVENOUS
  Administered 2019-01-31: 0 mL via INTRAVENOUS

## 2019-01-31 MED ORDER — DIPHENHYDRAMINE 50 MG/ML INJECTION SOLUTION
12.50 mg | INTRAMUSCULAR | Status: AC
Start: 2019-01-31 — End: 2019-01-31
  Administered 2019-01-31: 12.5 mg via INTRAVENOUS
  Filled 2019-01-31: qty 1

## 2019-01-31 MED ORDER — PROCHLORPERAZINE EDISYLATE 10 MG/2 ML (5 MG/ML) INJECTION SOLUTION
10.00 mg | INTRAMUSCULAR | Status: AC
Start: 2019-01-31 — End: 2019-01-31
  Administered 2019-01-31: 10 mg via INTRAVENOUS
  Filled 2019-01-31: qty 2

## 2019-01-31 NOTE — ED Provider Notes (Signed)
Joyce Hunt  [facility]  [place] [address]  Dept: [phone]    01/31/2019            Patient is a 39 y.o. female presenting to the ED with chief complaint of  a headache.  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  Patient states she developed a worsening headache about a month ago that was treated with a course of Flexeril and a single shot of Toradol.  Patient states migraines got better but worsened about 2 weeks ago after being diagnosed with COVID.  Patient states the headache is to the occipital region of her head.  Patient admits to dizziness with standing and occasionally has blurry vision with the dizziness.  Patient states she sees a neurologist for her migraine and has a outpatient CT scheduled but the pain was so severe today she came to the emergency department.  Patient states she took Tylenol prior to her arrival to emergency department which did provide some relief to the headache.  Patient states she does not like taking medications and often does not treat her headaches with pharmaceutical drugs.  Patient denies thunderclap headache, maximum at onset of headache, dysarthria, dysphagia, diplopia, or dysarthria, unilateral numbness and tingling to her extremities.             Past Medical History:   Diagnosis Date   . Abdominal pain 10/22/2015    ED Visit > USG showed ovaries with small cysts bilaterally only   . Arthritis     Ruled out RA   Rheumatologist Dr. Manuella Ghazi 05/2016    . Back injury    . Dysfunctional uterine bleeding 10/30/2015   . Elevated liver enzymes 03/2016    Sees Dr. Manuella Ghazi Rheumatology    . Fall 11/2015    steps in home fall in [place] > X-Ray without fracture > MRI 12/2015 - Negative for fracture; saw slight abnormality iin left femoral neck/socket area    . History of dizziness    . Keratosis pilaris 06/04/2016   . Migraine    . Migraine with aura     Ocular  Migraine reported 07/2016 > Motrin 400 mg po relief.    . Plaque psoriasis 2015    Stelara RX > stopped 03/2016  OTC Vitamin D3  Sees Dr. Edward Qualia PCP for care   . Severe dysmenorrhea 09/02/2016     Past Surgical History:   Procedure Laterality Date   . Hx colposcopy  2012 ?   Marland Kitchen Hx elective abortion  2009     Allergies   Allergen Reactions   . Mushrooms [Mushroom Flavor] Itching and Swelling      Current Outpatient Medications   Medication Sig   . atenolol (TENORMIN) 25 mg Oral Tablet Take 0.5 Tabs (12.5 mg total) by mouth Once a day (Patient not taking: Reported on 08/13/2018)   . cholecalciferol, vitamin D3, 1,000 unit Oral Tablet Take 2,000 Units by mouth Once a day   . cyclobenzaprine (FLEXERIL) 5 mg Oral Tablet Take 1 Tab (5 mg total) by mouth Every night as needed for Muscle spasms   . Ibuprofen (MOTRIN) 200 mg Oral Tablet Take 600 mg by mouth Four times a day as needed for Pain Needs refills   . lidocaine (LIDODERM) 5 % Adhesive Patch, Medicated by Transdermal route Once a day   . multivitamin Oral Tablet Take 1  Tab by mouth Once a day   . ustekinumab (STELARA) 45 mg/0.5 mL Subcutaneous Syringe 45 mg by Subcutaneous route Every 3 months      Above history reviewed with patient.  Previous records also reviewed.     Review of Systems   Constitutional: Negative for chills, diaphoresis, fatigue and fever.   HENT: Negative for congestion, drooling, ear pain, facial swelling, sinus pain, sore throat and trouble swallowing.    Eyes: Negative for pain and visual disturbance.        Blurry vision with standing   Respiratory: Negative for chest tightness, shortness of breath, wheezing and stridor.    Cardiovascular: Negative for chest pain, palpitations and leg swelling.   Gastrointestinal: Negative for abdominal distention, abdominal pain, blood in stool, constipation, diarrhea, nausea, rectal pain and vomiting.   Genitourinary: Negative for difficulty urinating, flank pain, pelvic pain, urgency, vaginal bleeding and  vaginal discharge.   Musculoskeletal: Negative for back pain, myalgias, neck pain and neck stiffness.   Skin: Negative for color change and rash.   Neurological: Positive for dizziness, light-headedness and headaches. Negative for seizures, syncope, weakness and numbness.   Psychiatric/Behavioral: Negative for confusion.   All other systems reviewed and are negative.      All other systems reviewed and are negative, unless commented on in the HPI.     Filed Vitals:    01/31/19 2149   BP: 102/63   Pulse: 79   Resp: 17   Temp: 36.9 C (98.5 F)   SpO2: 99%       Physical Exam   Constitutional: She is oriented to person, place, and time and well-developed, well-nourished, and in no distress.   HENT:   Head: Normocephalic.   Eyes: Pupils are equal, round, and reactive to light. Conjunctivae and EOM are normal. Right eye exhibits no chemosis and no discharge. Left eye exhibits no chemosis and no discharge.   Neck: Normal range of motion. Neck supple. No tracheal deviation present.   Cardiovascular: Normal rate, regular rhythm, normal heart sounds and intact distal pulses. Exam reveals no gallop and no friction rub.   No murmur heard.  Pulmonary/Chest: Effort normal and breath sounds normal. No stridor. No respiratory distress. She has no wheezes. She has no rales. She exhibits no tenderness.   Abdominal: Soft. Bowel sounds are normal. She exhibits no distension and no mass. There is no abdominal tenderness. There is no rebound and no guarding.   Musculoskeletal: Normal range of motion.         General: No tenderness, deformity or edema.   Neurological: She is alert and oriented to person, place, and time. She has normal reflexes. No cranial nerve deficit. She exhibits normal muscle tone. Gait normal. Coordination normal. GCS score is 15.   Normal neurological exam, normal finger-to-nose, normal gait, normal heel-to-shin   Skin: Skin is warm and dry. No rash noted. No erythema. No pallor.   Psychiatric: Mood, memory,  affect and judgment normal.          Workup:     Results for orders placed or performed during the hospital encounter of 01/31/19 (from the past 24 hour(s))   CBC/DIFF    Narrative    The following orders were created for panel order CBC/DIFF.  Procedure                               Abnormality  Status                     ---------                               -----------         ------                     CBC WITH VHQI[[id number]]                Abnormal            Final result                 Please view results for these tests on the individual orders.   BASIC METABOLIC PANEL   Result Value Ref Range    SODIUM 139 136 - 145 mmol/L    POTASSIUM 4.2 3.5 - 5.1 mmol/L    CHLORIDE 105 96 - 111 mmol/L    CO2 TOTAL 25 22 - 32 mmol/L    ANION GAP 9 4 - 13 mmol/L    CALCIUM 9.7 8.5 - 10.2 mg/dL    GLUCOSE 104 65 - 139 mg/dL    BUN 12 8 - 25 mg/dL    CREATININE 0.76 0.49 - 1.10 mg/dL    BUN/CREA RATIO 16 6 - 22    ESTIMATED GFR >60 >60 mL/min/1.18m2    Narrative    Estimated Glomerular Filtration Rate (eGFR) calculated using the CKD-EPI (2009) equation, intended for patients 125years of age and older. If race and/or gender is not documented or "unknown," there will be no eGFR calculation.   CBC WITH DIFF   Result Value Ref Range    WBC 12.7 (H) 3.7 - 11.0 x10^3/uL    RBC 4.45 3.85 - 5.22 x10^6/uL    HGB 12.9 11.5 - 16.0 g/dL    HCT 38.1 34.8 - 46.0 %    MCV 85.6 78.0 - 100.0 fL    MCH 29.0 26.0 - 32.0 pg    MCHC 33.9 31.0 - 35.5 g/dL    RDW-CV 11.6 11.5 - 15.5 %    PLATELETS 346 150 - 400 x10^3/uL    MPV 9.5 8.7 - 12.5 fL    NEUTROPHIL % 51 %    LYMPHOCYTE % 39 %    MONOCYTE % 8 %    EOSINOPHIL % 1 %    BASOPHIL % 0 %    NEUTROPHIL # 6.46 1.50 - 7.70 x10^3/uL    LYMPHOCYTE # 4.99 (H) 1.00 - 4.80 x10^3/uL    MONOCYTE # 1.00 0.20 - 1.10 x10^3/uL    EOSINOPHIL # 0.13 <=0.50 x10^3/uL    BASOPHIL # <0.10 <=0.20 x10^3/uL    IMMATURE GRANULOCYTE % 1 0 - 1 %    IMMATURE GRANULOCYTE # 0.10 (H) <0.10 x10^3/uL       CT BRAIN  WO IV CONTRAST   Preliminary Result by Edi, Radresults In (01/24 2247)   No acute intracranial abnormalities             Orders Placed This Encounter   . CT BRAIN WO IV CONTRAST   . CBC/DIFF   . BASIC METABOLIC PANEL   . CBC WITH DIFF   . NS bolus infusion 1,000 mL   . prochlorperazine (COMPAZINE) 5 mg/mL injection   . diphenhydrAMINE (BENADRYL) 50 mg/mL injection       ECG: See CVIS report  Abnormal Lab results:  Labs Reviewed   CBC WITH DIFF - Abnormal; Notable for the following components:       Result Value    WBC 12.7 (*)     LYMPHOCYTE # 4.99 (*)     IMMATURE GRANULOCYTE # 0.10 (*)     All other components within normal limits   BASIC METABOLIC PANEL - Normal    Narrative:     Estimated Glomerular Filtration Rate (eGFR) calculated using the CKD-EPI (2009) equation, intended for patients 34 years of age and older. If race and/or gender is not documented or "unknown," there will be no eGFR calculation.   CBC/DIFF    Narrative:     The following orders were created for panel order CBC/DIFF.  Procedure                               Abnormality         Status                     ---------                               -----------         ------                     CBC WITH QIWL[[id number]]                Abnormal            Final result                 Please view results for these tests on the individual orders.       ED Course:  Appropriate labs and imaging ordered. Medical Records reviewed.   During the patient's stay in the emergency department, the above listed imaging and/or labs were performed to assist with medical decision making and were reviewed by myself when available for review.          Pt remained stable throughout the emergency department course.    Suspect headache is related to COVID-19 diagnosis  Patient had significant reduction in her headache after administration of medications  Normal head CT  Normal neurological exam  Follow-up neurology as scheduled next week    Impression:    Encounter  Diagnosis   Name Primary?   . Headache Yes      Disposition:  Discharged     Follow Up Plan:  [facility]  [place]  [phone]  Go to   As needed, If symptoms worsen      The co-signing faculty was physically present in [facility] and available for consultation. He/she did not particpate in the care of this patient.      [address] [place], PA-C  01/31/2019, 22:24

## 2019-01-31 NOTE — Discharge Instructions (Signed)
Please return immediately for worsening symptoms or new and worrisome symptoms.  Take Tylenol and Ibuprofen for your headache.  Follow-up as scheduled with Neurology.

## 2019-02-01 ENCOUNTER — Encounter (HOSPITAL_COMMUNITY): Payer: Self-pay

## 2019-02-01 ENCOUNTER — Telehealth: Payer: 59 | Admitting: Internal Medicine

## 2019-02-01 DIAGNOSIS — Z5329 Procedure and treatment not carried out because of patient's decision for other reasons: Secondary | ICD-10-CM

## 2019-02-03 ENCOUNTER — Telehealth (INDEPENDENT_AMBULATORY_CARE_PROVIDER_SITE_OTHER): Payer: 59 | Admitting: Internal Medicine

## 2019-02-03 ENCOUNTER — Other Ambulatory Visit: Payer: Self-pay

## 2019-02-03 ENCOUNTER — Encounter (INDEPENDENT_AMBULATORY_CARE_PROVIDER_SITE_OTHER): Payer: Self-pay | Admitting: Internal Medicine

## 2019-02-03 DIAGNOSIS — U071 COVID-19: Secondary | ICD-10-CM

## 2019-02-03 DIAGNOSIS — G8929 Other chronic pain: Secondary | ICD-10-CM

## 2019-02-03 DIAGNOSIS — R519 Headache, unspecified: Secondary | ICD-10-CM

## 2019-02-03 MED ORDER — RIZATRIPTAN 10 MG TABLET
10.00 mg | ORAL_TABLET | Freq: Once | ORAL | 2 refills | Status: DC | PRN
Start: 2019-02-03 — End: 2019-06-14

## 2019-02-03 NOTE — Progress Notes (Signed)
TELEMEDICINE CONSULTATION     Subjective:   Patient ID:  Joyce Hunt is a pleasant 39 y.o. female.    Chief Complaint: Headache      History of Present Illness:  Tested positive for COVID. Since then has been getting daily severe headaches. Prior history of migraines. She went to ER, had CT brain, no acute process. She has been taking ibuprofen, ER advised to take around the clock, which helps but states headache comes back when medication wears off. She wakes up with them. Trying to get rest, stay hydrated.     TELEMEDICINE DOCUMENTATION:    Patient Location:  MyChart video visit from car    Patient/family aware of provider location:  yes  Patient/family consent for telemedicine:  yes  Examination observed and performed by:  Flint Melter, MD        I reviewed and confirmed the patient's past medical history taken by the nurse or medical assistant with the addition of the following:    Allergies:     Allergies   Allergen Reactions   . Mushrooms [Mushroom Flavor] Itching and Swelling         Medications:     .  atenolol (TENORMIN) 25 mg Oral Tablet, Take 0.5 Tabs (12.5 mg total) by mouth Once a day (Patient not taking: Reported on 08/13/2018)    .  cholecalciferol, vitamin D3, 1,000 unit Oral Tablet, Take 2,000 Units by mouth Once a day    .  cyclobenzaprine (FLEXERIL) 5 mg Oral Tablet, Take 1 Tab (5 mg total) by mouth Every night as needed for Muscle spasms    .  Ibuprofen (MOTRIN) 200 mg Oral Tablet, Take 600 mg by mouth Four times a day as needed for Pain Needs refills    .  lidocaine (LIDODERM) 5 % Adhesive Patch, Medicated, by Transdermal route Once a day    .  multivitamin Oral Tablet, Take 1 Tab by mouth Once a day    .  ustekinumab (STELARA) 45 mg/0.5 mL Subcutaneous Syringe, 45 mg by Subcutaneous route Every 3 months    No facility-administered medications prior to visit.         Immunization History:     Immunization History   Administered Date(s) Administered   . Influenza Vaccine, 6 month-adult  10/12/2014, 10/30/2015, 10/09/2016, 10/10/2017         Past Medical History:     Past Medical History:   Diagnosis Date   . Abdominal pain 10/22/2015    ED Visit > USG showed ovaries with small cysts bilaterally only   . Arthritis     Ruled out RA   Rheumatologist Dr. Sherryll Burger 05/2016    . Back injury    . Dysfunctional uterine bleeding 10/30/2015   . Elevated liver enzymes 03/2016    Sees Dr. Sherryll Burger Rheumatology    . Fall 11/2015    steps in home fall in [place] > X-Ray without fracture > MRI 12/2015 - Negative for fracture; saw slight abnormality iin left femoral neck/socket area    . History of dizziness    . Keratosis pilaris 06/04/2016   . Migraine    . Migraine with aura     Ocular Migraine reported 07/2016 > Motrin 400 mg po relief.    . Plaque psoriasis 2015    Stelara RX > stopped 03/2016  OTC Vitamin D3  Sees Dr. Flint Melter PCP for care   . Severe dysmenorrhea 09/02/2016  Past Surgical History:     Past Surgical History:   Procedure Laterality Date   . HX COLPOSCOPY  2012 ?     [facility]  ? results    . HX ELECTIVE ABORTION  2009    Vaccum Aspiration without complications              Family History:     Family Medical History:     Problem Relation (Age of Onset)    Arthritis-rheumatoid Paternal Grandmother    Cirrhosis Father    Diabetes Mother, Maternal Aunt    Heart Attack Father    Heart Disease Father (18)    Hypertension (High Blood Pressure) Mother    Liver Disease Father    Ovarian Cancer Maternal Grandmother (51)    Psoriasis Maternal Uncle    Stroke Maternal Grandfather    Sudden Death no cause Father                Social History:   Joyce Hunt  reports that she quit smoking about 12 years ago. She has a 0.75 pack-year smoking history. She has never used smokeless tobacco. She reports current alcohol use of about 0 - 2 standard drinks or equivalent per week. She reports being sexually active and has had partner(s) who are Female. She reports using the following method of birth  control/protection: Condom. She reports that she does not use drugs.       Review of Systems:  GI: positive for nausea    Objective:     Observational Exam:  Constitutional: Alert, well developed, well nourished, appears in no acute distress  Head: NC/AT. No observed lesions  Eyes:  Conjunctiva normal. EOM intact, Neck supple. No eye or nasal discharge.   Cardiovascular: No apparent JVD noted  Respiratory:  Effort normal, no accessory muscle usage. No audible wheezing noted. No stridor.   Musculoskeletal:  FROM. No deformities observed.  Neuro: normal mental status, speech normal, alert and oriented x iii,   Skin and Breast: Skin is warm and dry. No rash or erythema noted.   Psychiatric:  Pt has a normal mood and affect, behavior, judgement, and thought content.    Assessment & Plan:     There are no diagnoses linked to this encounter.    ENCOUNTER DIAGNOSES     ICD-10-CM   1. Chronic headaches  R51.9    G89.29   2. COVID-19  U07.1      Trial of triptan for rescue. Advised rest, hydration  Discussed medication overuse headache. Avoid rescue meds including ibuprofen > 2 times weekly.            Health Maintenance   Topic Date Due   . Adult Tdap-Td (1 - Tdap) 10/19/2001   . Depression Screening  11/18/2018   . Pap smear  09/03/2019   . HIV Screening  Completed   . Influenza Vaccine  Completed       No follow-ups on file.    The patient was given ample opportunity to ask questions and those questions were answered to the patient's satisfaction. The patient was encouraged to be involved in their own care, and all diagnoses, medications, and medication side-effects were discussed. The patient was told to contact me with any additional questions or concerns, and to go to the emergency department in an emergency.     The patient has been educated and verbalized understanding regarding the services provided during this visit.      Edward Qualia, MD

## 2019-02-08 NOTE — Progress Notes (Signed)
Left without being seen.

## 2019-03-03 ENCOUNTER — Encounter (INDEPENDENT_AMBULATORY_CARE_PROVIDER_SITE_OTHER): Payer: Self-pay | Admitting: Internal Medicine

## 2019-04-02 ENCOUNTER — Ambulatory Visit (VACCINATION_CLINIC): Payer: 59

## 2019-04-19 MED FILL — Stelara 45 Mg/0.5ml Pfs: 84 days supply | Qty: 1 | Fill #1 | Status: AC

## 2019-04-22 ENCOUNTER — Other Ambulatory Visit (INDEPENDENT_AMBULATORY_CARE_PROVIDER_SITE_OTHER): Payer: Self-pay | Admitting: Pharmacist

## 2019-04-22 NOTE — Telephone Encounter (Signed)
Specialty Pharmacy Follow Up Assessment Note    Treatment Regimen: Stelara 45mg  SYRINGE - 1 syringe SubQ Q12Weeks  Diagnosis: Psoriasis + Spondyloarthropathy  Treatment Start Date: 2015-2016; however, restarted therapy 04/22/17  Prescriber: 04/24/17  Patient: Joyce Hunt is a 39 y.o. female     Called to follow-up and assess for efficacy and safety. Spoke with pt herself today. Patient continues to take every 12 weeks (Fridays).  Patient reported 0 missed doses and declined review of injection technique/device and ADE/warnings today. Scheduled pickup for Friday, 04/30/19 (the date she's due to inject next).    Patient noted the following side effects: None. Denies any HA, injection site-rxn, or upset stomach.     Medication changes since last call include: None. Obtained updated medication list, comorbids, and allergies from patient. Only uses Stelara, a multivitamin, and Vitamin D on a regular basis. IBU used rarely PRN. No significant drug-drug interactions. Epic chart reviewed for medications/allergies, most recent labs, and comorbid conditions.  She received her 1st Pfizer COVID vaccine on 03/30/19, and is scheduled for her 2nd dose on 04/23/19. No concern for interactions or contraindications to treatment.    She endorsed the following symptoms: Claims no flares, but some back pain starting to worsen and still says she has "a little RA" on her hand and foot. She's been doing more stretches and that helps. Her muscles feel a little inflamed, but she feels like that's more of her fibromyalgia. The psoriasis flare on her arm is slowly fading, but her skin feels more itchy than it had been. Overall, she says 04/25/19 is going really well. She said her provider considered changing Stelara to Humira, but she feels like it's working well enough for now and she wants to continue as-is.    Joyce Hunt had the following questions: Can Stelara cause fatigue? Advised that it is a potential side effect, but that fatigue is  also a symptom of her inflammatory arthritis. Patient states she has been very inconsistent with exercise and recently diagnosed with mild sleep apnea. Counseled that if this fatigue continues/worsens to contact her provider. Advised patient to contact [facility] [phone]) with future questions or concerns.  Patient expressed appreciation of information.    ([phone], PHARMD  04/22/2019, 15:55

## 2019-04-23 ENCOUNTER — Ambulatory Visit (VACCINATION_CLINIC): Payer: 59

## 2019-06-10 ENCOUNTER — Encounter (INDEPENDENT_AMBULATORY_CARE_PROVIDER_SITE_OTHER): Payer: Self-pay | Admitting: Internal Medicine

## 2019-06-11 ENCOUNTER — Telehealth (INDEPENDENT_AMBULATORY_CARE_PROVIDER_SITE_OTHER): Payer: Self-pay | Admitting: Internal Medicine

## 2019-06-11 NOTE — Telephone Encounter (Signed)
Department of Community Practice     Attempted to contact patient to complete COVID-19 prescreen prior to scheduled appointment. Unable to reach patient at this time, left message for patient to call back in and complete prescreening prior to appointment. Call back number provided [facility] [phone].    Cory Roughen  06/11/2019, 12:40

## 2019-06-14 ENCOUNTER — Other Ambulatory Visit: Payer: Self-pay

## 2019-06-14 ENCOUNTER — Ambulatory Visit (INDEPENDENT_AMBULATORY_CARE_PROVIDER_SITE_OTHER): Payer: 59 | Admitting: Internal Medicine

## 2019-06-14 ENCOUNTER — Encounter (FREE_STANDING_LABORATORY_FACILITY)
Admit: 2019-06-14 | Discharge: 2019-06-14 | Disposition: A | Discharge status: Home / Self Care | Payer: 59 | Attending: Internal Medicine | Admitting: Internal Medicine

## 2019-06-14 ENCOUNTER — Encounter (FREE_STANDING_LABORATORY_FACILITY): Payer: 59 | Admitting: Internal Medicine

## 2019-06-14 ENCOUNTER — Encounter (INDEPENDENT_AMBULATORY_CARE_PROVIDER_SITE_OTHER): Payer: Self-pay | Admitting: Internal Medicine

## 2019-06-14 VITALS — BP 109/69 | HR 75 | Temp 97.2°F | Resp 18 | Ht 62.0 in | Wt 128.7 lb

## 2019-06-14 DIAGNOSIS — Z23 Encounter for immunization: Secondary | ICD-10-CM

## 2019-06-14 DIAGNOSIS — Z0000 Encounter for general adult medical examination without abnormal findings: Secondary | ICD-10-CM

## 2019-06-14 LAB — GLUCOSE FASTING: GLUCOSE FASTING: 85 mg/dL (ref 70–99)

## 2019-06-14 LAB — LIPID PANEL
CHOL/HDL RATIO: 4.1
CHOLESTEROL: 157 mg/dL (ref 100–200)
HDL CHOL: 38 mg/dL — ABNORMAL LOW (ref >=50)
LDL CALC: 93 mg/dL (ref <100)
NON-HDL: 119 mg/dL (ref <=190)
TRIGLYCERIDES: 129 mg/dL (ref <150)
VLDL CALC: 26 mg/dL (ref <30)

## 2019-06-14 NOTE — Progress Notes (Signed)
Internal Medicine & Pediatrics  PEDIATRICS/INTERNAL MEDICINE, [facility]  [address]  [place] [address]  [facility]       OUTPATIENT PROGRESS NOTE   Well Adult Visit    Name: Joyce Hunt MRN:  [id number]   Date: 06/14/2019 Age: 39 y.o.       HPI:   Joyce Hunt is a pleasant 39 y.o. female who comes to clinic today for a well adult visit.  They have the following concerns today:  Left sided shoulder/back pain, lower back. Had gotten a massage and got very sore afterward. Has a lot of stress,   Update on chronic conditions:       Lifestyle:  Diet:   Good and eats meals earlier in day, taking pre/probiotic   Exercise:  moderately active or walks most days  Substance:  Depression screen:   Caffeine:  consumes caffeine in the form of  Coffee  Sleep:   Adequate sleep sometimes impacted by stress  Med Adherence: The patient reports adherence to this regimen  Wt Readings from Last 5 Encounters:   06/14/19 58.4 kg (128 lb 12 oz)   01/04/19 67 kg (147 lb 11.3 oz)   11/25/18 62.1 kg (136 lb 14.5 oz)   11/04/18 62.7 kg (138 lb 3.7 oz)   10/12/18 61.8 kg (136 lb 3.9 oz)       Review of Systems:   *ALL REVIEW OF SYMPTOMS NEGATIVE UNLESS BOLDED*  General: fevers, chills  Skin:  new or changing moles, sees dermatology regularly  Eyes visual changes, eye pain  ENT: ear pain, nasal congestion, sore throat, oral lesions, swallowing difficulty  Pulmonary: wheezing, shortness of breath, coughing   Breasts: Masses, pain, discharge   Cardiovascular: occasional chest pain which is at her baseline  GI:  Nausea (due to stelara)   GU: Painful urination, regular periods--more fatigue with periods  Immune/heme:  lymph node enlargement   MS: back pain as above  Neuro: headaches,  Psych:  depression, drug or alcohol problems            Past Medical History:     Patient Active Problem List   Diagnosis   . Plaque psoriasis   . Migraine with aura   . Arthritis   . Keratosis pilaris   . Elevated liver enzymes    . Severe dysmenorrhea   . History of angina     Past Medical History:   Diagnosis Date   . Abdominal pain 10/22/2015    ED Visit > USG showed ovaries with small cysts bilaterally only   . Arthritis     Ruled out RA   Rheumatologist Dr. Sherryll Burger 05/2016    . Back injury    . Dysfunctional uterine bleeding 10/30/2015   . Elevated liver enzymes 03/2016    Sees Dr. Sherryll Burger Rheumatology    . Fall 11/2015    steps in home fall in [place] > X-Ray without fracture > MRI 12/2015 - Negative for fracture; saw slight abnormality iin left femoral neck/socket area    . History of dizziness    . Keratosis pilaris 06/04/2016   . Migraine    . Migraine with aura     Ocular Migraine reported 07/2016 > Motrin 400 mg po relief.    . Plaque psoriasis 2015    Stelara RX > stopped 03/2016  OTC Vitamin D3  Sees Dr. Flint Melter PCP for care   . Severe dysmenorrhea 09/02/2016  Past Surgical History:     Past Surgical History:   Procedure Laterality Date   . HX COLPOSCOPY  2012 ?     Miranda  ? results    . HX ELECTIVE ABORTION  2009    Vaccum Aspiration without complications         Medications:     Current Outpatient Medications   Medication Sig   . cholecalciferol, vitamin D3, 1,000 unit Oral Tablet Take 2,000 Units by mouth Once a day   . Ibuprofen (MOTRIN) 200 mg Oral Tablet Take 600 mg by mouth Four times a day as needed for Pain Needs refills   . lidocaine (LIDODERM) 5 % Adhesive Patch, Medicated by Transdermal route Once a day   . multivitamin Oral Tablet Take 1 Tab by mouth Once a day   . ustekinumab (STELARA) 45 mg/0.5 mL Subcutaneous Syringe 45 mg by Subcutaneous route Every 3 months       Allergies:     Allergies   Allergen Reactions   . Mushrooms [Mushroom Flavor] Itching and Swelling         Family History:     Family Medical History:     Problem Relation (Age of Onset)    Arthritis-rheumatoid Paternal Grandmother    Cirrhosis Father    Diabetes Mother, Maternal Aunt    Heart Attack Father    Heart Disease Father (32)     Hypertension (High Blood Pressure) Mother    Liver Disease Father    Ovarian Cancer Maternal Grandmother (65)    Psoriasis Maternal Uncle    Stroke Maternal Grandfather    Sudden Death no cause Father              Social History:     Social History     Tobacco Use   . Smoking status: Former Smoker     Packs/day: 0.25     Years: 3.00     Pack years: 0.75     Quit date: 08/20/2006     Years since quitting: 12.8   . Smokeless tobacco: Never Used   Vaping Use   . Vaping Use: Never used   Substance Use Topics   . Alcohol use: Yes     Alcohol/week: 0.0 - 2.0 standard drinks   . Drug use: No         Physical Exam:   Vitals:  Blood pressure 109/69, pulse 75, temperature 36.2 C (97.2 F), temperature source Thermal Scan, resp. rate 18, height 1.575 m (5\' 2" ), weight 58.4 kg (128 lb 12 oz), last menstrual period 05/31/2019, SpO2 99 %, not currently breastfeeding.   Body mass index is 23.55 kg/m.  General:  Pleasant, well-appearing, no acute distress  Skin:  Warm, pink, dry, no rashes, no abnormal moles  HEENT:  NC/AT, pupils equally round and reactive to light, TM's normal, neck supple, no cervical lymphadenopathy  Heart: regular rate and rhythm, normal S1/S2, no murmurs, rubs, or gallops, no JVD  Lungs: Clear to auscultation bilaterally, equal air entry, no wheezes, rales, or rhonchi  Abdomen: normoactive bowel sounds, non-distended, soft, non-tender no hepatosplenomegaly or masses  Musculoskeletal: no clubbing, cyanosis, or edema,   Neurological: awake, alert, and oriented x3, DTR's 2+           Assessment & Plan:   Joyce Hunt was seen today for annual exam.    Diagnoses and all orders for this visit:    Well adult exam  Healthy 39 y/o  Has appt with gyn.  Pap is uptodate  Tdap given.  Advised heating pad, stretching, OTC analgesics for back pain  Other orders  -     Tdap Vaccine-Boostrix->=10 yrs(Admin)         HCM updated below  Counseling  Medications  AVS          Healthcare Maintenance: (as appropriate based on age/gender)     Mammogram: No results found for this or any previous visit (from the past 99242 hour(s)).  PAP/Pelvic:   DEXA: No results found for this or any previous visit (from the past [id number] hour(s)).    Colonoscopy    PSA:   PROSTATE SPECIFIC ANTIGEN   No results found for: PROSSPECAG   AAA:     Lung Cancer Screening:      Depression  Little interest or pleasure in doing things.: 0  Feeling down, depressed, or hopeless: 0  PHQ 2 Total: 0    Substance Abuse Screening  Alcohol:  Tobacco  Drugs    Other Screenings  HIV  Hep C  HIV  GC/Chlamydia      Lipid screen   Lab Results   Component Value Date    CHOLESTEROL 179 10/30/2017    HDLCHOL 42 (L) 10/30/2017    LDLCHOL 92 10/30/2017    TRIG 227 (H) 10/30/2017      DM screen   Lab Results   Component Value Date    GLUCOSEFAST 83 12/29/2015      Lab Results   Component Value Date    GLUCOSENF 104 01/31/2019    No results found for: HA1C        Eye Exam:   Dental Exam:    Immunizations (as as appropriate)  Flu:   Pneumococcal:   Tdap:   Shingrix:   COVID  HPV:      No follow-ups on file.     The patient has been educated and verbalized understanding regarding the services provided during this visit.  Marshall Cork, MD  Internal Medicine and Pediatrics  Assistant Professor    [facility]  [address]  [place], [place] [address]  [phone]

## 2019-06-14 NOTE — Nurses Notes (Signed)
Department of [employer]     Patient received vaccine in clinic.  Tolerated it well, given VIS sheet and was discharged to home.  Immunization administered     Name Date Dose VIS Date Route    Tetanus Toxoid/Diphtheria Toxoid/Acellular Pertussis Vaccine, Adsorbed (Tdap) 06/14/2019 0.5 mL 04/08/2018 Intramuscular    Site: Right deltoid    Given By: Faythe Casa, MA    Manufacturer: [employer]    Lot: [id number]    NDC: [id number]          Faythe Casa, MA  06/14/2019, 11:13

## 2019-06-14 NOTE — Nurses Notes (Deleted)
Department of Community [facility]     Venipuncture performed in office on left arm antecubital vein, dry pressure dressing was applied to site and patient tolerated it well.  Specimen was centrifuged, aliquoted as needed and specimen was labeled and packaged for transport.    Faythe Casa, MA  06/14/2019, 10:52

## 2019-06-15 ENCOUNTER — Encounter (INDEPENDENT_AMBULATORY_CARE_PROVIDER_SITE_OTHER): Payer: Self-pay | Admitting: Internal Medicine

## 2019-07-16 MED FILL — Stelara 45 Mg/0.5ml Pfs: 84 days supply | Qty: 1 | Fill #2 | Status: AC

## 2019-10-12 MED FILL — Stelara 45 Mg/0.5ml Pfs: 84 days supply | Qty: 1 | Fill #3 | Status: CP

## 2019-10-13 ENCOUNTER — Other Ambulatory Visit (INDEPENDENT_AMBULATORY_CARE_PROVIDER_SITE_OTHER): Payer: Self-pay | Admitting: Pharmacist

## 2019-10-15 NOTE — Telephone Encounter (Signed)
Specialty Pharmacy Therapy Assessment Note    Assessment Completed: 10/13/2019 (delayed documentation)  Treatment Regimen: Stelara 45 mg syringe: inject one syringe SQ every 12 weeks  Indication: psoriasis  Provider: Dr. Christiane Ha  Initiation of Therapy: ~2015  Patient: Joyce Hunt is a 39 y.o. female    Spoke with patient today for semi-annual follow-up and to assess efficacy and safety with Stelara. Patient started Stelara in 2015 and continues to take every 12 weeks. Reports zero syringes left on hand and is due for next injection on 10/12 (appropriate and reflects adherence to regimen). Reports zero missed dosages and uses her phone calendar to keep track. Patient denies the following: current infection, intolerable side effects, injection site reactions, or trouble completing injections. She says she felt a little "feverish" this morning and that her immune system always "shuts down" and she develops nausea before her Stelara dose is due. Advised she not inject on 10/12 if still feeling poorly and to seek medical care if she has a fever or other symptoms of illness. Patient declined review of injection technique/device, storage, side effects, warnings/precautions, drug interactions, handling during times of infection and missed dose handling today. Patient requested supplies (Band-Aid) with next pick-up scheduled for 10/8.    Patient endorses the following symptoms: psoriasis on her forearm, ears and scalp; rates itching 8/10. Denies recent flare ups. She has noticed her hair falling out but it has improved with recent stress management. Explained the plaques on her scalp could be partly to blame, but to contact provider if hair thinning continues or worsens so they can evaluate her or do bloodwork if needed. Her right knee and hips are sore and the AM stiffness improves after stretching. She is trying to get more active again and exercise.     Medication changes since last discussion include: none.  No other updates including allergies/conditions per patient. Reviewed Epic chart for relevant labs necessary for monitoring with Stelara (limited information available as provider is from outside clinic); no significant issues or contraindications identified.    Patient voiced the following questions: none at this time. Advised patient to contact [facility] at [phone] with future questions or concerns.    Ave Filter, PHARMD  10/15/2019, 17:19

## 2019-10-20 ENCOUNTER — Other Ambulatory Visit: Payer: Self-pay

## 2019-11-05 ENCOUNTER — Ambulatory Visit: Payer: 59 | Attending: EXTERNAL

## 2019-11-05 DIAGNOSIS — M545 Low back pain, unspecified: Secondary | ICD-10-CM | POA: Insufficient documentation

## 2019-11-05 DIAGNOSIS — L409 Psoriasis, unspecified: Secondary | ICD-10-CM | POA: Insufficient documentation

## 2019-11-05 DIAGNOSIS — L400 Psoriasis vulgaris: Secondary | ICD-10-CM | POA: Insufficient documentation

## 2019-11-05 LAB — CBC WITH DIFF
BASOPHIL #: <0.1 10*3/uL (ref <=0.20)
BASOPHIL %: 0 %
EOSINOPHIL #: <0.1 10*3/uL (ref <=0.50)
EOSINOPHIL %: 1 %
HCT: 36.7 % (ref 34.8–46.0)
HGB: 12.7 g/dL (ref 11.5–16.0)
IMMATURE GRANULOCYTE #: <0.1 10*3/uL (ref <0.10)
IMMATURE GRANULOCYTE %: 0 % (ref 0–1)
LYMPHOCYTE #: 3.65 10*3/uL (ref 1.00–4.80)
LYMPHOCYTE %: 40 %
MCH: 29.9 pg (ref 26.0–32.0)
MCHC: 34.6 g/dL (ref 31.0–35.5)
MCV: 86.4 fL (ref 78.0–100.0)
MONOCYTE #: 0.66 10*3/uL (ref 0.20–1.10)
MONOCYTE %: 7 %
MPV: 10.8 fL (ref 8.7–12.5)
NEUTROPHIL #: 4.59 10*3/uL (ref 1.50–7.70)
NEUTROPHIL %: 52 %
PLATELETS: 266 10*3/uL (ref 150–400)
RBC: 4.25 10*6/uL (ref 3.85–5.22)
RDW-CV: 12 % (ref 11.5–15.5)
WBC: 9.1 10*3/uL (ref 3.7–11.0)

## 2019-11-05 LAB — THYROXINE, FREE (FREE T4): THYROXINE (T4), FREE: 0.89 ng/dL (ref 0.70–1.25)

## 2019-11-05 LAB — THYROID STIMULATING HORMONE (SENSITIVE TSH): TSH: 0.912 u[IU]/mL (ref 0.430–3.550)

## 2019-11-09 LAB — QUANTIFERON TB GOLD PLUS, BLOOD
MITOGEN MINUS NIL RESULT: >10 IU/mL
NIL RESULT: 0.02 IU/mL
QUANTIFERON, QUALITATIVE: NEGATIVE
TB1 AG MINUS NIL RESULT: 0 IU/mL
TB2 AG MINUS NIL RESULT: -0.01 IU/mL

## 2019-11-18 ENCOUNTER — Other Ambulatory Visit: Payer: Self-pay

## 2020-01-07 MED FILL — Stelara 45 Mg/0.5ml Pfs: 84 days supply | Qty: 1 | Fill #0 | Status: CP

## 2020-02-01 ENCOUNTER — Other Ambulatory Visit: Payer: Self-pay

## 2020-03-27 ENCOUNTER — Other Ambulatory Visit (INDEPENDENT_AMBULATORY_CARE_PROVIDER_SITE_OTHER): Payer: Self-pay

## 2020-03-27 ENCOUNTER — Other Ambulatory Visit: Payer: Self-pay

## 2020-03-28 ENCOUNTER — Other Ambulatory Visit: Payer: Self-pay

## 2020-03-28 MED ORDER — STELARA 45 MG/0.5 ML SUBCUTANEOUS SYRINGE
INJECTION | SUBCUTANEOUS | 0 refills | Status: DC
Start: 2020-03-27 — End: 2020-06-15
  Filled 2020-03-28: qty 0.5, 84d supply, fill #0

## 2020-03-29 ENCOUNTER — Other Ambulatory Visit: Payer: Self-pay

## 2020-03-30 ENCOUNTER — Other Ambulatory Visit: Payer: Self-pay

## 2020-03-31 ENCOUNTER — Other Ambulatory Visit: Payer: Self-pay

## 2020-04-03 ENCOUNTER — Other Ambulatory Visit: Payer: Self-pay

## 2020-04-03 ENCOUNTER — Other Ambulatory Visit (INDEPENDENT_AMBULATORY_CARE_PROVIDER_SITE_OTHER): Payer: Self-pay | Admitting: Pharmacist

## 2020-04-03 NOTE — Telephone Encounter (Signed)
Specialty Pharmacy Follow-up Assessment Note    Date of assessment: 03/31/2020  Patient: Joyce Hunt is a 40 y.o. female  Diagnosis: Psoriasis  Specialty Medication(s): Stelara syringe - 45 mg subQ every 12 weeks  Prescriber: Laveda Norman (outside clinic)  Initiation of therapy: 2015    Subjective:  Spoke with patient for semi-annual follow-up regarding Stelara. Continues to take every 12 weeks as prescribed; reports 0 missed doses and using calendar as a method of adherence. Regarding adherence patient took last dose 1/4 and has 0 syringes left on hand.  Reports the following side effects: fever-like symptoms and nausea ~1 month prior to next dose of Stelara. She uses ibuprofen to resolve fever. She does monitor fever closely.    Patient reports the following signs/symptoms of psoriasis: still has symptoms towards end of treatment cycle. Her scalp itching is not as bad, but her dry skin on legs is more prominent. She states she has a plaque on her forearm the size of a nickel that has "bad" itching. She occasionally gets dryness on face and flakiness in ears. She also reports some achy muscles. She reports overall quality of life/symptoms are no different since last assessment.  Patient continues to work towards current goal of controlling psoriasis symptoms.  Denies missed days from work or planned activities due to disease. Denies recent hospitalizations/ER/urgent care visits in the past month related to diagnosis.      Obtained updated medication list with note of the following discrepancies: none    Objective:  Pertinent labs include: from 11/05/19 - all labs WNL    Assessment:  Reviewed medications, conditions, and allergies with no known contraindications for continuation in therapy.  No significant drug-drug interactions expected.  Reported medication on hand matches fill records and demonstrates adherence.     Patient is working towards therapeutic goal of: control signs and symptoms of disease.      Plan:   Continue therapy: therapy appropriate.  Patient declined review of administration process, storage, missed dose handling, and side effects today.     Scheduled pickup of Stelara for 3/29. Scheduled pharmacist follow-up for 6 months. Patient expressed no additional questions and was encouraged to contact [facility] with any questions or concerns.     Greer Pickerel, PHARMD  04/03/2020, 11:09

## 2020-04-04 ENCOUNTER — Other Ambulatory Visit: Payer: Self-pay

## 2020-04-22 ENCOUNTER — Encounter (INDEPENDENT_AMBULATORY_CARE_PROVIDER_SITE_OTHER): Payer: Self-pay | Admitting: Internal Medicine

## 2020-06-13 ENCOUNTER — Other Ambulatory Visit: Payer: Self-pay

## 2020-06-15 ENCOUNTER — Other Ambulatory Visit: Payer: Self-pay

## 2020-06-15 MED ORDER — USTEKINUMAB 45 MG/0.5 ML SUBCUTANEOUS SYRINGE
INJECTION | SUBCUTANEOUS | 0 refills | Status: DC
Start: 2020-06-15 — End: 2021-09-20
  Filled 2020-06-15 (×2): qty 0.5, 84d supply, fill #0

## 2020-06-16 ENCOUNTER — Ambulatory Visit (INDEPENDENT_AMBULATORY_CARE_PROVIDER_SITE_OTHER): Payer: Self-pay | Admitting: Internal Medicine

## 2020-06-16 ENCOUNTER — Other Ambulatory Visit: Payer: Self-pay

## 2020-06-21 ENCOUNTER — Other Ambulatory Visit: Payer: Self-pay

## 2020-06-21 MED ORDER — STELARA 45 MG/0.5 ML SUBCUTANEOUS SYRINGE
INJECTION | SUBCUTANEOUS | 0 refills | Status: DC
Start: 2020-06-21 — End: 2021-09-20
  Filled 2020-09-04 – 2020-09-06 (×4): qty 0.5, 84d supply, fill #0

## 2020-06-29 ENCOUNTER — Other Ambulatory Visit: Payer: Self-pay

## 2020-08-29 ENCOUNTER — Other Ambulatory Visit: Payer: Self-pay

## 2020-08-29 MED ORDER — STELARA 45 MG/0.5 ML SUBCUTANEOUS SYRINGE
INJECTION | SUBCUTANEOUS | 1 refills | Status: DC
Start: 2020-08-29 — End: 2021-05-10
  Filled 2020-11-27 – 2020-12-21 (×2): qty 0.5, 84d supply, fill #0
  Filled 2021-03-05 – 2021-03-21 (×2): qty 0.5, 84d supply, fill #1

## 2020-09-04 ENCOUNTER — Other Ambulatory Visit: Payer: Self-pay

## 2020-09-05 ENCOUNTER — Other Ambulatory Visit: Payer: Self-pay

## 2020-09-06 ENCOUNTER — Other Ambulatory Visit: Payer: Self-pay

## 2020-09-06 ENCOUNTER — Other Ambulatory Visit (INDEPENDENT_AMBULATORY_CARE_PROVIDER_SITE_OTHER): Payer: Self-pay | Admitting: Pharmacist

## 2020-09-06 NOTE — Telephone Encounter (Signed)
Specialty Pharmacy Follow-up Assessment Note    Date of assessment: 09/06/2020  Patient: Joyce Hunt is a 40 y.o. female  Diagnosis: Psoriasis  Specialty Medication(s): Stelara syringe - 45 mg subQ every 12 weeks  Prescriber: Laveda Norman (outside clinic)  Initiation of therapy: 2015    Subjective:  Spoke with Joyce Hunt for follow-up regarding Stelara. Patient reports potentially some delayed doses.  Reports the following side effects: none.  Long discussion regarding patients health and concerns she has regarding long term side effects.  Cardiology and rheumatology have seen her and are good with her continuing therapy.  She has some issues with hair falling out.     Current report: States symptoms are at bay right now.  She tries to wean off therapy and has itching on her legs with 1 to 2 plaques. Scalp is where she usually has symptoms and swelling. Reports symptoms are no different since last assessment.  Patient goal includes: controlling symptoms  Denies missed days from work or planned activities due to disease. Denies recent hospitalizations/ER/urgent care visits in the past month related to diagnosis.      Obtained updated medication list with note of the following discrepancies: none    Objective:  Reviewed pertinent labs in chart.     Assessment:  1. Therapeutic goal: Patient is working towards therapeutic goal of: disease remission.  Reviewed medications, conditions, labs, and allergies: therapy appropriate  2. Adherence: Patient report of medication on hand and fill records demonstrate adherence    Plan:   1. Continue therapy: Patient declined review of administration process, storage, missed dose handling, and side effects today.   2. Adherence: Discussed methods of adherence and scheduled medication delivery for 9/7 and next pharmacist follow-up for 6 months or sooner as needed.  Instructed patient to take injection on 9/13 and try to not spread out doses in future.     Patient expressed no additional  questions and was encouraged to contact [facility] with any questions or concerns.     Reginia Forts, PHARMD  09/06/2020, 15:04

## 2020-09-08 ENCOUNTER — Other Ambulatory Visit: Payer: Self-pay

## 2020-09-12 ENCOUNTER — Other Ambulatory Visit: Payer: Self-pay

## 2020-09-13 ENCOUNTER — Other Ambulatory Visit: Payer: Self-pay

## 2020-09-18 ENCOUNTER — Other Ambulatory Visit: Payer: Self-pay

## 2020-09-19 ENCOUNTER — Other Ambulatory Visit: Payer: Self-pay

## 2020-09-21 ENCOUNTER — Other Ambulatory Visit: Payer: Self-pay

## 2020-09-26 ENCOUNTER — Other Ambulatory Visit: Payer: Self-pay

## 2020-09-29 ENCOUNTER — Other Ambulatory Visit: Payer: Self-pay

## 2020-10-10 ENCOUNTER — Other Ambulatory Visit: Payer: Self-pay

## 2020-11-24 ENCOUNTER — Encounter (INDEPENDENT_AMBULATORY_CARE_PROVIDER_SITE_OTHER): Payer: Self-pay | Admitting: Neurology

## 2020-11-24 ENCOUNTER — Other Ambulatory Visit: Payer: Self-pay

## 2020-11-24 ENCOUNTER — Ambulatory Visit: Payer: BLUE CROSS/BLUE SHIELD | Attending: Neurology | Admitting: Neurology

## 2020-11-24 VITALS — BP 130/69 | HR 77 | Ht 62.0 in | Wt 142.4 lb

## 2020-11-24 DIAGNOSIS — R41840 Attention and concentration deficit: Secondary | ICD-10-CM | POA: Insufficient documentation

## 2020-11-24 NOTE — Progress Notes (Signed)
[facility]   [facility]      Operated by [facility]  Return Patient  Date:  11/24/2020  Age:40 y.o.  Referring Physician:     Flint Melter, MD  [address]  [address]  [place],  [place] [address]    Subjective:     Had headaches    Forgetfulness      Had concussion    Having cognitive issues    Had CT    GPA doesn't know    Major psychology  Has had to drop courses      Current Outpatient Medications   Medication Sig    cholecalciferol, vitamin D3, 1,000 unit Oral Tablet Take 2,000 Units by mouth Once a day    Ibuprofen (MOTRIN) 200 mg Oral Tablet Take 600 mg by mouth Four times a day as needed for Pain Needs refills    lidocaine (LIDODERM) 5 % Adhesive Patch, Medicated by Transdermal route Once a day    multivitamin Oral Tablet Take 1 Tab by mouth Once a day    ustekinumab (STELARA) 45 mg/0.5 mL Subcutaneous Syringe 45 mg by Subcutaneous route Every 3 months    ustekinumab (STELARA) 45 mg/0.5 mL Subcutaneous Syringe Inject one syringe (45 mg) subcutaneously (under the skin) every 12 weeks    ustekinumab (STELARA) 45 mg/0.5 mL Subcutaneous Syringe Inject one syringe subcutaneously every 3 months as directed    ustekinumab (STELARA) 45 mg/0.5 mL Subcutaneous Syringe Inject one syringe subcutaneously every 3 months         Objective:   Vital Signs:  BP 130/69    Pulse 77    Ht 1.575 m (5\' 2" )    Wt 64.6 kg (142 lb 6.7 oz)    BMI 26.05 kg/m     General:appears in good health  Ophthalmoscopic:normal w/o hemorrhages, exudates, or papilledema  Carotids:Carotids normal without bruit  Orientation:Alert and oriented x 3  Memory:Registration, Recall, and Following of commands is normal  Attention:Attention and Concentration are normal  Knowledge:Good  Language:Normal  Speech:Normal  Cranial nerves:Cranial nerves 2-12 are normal  Gait::Normal  Coordination:Coordination is normal without tremor  Sensory:Slight decrease  Muscle  tone:Upper and lower muscle tone is normal  Motor strength:Motor strength is normal throughout.  Reflexes:Reflexes are 2/2 throughout      Data Reviewed:    I have reviewed the following: internal records    Independent Interpretation:  none    Assessment and Plan:    1. Cognitive attention deficit      Orders Placed This Encounter    MRI BRAIN W/WO CONTRAST    Referral to Neuropsychiatry     RTC 2 months        , MD 11/24/2020, 14:56

## 2020-11-27 ENCOUNTER — Other Ambulatory Visit: Payer: Self-pay

## 2020-11-28 ENCOUNTER — Other Ambulatory Visit: Payer: Self-pay

## 2020-12-13 ENCOUNTER — Encounter (HOSPITAL_COMMUNITY): Payer: Self-pay

## 2020-12-21 ENCOUNTER — Other Ambulatory Visit: Payer: Self-pay

## 2020-12-25 ENCOUNTER — Other Ambulatory Visit: Payer: Self-pay

## 2020-12-25 ENCOUNTER — Ambulatory Visit: Payer: BLUE CROSS/BLUE SHIELD | Attending: Neurology | Admitting: Clinical Neuropsychologist

## 2020-12-25 DIAGNOSIS — F439 Reaction to severe stress, unspecified: Secondary | ICD-10-CM

## 2020-12-25 DIAGNOSIS — F329 Major depressive disorder, single episode, unspecified: Secondary | ICD-10-CM | POA: Insufficient documentation

## 2020-12-25 DIAGNOSIS — G8929 Other chronic pain: Secondary | ICD-10-CM

## 2020-12-25 DIAGNOSIS — S060X0A Concussion without loss of consciousness, initial encounter: Secondary | ICD-10-CM | POA: Insufficient documentation

## 2020-12-25 DIAGNOSIS — F419 Anxiety disorder, unspecified: Secondary | ICD-10-CM | POA: Insufficient documentation

## 2020-12-25 DIAGNOSIS — G3184 Mild cognitive impairment, so stated: Secondary | ICD-10-CM

## 2020-12-25 DIAGNOSIS — G4700 Insomnia, unspecified: Secondary | ICD-10-CM

## 2020-12-25 DIAGNOSIS — R41840 Attention and concentration deficit: Secondary | ICD-10-CM | POA: Insufficient documentation
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
## 2020-12-26 ENCOUNTER — Other Ambulatory Visit: Payer: Self-pay

## 2020-12-29 ENCOUNTER — Ambulatory Visit (INDEPENDENT_AMBULATORY_CARE_PROVIDER_SITE_OTHER): Payer: Self-pay

## 2020-12-29 ENCOUNTER — Other Ambulatory Visit: Payer: Self-pay

## 2021-01-03 ENCOUNTER — Other Ambulatory Visit: Payer: Self-pay

## 2021-01-04 ENCOUNTER — Encounter (INDEPENDENT_AMBULATORY_CARE_PROVIDER_SITE_OTHER): Payer: Self-pay | Admitting: Clinical Neuropsychologist

## 2021-01-04 DIAGNOSIS — F419 Anxiety disorder, unspecified: Secondary | ICD-10-CM

## 2021-01-04 DIAGNOSIS — S060X0A Concussion without loss of consciousness, initial encounter: Secondary | ICD-10-CM | POA: Insufficient documentation

## 2021-01-04 DIAGNOSIS — F329 Major depressive disorder, single episode, unspecified: Secondary | ICD-10-CM

## 2021-01-04 HISTORY — DX: Anxiety disorder, unspecified: F41.9

## 2021-01-04 HISTORY — DX: Major depressive disorder, single episode, unspecified: F32.9

## 2021-01-09 ENCOUNTER — Ambulatory Visit
Payer: No Typology Code available for payment source | Attending: Clinical Neuropsychologist | Admitting: Clinical Neuropsychologist

## 2021-01-09 DIAGNOSIS — G3184 Mild cognitive impairment, so stated: Secondary | ICD-10-CM | POA: Insufficient documentation

## 2021-01-09 DIAGNOSIS — F32A Depression, unspecified: Secondary | ICD-10-CM | POA: Insufficient documentation

## 2021-01-09 NOTE — Patient Instructions (Addendum)
The following recommendations were made:    Poor sleep can significantly impact your focus and learning; make sure that you make sleep a priority and maintain good sleep hygiene as much as you can. That includes not napping more than 15 minutes, even when really tired.     Exercise helps cognitive function and hyperactive/restless symptoms while promoting healthy sleep and stress reduction.    Consider practicing deep breathing: https://my.https://bradley.com/    Consider practicing progressive relaxation: https://youtu.be/1nZEdqcGVzo (YouTube)    Consider visiting web sites such as Https://chadd.org/ or ThirdIncome.ca to learn more about managing your inattention    There are a multitude of books on the market about ADHD and executive dysfunction.  Consider buying or lending such books to learn as much as you can about your condition. If you prefer to listen instead, many of these books are available as audiobooks:     Financial planner for Adults by Rockne Coons, PsyD.  This book offers useful tips and       strategies for dealing with executive dysfunction in general; often a good place to start.                     Delivered from Distraction by Antonieta Loveless, M.D.      Adult ADD: A Guide for the Newly Diagnosed by Alba Destine, Ph.D.     Taking Charge of Adult ADHD by Janese Banks, Ph.D.     The Smart But Scattered Guide to Success by Peg Arita Miss, EdD and Marjo Bicker, Ph.D.

## 2021-01-09 NOTE — Progress Notes (Signed)
NEUROPSYCHOLOGY FEEDBACK     PATIENT NAME: Joyce Hunt  MRN: [id number]  DOB: 09-20-1980  DOS: 01/09/2021  TIME IN/OUT: 3:30-4:25 pm     TELEMEDICINE DOCUMENTATION:    Patient Location: [address] [place] [address]  Patient/family aware of provider location:  Yes  Patient/family consent for telemedicine:  Yes  Examination observed and performed by: Delton Prairie, Psy.D., ABPP-CN    Subjective: "We're here for the testing results"    Objective: Pt. presented with her husband via Epic Video for feedback regarding her neuropsychological evaluation.  She was provided with all results and recommendations.  The patient showed good understanding of the findings.  Mood was euthymic after review of the findings without emotional distress or expression of self-harm.         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    Psychoeducation regarding the impact of mood, trauma, poor sleep, headaches, and concussions on cognition and functioning were discussed.    Due tohersignificant psychiatric symptoms, ongoing mental health treatment is high encouraged.  Specifically, she might benefit from learning progressive relaxation and deep breathing, which has also been shown to be beneficial for sleep and headaches.  Guided meditation could also be explored and will be discussed at the time of her feedback.  Resuming dance and physical activity is encouraged. While not currently diagnosed with ADHD, she could nevertheless benefit from reading or listening to books geared towards patients with  attention problems. List provided in instructions     The patient and their spouse were encouraged to contact me at any time should they have future questions, they have my contact information.     I reviewed the problem list only as it pertains to her cognitive and psychiatric diagnosis.      Delton Prairie, Psy.D., ABPP-CN  Associate Professor    [facility] Medicine and Psychiatry

## 2021-01-15 ENCOUNTER — Ambulatory Visit (INDEPENDENT_AMBULATORY_CARE_PROVIDER_SITE_OTHER): Payer: No Typology Code available for payment source

## 2021-01-29 ENCOUNTER — Ambulatory Visit (INDEPENDENT_AMBULATORY_CARE_PROVIDER_SITE_OTHER): Payer: Self-pay | Admitting: Neurology

## 2021-01-29 ENCOUNTER — Ambulatory Visit (INDEPENDENT_AMBULATORY_CARE_PROVIDER_SITE_OTHER): Payer: Self-pay

## 2021-02-15 ENCOUNTER — Other Ambulatory Visit (HOSPITAL_BASED_OUTPATIENT_CLINIC_OR_DEPARTMENT_OTHER): Payer: No Typology Code available for payment source

## 2021-02-15 ENCOUNTER — Other Ambulatory Visit: Payer: Self-pay

## 2021-02-15 ENCOUNTER — Inpatient Hospital Stay
Admission: RE | Admit: 2021-02-15 | Discharge: 2021-02-15 | Disposition: A | Discharge status: Home / Self Care | Payer: No Typology Code available for payment source | Source: Ambulatory Visit | Attending: Neurology | Admitting: Neurology

## 2021-02-15 DIAGNOSIS — R41840 Attention and concentration deficit: Secondary | ICD-10-CM | POA: Insufficient documentation

## 2021-02-15 DIAGNOSIS — L400 Psoriasis vulgaris: Secondary | ICD-10-CM

## 2021-02-15 DIAGNOSIS — R9082 White matter disease, unspecified: Secondary | ICD-10-CM

## 2021-02-15 MED ORDER — GADOBUTROL 7.5 MMOL/7.5 ML (1 MMOL/ML) INTRAVENOUS SOLUTION
6.0000 mL | INTRAVENOUS | Status: AC
Start: 2021-02-15 — End: 2021-02-15
  Administered 2021-02-15: 6 mL via INTRAVENOUS

## 2021-02-19 DIAGNOSIS — R9082 White matter disease, unspecified: Secondary | ICD-10-CM

## 2021-02-19 LAB — QUANTIFERON TB MITOGEN: QFT MITOGEN: >10 IU/mL

## 2021-02-19 LAB — QUANTIFERON TB1: QFT TB1: 0.06 [IU]/mL

## 2021-02-19 LAB — QUANTIFERON TB NIL: QFT NIL: 0.0462 [IU]/mL

## 2021-02-19 LAB — QUANTIFERON TB2: QFT TB2: 0.03 [IU]/mL

## 2021-02-19 LAB — QUANTIFERON: QFT QUALITATIVE: NEGATIVE

## 2021-02-21 ENCOUNTER — Encounter (INDEPENDENT_AMBULATORY_CARE_PROVIDER_SITE_OTHER): Payer: Self-pay | Admitting: Clinical Neuropsychologist

## 2021-02-22 ENCOUNTER — Ambulatory Visit (INDEPENDENT_AMBULATORY_CARE_PROVIDER_SITE_OTHER): Payer: Self-pay | Admitting: Neurology

## 2021-02-22 NOTE — Telephone Encounter (Signed)
Will you do letter?

## 2021-02-22 NOTE — Telephone Encounter (Signed)
Regarding: Joyce Hunt  ----- Message from Mariann Laster, PCA sent at 02/22/2021  1:01 PM EST -----  Pt has reaction to contrast in MRI, and had to miss 2 days of work. Her employeer is requesting a note stating that's why she was off of work.  Pt is requesting it be e-mailed.     Dates she was off work were 2/10 and 2/11    Please advise.

## 2021-02-23 NOTE — Telephone Encounter (Signed)
Called pt, no answer. VM full

## 2021-02-23 NOTE — Telephone Encounter (Signed)
Called pt  - no answer - voice mailbox is full  Will try again later

## 2021-02-23 NOTE — Telephone Encounter (Signed)
Joyce Oyster, MD  What kind of reaction   Did she tell anyone at the time

## 2021-02-26 NOTE — Telephone Encounter (Signed)
Called pt. VM is full  Will try again later
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
## 2021-03-05 ENCOUNTER — Other Ambulatory Visit: Payer: Self-pay

## 2021-03-06 ENCOUNTER — Ambulatory Visit (INDEPENDENT_AMBULATORY_CARE_PROVIDER_SITE_OTHER): Payer: Self-pay | Admitting: Neurology

## 2021-03-06 NOTE — Telephone Encounter (Signed)
Regarding: Joyce Hunt  ----- Message from Blenda Peals sent at 03/06/2021 12:06 PM EST -----  [name] is calling to see if her 03/09/21 appointment can be MyChart visit      she has to work that day and cannot take time off for an in-person visit       please call Dustin at [phone]

## 2021-03-06 NOTE — Telephone Encounter (Signed)
Called pt and lmom - we can't do MyChart video appts with [place] residents  Can she come another time?

## 2021-03-07 ENCOUNTER — Encounter (INDEPENDENT_AMBULATORY_CARE_PROVIDER_SITE_OTHER): Payer: Self-pay | Admitting: Neurology

## 2021-03-07 NOTE — Telephone Encounter (Signed)
Appt scheduled 04/16/21

## 2021-03-07 NOTE — Telephone Encounter (Signed)
-----   Message from [place] sent at 03/07/2021 12:18 PM EST -----  Regarding: Simple MRI follow up...  Contact: [phone]  Hi Dr. Monia Pouch,   I called and spoke to Shirley/Sherry from your office to reschedule an appointment date due to my work schedule conflict.  She then said "he doesn't do telemed because it's in [place]." I've had a telemed with you before and I've also had a telemed visit with the Neuropsych doctor who also works for [employer] this year in january. We also have hospitals that are in [place] hence the option of doing telemed so doctors can still see their patients. I then said ok then when is he available,  she says another date which I then responded with I work on Fridays I cannot do that day. She then responded "well he only has Fridays available". She then follows that with "maybe you should see if another provider is available to see you." I then said "just go ahead and cancel the appointment, I need to think about that." I then waited a bit, horrified and upset at the treatment and dismissiveness of her attitude and behavior. Which I find to be highly unprofessional and unethical in regards to a patients   care. I then shortly after called back the scheduling line and got a different person and they were able to schedule me in with you for a Monday not a Friday. It's interesting that your schedule suddenly opened up for a Monday. The fact that she boldly lied to me and made me feel like I couldn't trust anyone there is appalling! All I wanted to do was to get scheduled so we can go over the results of my MRI. Not a big deal. A call, a message or telemed visit would suffice as well. Ty.

## 2021-03-09 ENCOUNTER — Ambulatory Visit (INDEPENDENT_AMBULATORY_CARE_PROVIDER_SITE_OTHER): Payer: Self-pay | Admitting: Neurology

## 2021-03-16 NOTE — Telephone Encounter (Signed)
-----   Message from Crosby Oyster, MD sent at 03/16/2021  1:36 PM EST -----  Regarding: RE: Simple MRI follow up...  Contact: [phone]  I guess the law has changed   I don't have a [place] license  ----- Message -----  From: Lamont Dowdy, RN  Sent: 03/07/2021   1:21 PM EST  To: Crosby Oyster, MD  Subject: Simple MRI follow up...                          ----- Message from Lamont Dowdy, RN sent at 03/07/2021  1:21 PM EST -----       ----- Message from [place], [place] Joyce Hunt to Crosby Oyster, MD sent at 03/07/2021 12:18 PM -----   Hi Dr. Monia Pouch,   I called and spoke to Shirley/Sherry from your office to reschedule an appointment date due to my work schedule conflict.  She then said "he doesn't do telemed because it's in [place]." I've had a telemed with you before and I've also had a telemed visit with the Neuropsych doctor who also works for [employer] this year in january. We also have hospitals that are in [place] hence the option of doing telemed so doctors can still see their patients. I then said ok then when is he available,  she says another date which I then responded with I work on Fridays I cannot do that day. She then responded "well he only has Fridays available". She then follows that with "maybe you should see if another provider is available to see you." I then said "just go ahead and cancel the appointment, I need to think about that." I then waited a bit, horrified and upset at the treatment and dismissiveness of her attitude and behavior. Which I find to be highly unprofessional and unethical in regards to a patients   care. I then shortly after called back the scheduling line and got a different person and they were able to schedule me in with you for a Monday not a Friday. It's interesting that your schedule suddenly opened up for a Monday. The fact that she boldly lied to me and made me feel like I couldn't trust anyone there is appalling! All I wanted to do was to get scheduled so we can go over the  results of my MRI. Not a big deal. A call, a message or telemed visit would suffice as well. Ty.

## 2021-03-21 ENCOUNTER — Other Ambulatory Visit: Payer: Self-pay

## 2021-03-22 ENCOUNTER — Other Ambulatory Visit: Payer: Self-pay

## 2021-03-23 ENCOUNTER — Other Ambulatory Visit: Payer: Self-pay

## 2021-03-27 ENCOUNTER — Other Ambulatory Visit: Payer: Self-pay

## 2021-03-27 ENCOUNTER — Other Ambulatory Visit: Payer: Self-pay | Admitting: Pharmacist

## 2021-03-27 NOTE — Telephone Encounter (Signed)
Specialty Pharmacy Follow-up Assessment Note    Date of assessment: 03/27/2021  Patient: Joyce Hunt is a 41 y.o. female  Diagnosis:Psoriasis  Specialty Medication(s):Stelara syringe - 45 mg subQ every 12 weeks  Prescriber:Rola Aris [place] (outside clinic)  Initiation of therapy:2015    Subjective:  Spoke with Joyce Hunt for follow-up regarding Stelara. Patient reports 0 missed doses and using calendar as a method of adherence. Patient took last dose 12/28 with next dose due 3/22.  Reports the following side effects: none.    Current report: she is still having some inflammation--thinking maybe she has some RA symptoms as well. Itching of scalp, breakout on scalp. Some skin pealing. Denies any flares on legs.  Is having itching several places where she has had past flare ups. Reports symptoms are no different since last assessment.  Patient goal includes: controlling symptoms  Denies missed days from work or planned activities due to disease. Denies recent hospitalizations/ER/urgent care visits in the past month related to diagnosis.      Obtained updated medication list with note of the following discrepancies: none.  She is having maybe some ulcers in throat.    Objective:  Reviewed pertinent labs in chart.     Assessment:  1. Therapeutic goal: Patient is working towards therapeutic goal of: medication efficacy and tolerability.  Reviewed medications, conditions, labs, and allergies: therapy appropriate  2. Adherence: Patient report of medication on hand and fill records demonstrate adherence    Plan:   1. Continue therapy: Patient declined medication education.   2. Adherence: Discussed methods of adherence and scheduled medication delivery for 3/ and next pharmacist follow-up for 6 months or sooner as needed.    Patient expressed no additional questions and was encouraged to contact [facility] with any questions or concerns.     Barbera Setters, PHARMD  03/27/2021, 15:04    Barbera Setters, PHARMD   03/27/2021, 15:04  Delivery Date: pickup Monday 3/27  Payment: none  Supplies: alcohol swabs  Signature Required: PICKUP  Animal: n/a    Therigy assessment complete. Spoke with patient, who confirmed med/dosage. Scheduled the following medication(s) Stelara. Reports no changes since the last delivery and 0 missed doses. Patient has 0 remaining and will need current delivery by 3/22. No questions/concerns for the pharmacist.

## 2021-03-28 ENCOUNTER — Other Ambulatory Visit: Payer: Self-pay

## 2021-04-02 ENCOUNTER — Other Ambulatory Visit: Payer: Self-pay

## 2021-04-16 ENCOUNTER — Ambulatory Visit (INDEPENDENT_AMBULATORY_CARE_PROVIDER_SITE_OTHER): Payer: Self-pay | Admitting: Neurology

## 2021-05-10 ENCOUNTER — Other Ambulatory Visit: Payer: Self-pay

## 2021-05-10 MED ORDER — STELARA 45 MG/0.5 ML SUBCUTANEOUS SYRINGE
INJECTION | SUBCUTANEOUS | 2 refills | Status: DC
Start: 2021-05-10 — End: 2022-03-20
  Filled 2021-05-10 – 2021-06-05 (×2): qty 0.5, 84d supply, fill #0
  Filled 2021-08-29 – 2021-09-18 (×2): qty 0.5, 84d supply, fill #1
  Filled 2021-12-11: qty 0.5, 84d supply, fill #2

## 2021-06-05 ENCOUNTER — Other Ambulatory Visit: Payer: Self-pay

## 2021-06-07 ENCOUNTER — Other Ambulatory Visit: Payer: Self-pay

## 2021-06-11 ENCOUNTER — Other Ambulatory Visit: Payer: Self-pay

## 2021-06-14 ENCOUNTER — Other Ambulatory Visit: Payer: Self-pay

## 2021-06-15 ENCOUNTER — Other Ambulatory Visit: Payer: Self-pay

## 2021-06-19 ENCOUNTER — Other Ambulatory Visit: Payer: Self-pay

## 2021-06-21 ENCOUNTER — Other Ambulatory Visit: Payer: Self-pay

## 2021-08-01 ENCOUNTER — Other Ambulatory Visit: Payer: Self-pay

## 2021-08-29 ENCOUNTER — Other Ambulatory Visit: Payer: Self-pay

## 2021-08-30 ENCOUNTER — Other Ambulatory Visit: Payer: Self-pay

## 2021-09-18 ENCOUNTER — Other Ambulatory Visit: Payer: Self-pay

## 2021-09-20 ENCOUNTER — Other Ambulatory Visit: Payer: Self-pay | Admitting: Pharmacist

## 2021-09-20 ENCOUNTER — Other Ambulatory Visit: Payer: Self-pay

## 2021-09-20 NOTE — Telephone Encounter (Signed)
Specialty Pharmacy Follow-up Assessment Note    Date of assessment: 09/20/2021  Patient: Joyce Hunt is a 41 y.o. female  Diagnosis: psoriasis  Specialty Medication(s): Stelara 45 mg SC every 12 weeks  Prescriber: Dr. Danella Penton (outside clinic)  Initiation of therapy: 2015    Subjective:  Spoke with patient today for semi-annual follow-up regarding Stelara. Continues to take every 12 weeks; reports 1 missed dose (took last dose a couple weeks late due to busy schedule) and using phone and paper calendar as a method of adherence. Regarding adherence patient took last dose 7/1 and has zero syringes left on hand. Reports the following side effects: lightheadedness after injection but tolerable since resolves after 30-40 min. Denies recent infection. Mentions occasional, short-lived fevers with chills. She monitors temperature during these episodes and is aware to hold Stelara during active infection.    Regarding psoriasis:  -denies severe flares since last call  -scalp, ears and lower legs will occasionally itch but it resolves quickly so is not bothersome  -denies need for topicals      Medication List:  -updated  -mentions potentially starting metoprolol soon (currently wearing Holter monitor)    Objective:  Pertinent labs include:   02/15/21 Quantiferon TB Gold negative  12/26/16 HBsAg negative  12/26/16 anti-HBc negative  12/26/16 anti-HBs 28    Assessment:  -reviewed medications, conditions, and allergies with no known contraindications for continuation in therapy  -no significant drug-drug interactions expected  -reported medication on hand does not match fill records and demonstrates non-adherence  -patient is working towards therapeutic goal of low disease activity     Plan:   -continue therapy: therapy appropriate  -patient declined review of medication today  -she asked if any concern with starting metoprolol while on Stelara (no drug interactions or contraindications identified)  -scheduled pick up  of Stelara for Thurs 9/21 with alcohol swab and Band-Aid (refill due 9/23)  -scheduled pharmacist follow-up for 6 months    Patient expressed no additional questions and was encouraged to contact [facility] with any questions or concerns.     [employer], PHARMD  09/20/2021, 16:08

## 2021-09-21 ENCOUNTER — Other Ambulatory Visit: Payer: Self-pay

## 2021-09-27 ENCOUNTER — Other Ambulatory Visit: Payer: Self-pay

## 2021-09-28 ENCOUNTER — Other Ambulatory Visit: Payer: Self-pay

## 2021-11-14 ENCOUNTER — Encounter (HOSPITAL_COMMUNITY): Payer: Self-pay

## 2021-12-11 ENCOUNTER — Other Ambulatory Visit: Payer: Self-pay

## 2021-12-13 ENCOUNTER — Other Ambulatory Visit: Payer: Self-pay

## 2021-12-17 ENCOUNTER — Other Ambulatory Visit: Payer: Self-pay

## 2021-12-21 ENCOUNTER — Other Ambulatory Visit: Payer: Self-pay

## 2021-12-22 ENCOUNTER — Other Ambulatory Visit: Payer: Self-pay

## 2022-03-08 ENCOUNTER — Other Ambulatory Visit: Payer: Self-pay

## 2022-03-11 ENCOUNTER — Other Ambulatory Visit: Payer: Self-pay

## 2022-03-13 ENCOUNTER — Other Ambulatory Visit: Payer: Self-pay

## 2022-03-15 ENCOUNTER — Other Ambulatory Visit: Payer: Self-pay

## 2022-03-16 ENCOUNTER — Other Ambulatory Visit: Payer: Self-pay

## 2022-03-18 ENCOUNTER — Other Ambulatory Visit: Payer: Self-pay

## 2022-03-18 DIAGNOSIS — L400 Psoriasis vulgaris: Secondary | ICD-10-CM

## 2022-03-18 MED ORDER — STELARA 45 MG/0.5 ML SUBCUTANEOUS SYRINGE
INJECTION | SUBCUTANEOUS | 0 refills | Status: DC
Start: 2022-03-18 — End: 2022-06-18
  Filled 2022-03-18: qty 0.5, 84d supply, fill #0

## 2022-03-19 ENCOUNTER — Other Ambulatory Visit: Payer: Self-pay

## 2022-03-20 ENCOUNTER — Other Ambulatory Visit: Payer: Self-pay | Admitting: Pharmacist

## 2022-03-20 ENCOUNTER — Other Ambulatory Visit: Payer: Self-pay

## 2022-03-20 NOTE — Telephone Encounter (Signed)
Specialty Pharmacy Follow-up Assessment Note    Date of assessment: 03/20/2022  Patient: Joyce Hunt is a 42 y.o. female  Diagnosis: psoriasis   Specialty Medication(s): Stelara 45 mg SC every 12 weeks   Prescriber: Dr. Marry Guan (outside clinic)   Initiation of therapy: 2015    Subjective:  Spoke with patient today for semi-annual follow-up regarding Stelara. Continues to take every 12 weeks; reports zero missed doses and using phone calendar as a method of adherence. Regarding adherence, patient unsure date of last dose (thinks it was week before Xmas - she delayed by 1 week after Gardasil dose) and has zero syringes left on hand. Reports the following side effects: lightheadedness after injection but tolerable since resolves after 30-40 min. Denies recent infection. Mentions occasional, short-lived fevers with chills. She monitors temperature during these episodes and is aware to hold Stelara during active infection.     Regarding PsO:  -flares remain manageable on Stelara  -symptoms seem to increase closer to when injection come due (advised some patients take more frequently than every 12 weeks to control flares if insurance approves it, but to discuss w/ provider at next appt)  -currently has plaques on legs, ears and scalp  -hair seems to be falling out more she thinks due to added stress and hormone fluctuations post TBI, advised to contact Dermatologist if she has not already for additional guidance  -Rheumatology monitors hip pain and potential PsA (alternates Celebrex and ibuprofen prn)    Medication List:  -reviewed/updated    Objective:  Pertinent labs include:   02/15/21 Quantiferon TB Gold negative  12/26/16 HBsAg negative  12/26/16 anti-HBc negative  12/26/16 anti-HBs 28     Assessment:  -reviewed medications, conditions, and allergies with no known contraindications for continuation in therapy  -no significant drug-drug interactions expected  -reported medication on hand does not match fill  records  -patient is working towards therapeutic goal of low disease activity/disease remission     Plan:   -continue therapy: therapy appropriate  -patient declined review of medication today  -scheduled delivery of Stelara for Thurs 3/14 with supplies (she plans to inject this weekend)  -scheduled pharmacist follow-up for 6 months    Patient expressed no additional questions and was encouraged to contact [facility] with any questions or concerns.     Merian Capron, PHARMD  03/20/2022, 09:30

## 2022-03-21 ENCOUNTER — Other Ambulatory Visit: Payer: Self-pay

## 2022-03-22 ENCOUNTER — Other Ambulatory Visit: Payer: Self-pay

## 2022-03-27 IMAGING — MR MRI WRIST RT W/O CONTRAST
4 of 8 series · 20 of 40 positions shown · non-contrast
Comparison: none

﻿

Pertinent Hx:    Wrist injury 01/30/2022.  Wrist pain.
TECHNIQUE: Images were taken in axial, coronal, and sagittal planes.  T1 and T2-weighted imaging was performed.

[Series 3: PD fat-sat · axial · 4.0mm · 0.23mm/px · z∈[-63,+5]mm · 5 of 19 slices shown (1 of 3)]
[im 1/19]
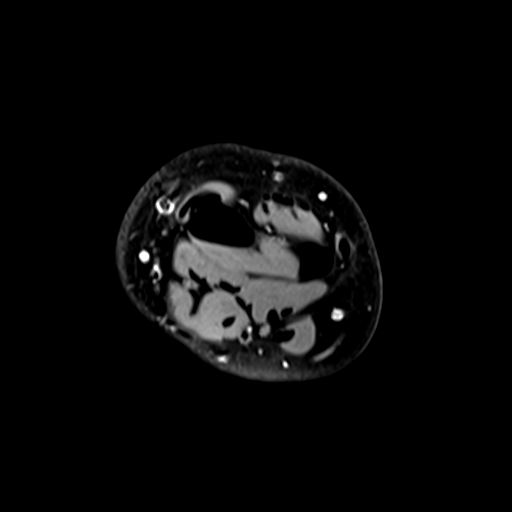
[im 5/19]
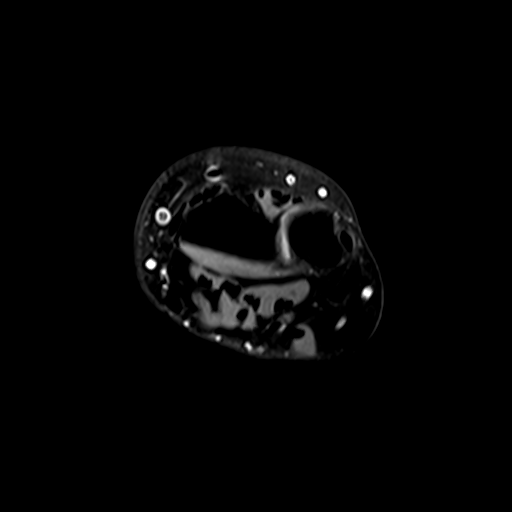
[im 10/19]
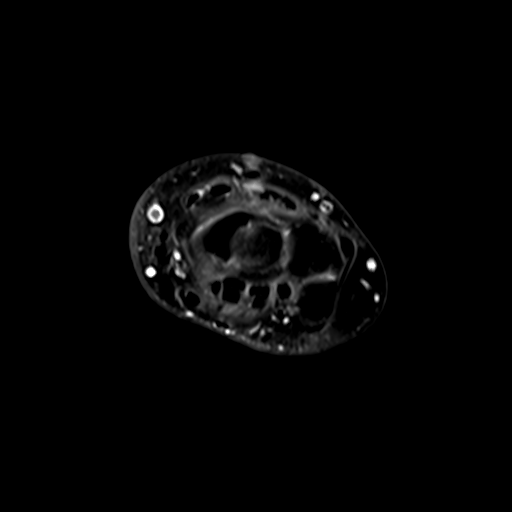
[im 14/19]
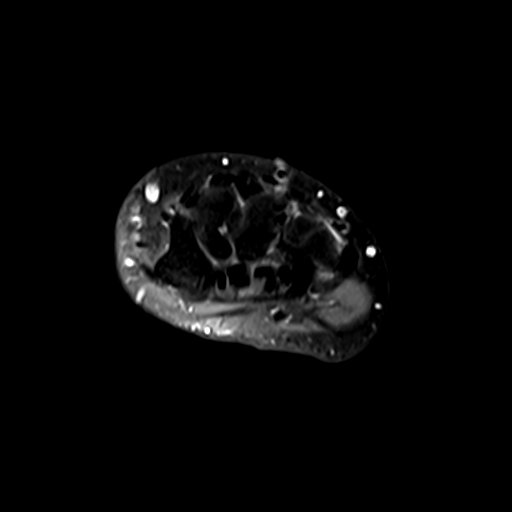
[im 19/19]
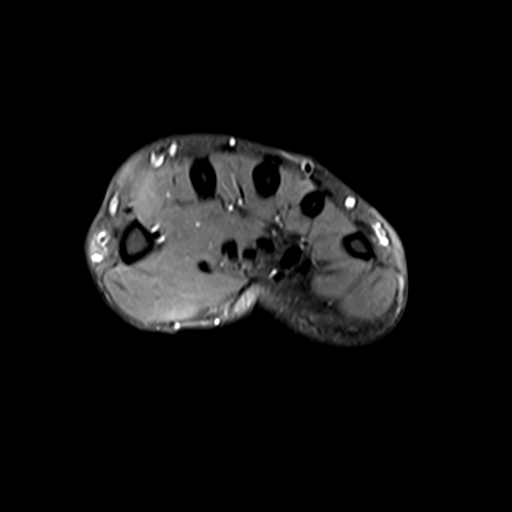

[Series 4: T1 · axial · 4.0mm · 0.23mm/px · z∈[-63,+5]mm · 4 of 19 slices shown]
[im 1/19]
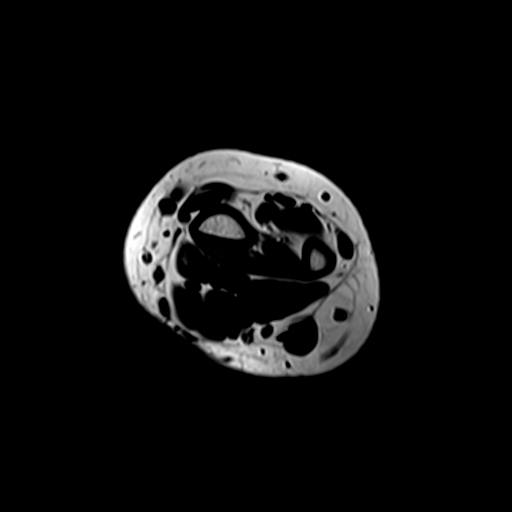
[im 5/19]
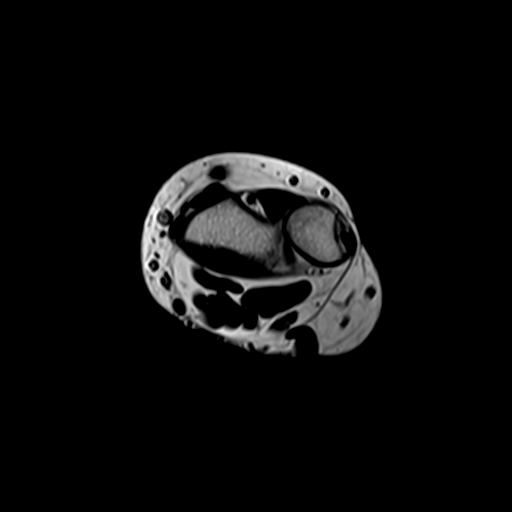
[im 10/19]
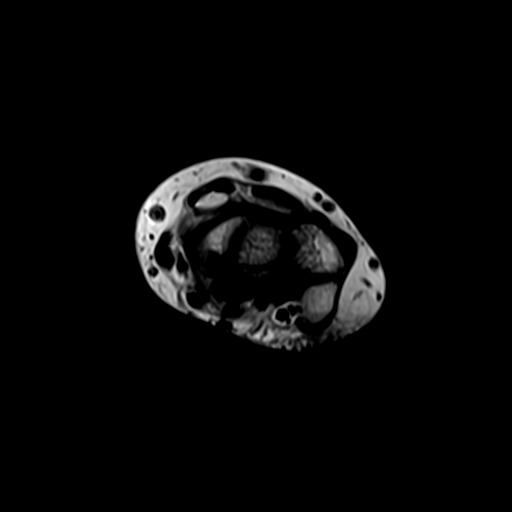
[im 19/19]
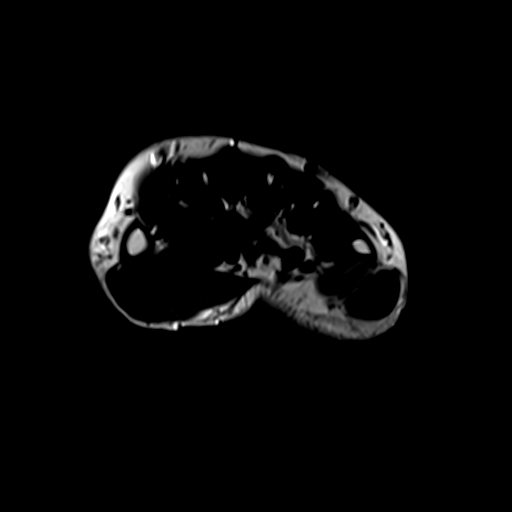

[Series 6: PD fat-sat · sagittal · 3.0mm · 0.23mm/px · 6 of 23 slices shown (2 of 3)]
[im 1/23]
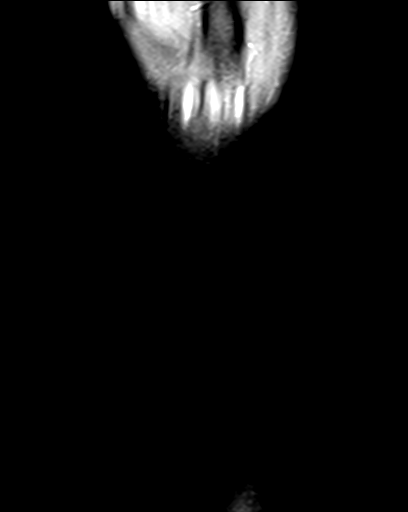
[im 5/23]
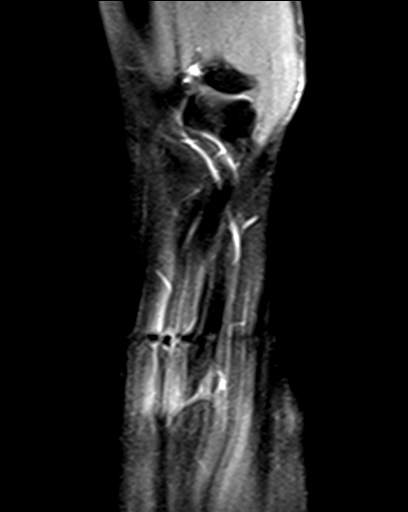
[im 9/23]
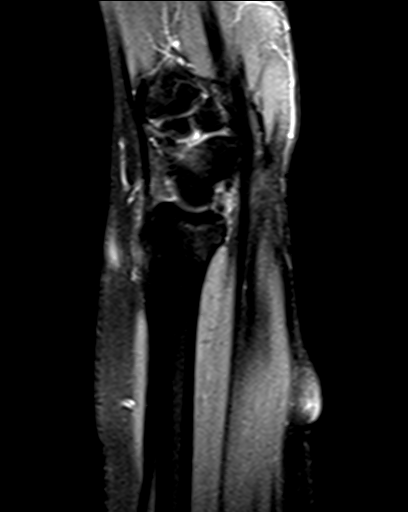
[im 14/23]
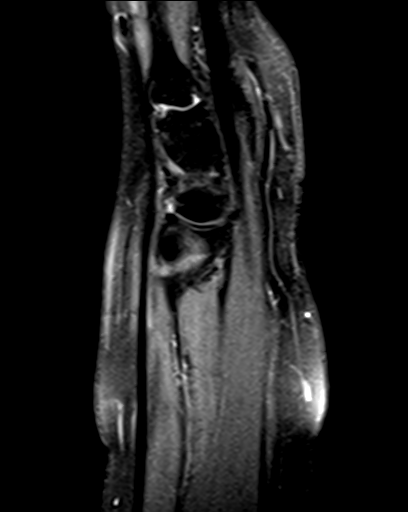
[im 18/23]
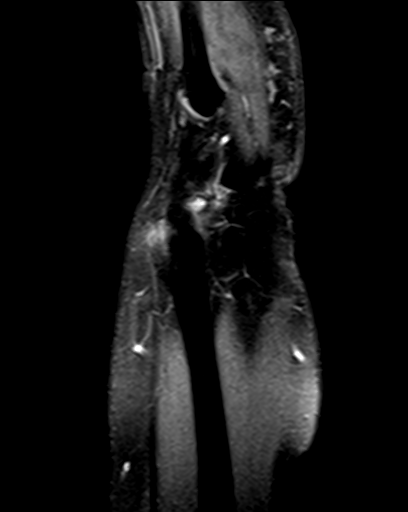
[im 23/23]
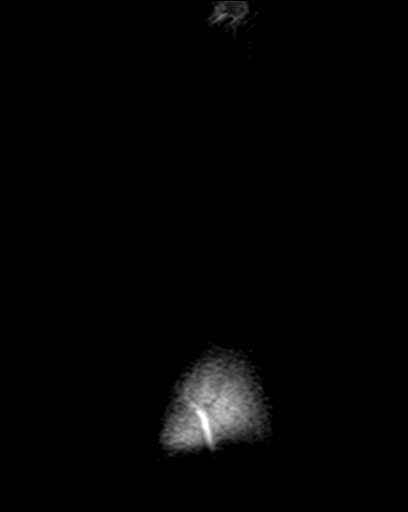

[Series 9: PD fat-sat · coronal · 3.0mm · 0.23mm/px · 5 of 20 slices shown (3 of 3)]
[im 1/20]
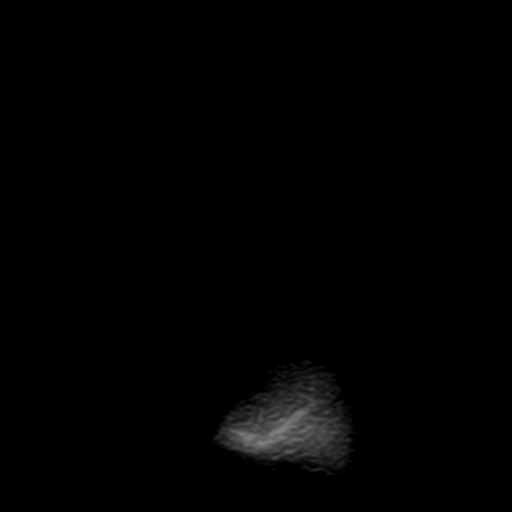
[im 5/20]
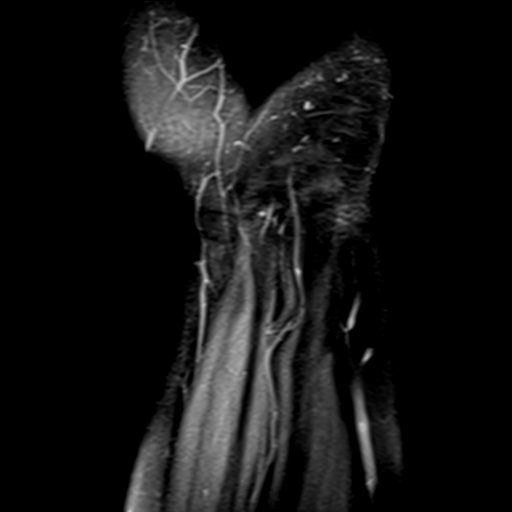
[im 10/20]
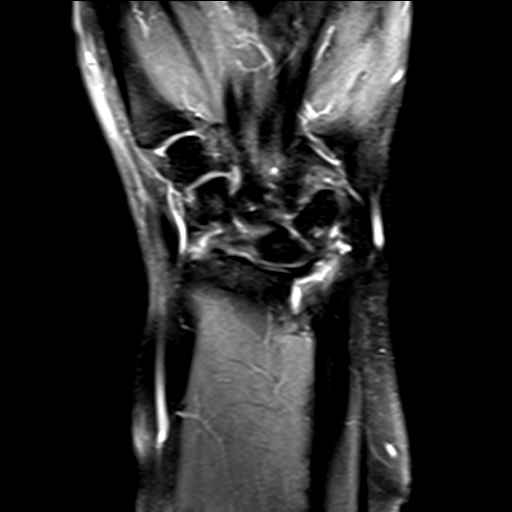
[im 15/20]
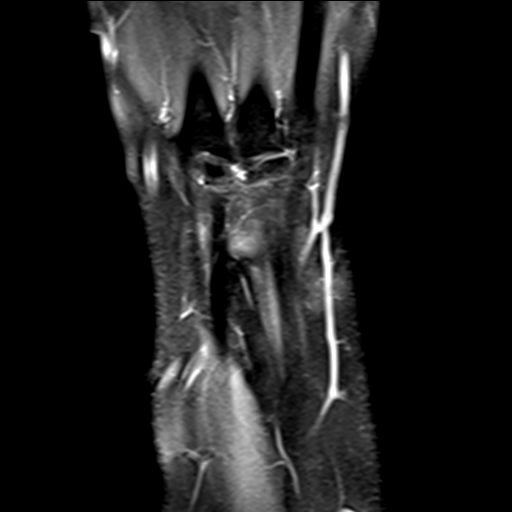
[im 20/20]
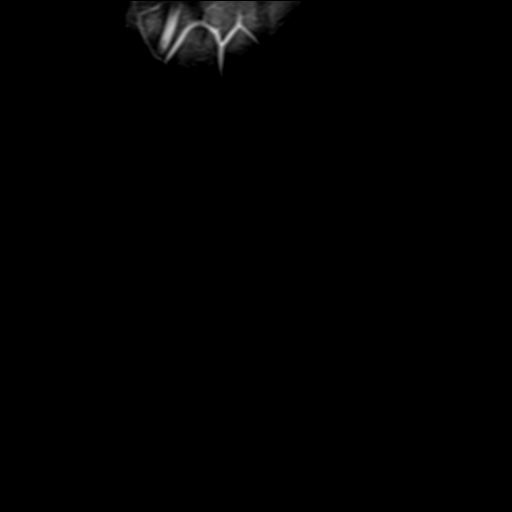

[20 of 40 positions shown; findings below may reference images not displayed]

FINDINGS: Alignment is normal.  

There is no fracture.  

No ligament tear can be identified.  

There is minimal fluid within the radiocarpal and midcarpal joints.  There is a joint effusion in the distal radioulnar joint.  

No tear of the triangular fibrocartilage complex can be identified.  

There is fluid within the sheath of the extensor carpi ulnaris tendon.  Signal intensity within the tendon is normal.  No tendon tear identified.  Other tendons appear normal.  

No other abnormality can be identified.
IMPRESSION: 1. Distal radioulnar joint effusion.  

2. Fluid within the sheath of the extensor carpi ulnaris tendon.  No evidence of tendon tear.  

3. No tear of the triangular fibrocartilage complex.  

4. No other abnormality identified.

## 2022-04-29 ENCOUNTER — Other Ambulatory Visit: Payer: Self-pay

## 2022-05-22 ENCOUNTER — Other Ambulatory Visit: Payer: Self-pay

## 2022-06-04 ENCOUNTER — Other Ambulatory Visit: Payer: Self-pay

## 2022-06-09 ENCOUNTER — Other Ambulatory Visit: Payer: Self-pay

## 2022-06-13 ENCOUNTER — Other Ambulatory Visit: Payer: Self-pay

## 2022-06-14 ENCOUNTER — Encounter (INDEPENDENT_AMBULATORY_CARE_PROVIDER_SITE_OTHER): Payer: Self-pay

## 2022-06-18 ENCOUNTER — Other Ambulatory Visit: Payer: Self-pay

## 2022-06-18 MED ORDER — STELARA 45 MG/0.5 ML SUBCUTANEOUS SYRINGE
INJECTION | SUBCUTANEOUS | 3 refills | Status: DC
Start: 2022-06-17 — End: 2023-06-17
  Filled 2022-06-18: qty 0.5, 84d supply, fill #0
  Filled 2022-09-02: qty 0.5, 84d supply, fill #1
  Filled 2022-11-18 – 2022-12-02 (×2): qty 0.5, 84d supply, fill #2
  Filled 2023-02-24 – 2023-03-17 (×2): qty 0.5, 84d supply, fill #3

## 2022-06-19 ENCOUNTER — Other Ambulatory Visit: Payer: Self-pay

## 2022-06-19 DIAGNOSIS — L400 Psoriasis vulgaris: Secondary | ICD-10-CM

## 2022-06-20 ENCOUNTER — Other Ambulatory Visit: Payer: Self-pay

## 2022-06-21 ENCOUNTER — Other Ambulatory Visit: Payer: Self-pay

## 2022-06-24 ENCOUNTER — Other Ambulatory Visit: Payer: Self-pay

## 2022-08-26 ENCOUNTER — Other Ambulatory Visit: Payer: Self-pay

## 2022-09-02 ENCOUNTER — Other Ambulatory Visit: Payer: Self-pay

## 2022-09-02 DIAGNOSIS — L400 Psoriasis vulgaris: Secondary | ICD-10-CM

## 2022-09-02 NOTE — Telephone Encounter (Signed)
Specialty Pharmacy Follow-up Assessment Note    Date of assessment: 09/02/2022  Patient: Joyce Hunt is a 42 y.o. female  Diagnosis: PsA/PsO  Specialty Medication(s): Stelara 45 mg SQ once every 12 weeks  Prescriber: Danella Penton, MD (outside clinic)  Initiation of therapy: 2015    Subjective:  Spoke with patient for 6 month follow-up regarding Stelara. Continues to take every 12 weeks; reports no missed doses and using phone calendaar as a method of adherence. Regarding adherence patient took last dose 6/19 and has 0 left on hand.  Reports the following side effects: reports nausea at times and mentions occasional, short-lived fevers with chills. She monitors temperature during these episodes and is aware to hold Stelara during active infection..    Patient reports the following signs/symptoms of PsA/PsO: has some skin inflammation on her thigh, plaque on her ears as well. Also reports some itching on her arms and her scalp. Reports morning stiffness for about 30-45 minutes everyday. States moving and exercise does make her joints feel better and reports trying to exercise more. She also states her lower back, hip and ankles will hurt with walking and limits her activities at times. Reports no hair loss like she was previously experiencing. Reports missed days from work or planned activities due to disease. Reports recent hospitalizations/ER/urgent care visits in the past month, may or may not be related to diagnosis. She reports feeling symptoms of an MI, but then states it was not and that provider at visit thought it may have been a inflammatory response.    Obtained updated medication list with note of the following discrepancies: updated in Epic    Objective:  Pertinent labs include: outside clinic, HBV(-) (2018), TB(-) 2023)    Assessment:  Reviewed medications, conditions, and allergies with no known contraindications for continuation in therapy.  No significant drug-drug interactions expected.   Reported medication on hand matches fill records and demonstrates adherence.     Patient is working towards therapeutic goal of: disease remission.     Plan:   Continue therapy: therapy appropriate.  Recommended patient contact dermatology clinic about increasing Stelara frequency.     Scheduled pickup of Stelara for 9/6 with patient. Scheduled pharmacist follow-up in 6 months. Patient expressed no additional questions and was encouraged to contact [facility] with any questions or concerns.     Hoover Brunette, [facility]  09/02/2022, 12:02

## 2022-09-05 ENCOUNTER — Other Ambulatory Visit: Payer: Self-pay

## 2022-09-06 ENCOUNTER — Other Ambulatory Visit: Payer: Self-pay

## 2022-09-13 ENCOUNTER — Other Ambulatory Visit: Payer: Self-pay

## 2022-11-18 ENCOUNTER — Other Ambulatory Visit: Payer: Self-pay

## 2022-12-02 ENCOUNTER — Other Ambulatory Visit: Payer: Self-pay

## 2022-12-02 DIAGNOSIS — L400 Psoriasis vulgaris: Secondary | ICD-10-CM

## 2022-12-04 ENCOUNTER — Other Ambulatory Visit: Payer: Self-pay

## 2022-12-10 ENCOUNTER — Other Ambulatory Visit: Payer: Self-pay

## 2023-02-03 ENCOUNTER — Other Ambulatory Visit: Payer: Self-pay

## 2023-02-24 ENCOUNTER — Other Ambulatory Visit: Payer: Self-pay

## 2023-02-25 ENCOUNTER — Other Ambulatory Visit: Payer: Self-pay

## 2023-03-05 ENCOUNTER — Other Ambulatory Visit: Payer: Self-pay

## 2023-03-05 NOTE — Telephone Encounter (Signed)
 Specialty Pharmacy Follow Up Note    Date of assessment: 03/05/2023  Patient: Joyce Hunt is a 43 y.o. female  Diagnosis: PsO/PsA  Specialty Medication: Stelara 45 mg SQ once every 12 weeks   Prescriber: Danella Penton, MD (outside clinic)   Initiation of therapy: 2015      Subjective:  Spoke with patient for semi-annual follow-up regarding Stelara.    Disease state: PsO/PsA:   Symptom report: reports she is having some itchiness along with some joint issues. Reports she has some joint swelling and some joint pains.   Consider all the ways illness and health conditions affect you at this time, please provide a general score to indicate how you're doing (0=very well, 10=very poor): declined at this time  Missed days from work, school or planned activities due to disease in the past 4 weeks: No  Patient reported goal includes: reduction/management of psoriasis symptoms   Side effects: reports frequent infections, currently has a bad sinus infection that has last about 14 days. Reports she has been getting other URIs frequently.    Adherence: Reports 1 missed doses due to illness.  Reports using calendar as a method of adherence. Patient took last dose 12/4 and has 0 left on hand.   Medication list reported changes:  taking amoxicillin currently for sinus infection.      Objective:  Pertinent labs and monitoring parameters include:   outside clinic, HBV(-) (2018), TB(-) (2023)     Assessment/Plan:  Disease state: medication appropriate to continue with therapeutic goal of: disease remission. Reviewed medication purpose, administration, and when to contact the pharmacy, when to contact the clinic, when to seek appropriate care. Patient declined review of storage/handling and disease state.   Side effects: experiencing but tolerable provided mitigation strategies including when to hold medication for sickness and discussing frequent infections with provider    Adherence: Patient report of medication on hand does not  match fill records; however patient is sick and holding appropriately. Reminded patient of the importance of adherence and reviewed missed dose instructions.  Medication list: reviewed with patient; up-to-date as of today  Notable drug-interactions or contraindications: none.  Declined to fill Stelara currently because she is sick and would like to recover fully - will have technician reach out to schedule in about a week. Scheduled pharmacist follow up for 6 months or sooner as needed.     Patient expressed no additional questions and was encouraged to contact [facility] with any questions or concerns.     Hoover Brunette, [facility] 03/05/2023, 10:57

## 2023-03-17 ENCOUNTER — Other Ambulatory Visit: Payer: Self-pay

## 2023-03-17 DIAGNOSIS — L400 Psoriasis vulgaris: Secondary | ICD-10-CM

## 2023-03-18 ENCOUNTER — Other Ambulatory Visit: Payer: Self-pay

## 2023-03-19 ENCOUNTER — Other Ambulatory Visit: Payer: Self-pay

## 2023-03-19 ENCOUNTER — Other Ambulatory Visit: Payer: Self-pay | Admitting: GENERAL

## 2023-03-19 NOTE — Telephone Encounter (Signed)
 Specialty Pharmacy Follow Up Note    Date of assessment: 03/19/2023  Patient: Joyce Hunt is a 43 y.o. female  Diagnosis: PsO/PsA  Specialty Medication: Stelara 45 mg SQ once every 12 weeks  Prescriber: Danella Penton, MD (outside clinic)   Initiation of therapy: 2015     Spoke with patient, who was triaged to pharmacist regarding concerns re: right sided numbness, tingling, and weakness "like on fire", nausea that is more frequent than normal, twitching in her face which she notes feels like stroke-like symptoms, frequent infections, recent sinus infection with fever, nausea and vomiting for which she received a course of antibiotics in addition to worsening old injury inflammation.     Discussed common psoriatic arthritis and psoriasis symptoms vs common stroke symptoms. Advised patient to seek medical attention if symptom return in any fashion. Patient expressed no additional questions and was encouraged to contact [facility] with any questions or concerns.     Shanese Riemenschneider, PHARMD 03/19/2023, 16:08

## 2023-03-20 ENCOUNTER — Other Ambulatory Visit: Payer: Self-pay

## 2023-03-24 ENCOUNTER — Other Ambulatory Visit: Payer: Self-pay

## 2023-03-25 ENCOUNTER — Other Ambulatory Visit: Payer: Self-pay

## 2023-05-29 ENCOUNTER — Other Ambulatory Visit: Payer: Self-pay

## 2023-06-04 ENCOUNTER — Ambulatory Visit

## 2023-06-04 ENCOUNTER — Other Ambulatory Visit: Payer: Self-pay

## 2023-06-04 DIAGNOSIS — L4059 Other psoriatic arthropathy: Secondary | ICD-10-CM | POA: Insufficient documentation

## 2023-06-06 LAB — QUANTIFERON TB2: QFT TB2: 0.05 [IU]/mL

## 2023-06-06 LAB — QUANTIFERON
QFT MITOGEN: >10 [IU]/mL
QFT NIL: 0.0517 [IU]/mL
QFT QUALITATIVE: NEGATIVE
QFT TB1: 0.06 [IU]/mL
QFT TB2: 0.05 [IU]/mL

## 2023-06-06 LAB — QUANTIFERON TB1: QFT TB1: 0.06 [IU]/mL

## 2023-06-06 LAB — QUANTIFERON TB NIL: QFT NIL: 0.0517 [IU]/mL

## 2023-06-06 LAB — QUANTIFERON TB MITOGEN: QFT MITOGEN: >10 [IU]/mL

## 2023-06-09 ENCOUNTER — Other Ambulatory Visit: Payer: Self-pay

## 2023-06-13 ENCOUNTER — Other Ambulatory Visit: Payer: Self-pay

## 2023-06-13 MED ORDER — USTEKINUMAB 45 MG/0.5 ML SUBCUTANEOUS SOLUTION
SUBCUTANEOUS | 3 refills | Status: DC
Start: 2023-06-13 — End: 2023-06-17
  Filled 2023-06-13: qty 0.5, 84d supply, fill #0

## 2023-06-16 ENCOUNTER — Other Ambulatory Visit: Payer: Self-pay

## 2023-06-17 ENCOUNTER — Other Ambulatory Visit: Payer: Self-pay

## 2023-06-17 DIAGNOSIS — L400 Psoriasis vulgaris: Secondary | ICD-10-CM

## 2023-06-17 MED ORDER — USTEKINUMAB 45 MG/0.5 ML SUBCUTANEOUS SYRINGE
INJECTION | SUBCUTANEOUS | 1 refills | Status: DC
Start: 2023-06-17 — End: 2023-09-10
  Filled 2023-06-17: qty 0.5, 84d supply, fill #0
  Filled 2023-09-04: qty 0.5, 84d supply, fill #1

## 2023-06-18 ENCOUNTER — Other Ambulatory Visit: Payer: Self-pay

## 2023-06-20 ENCOUNTER — Other Ambulatory Visit: Payer: Self-pay

## 2023-06-23 ENCOUNTER — Other Ambulatory Visit: Payer: Self-pay

## 2023-06-27 ENCOUNTER — Other Ambulatory Visit: Payer: Self-pay

## 2023-06-29 ENCOUNTER — Other Ambulatory Visit: Payer: Self-pay

## 2023-07-09 ENCOUNTER — Other Ambulatory Visit: Payer: Self-pay

## 2023-09-04 ENCOUNTER — Other Ambulatory Visit: Payer: Self-pay

## 2023-09-04 MED ORDER — USTEKINUMAB-AEKN 45 MG/0.5 ML SUBCUTANEOUS SYRINGE
INJECTION | SUBCUTANEOUS | 0 refills | Status: DC
Start: 2023-09-04 — End: 2023-09-10
  Filled 2023-09-04: qty 0.5, 56d supply, fill #0

## 2023-09-05 ENCOUNTER — Other Ambulatory Visit: Payer: Self-pay

## 2023-09-09 ENCOUNTER — Other Ambulatory Visit: Payer: Self-pay

## 2023-09-10 ENCOUNTER — Other Ambulatory Visit: Payer: Self-pay

## 2023-09-10 MED ORDER — YESINTEK 45 MG/0.5 ML SUBCUTANEOUS SYRINGE
INJECTION | SUBCUTANEOUS | 0 refills | Status: AC
Start: 2023-09-10 — End: ?
  Filled 2023-09-10: qty 0.5, 84d supply, fill #0

## 2023-09-11 ENCOUNTER — Other Ambulatory Visit: Payer: Self-pay

## 2023-09-12 ENCOUNTER — Other Ambulatory Visit: Payer: Self-pay

## 2023-09-15 ENCOUNTER — Other Ambulatory Visit: Payer: Self-pay

## 2023-09-18 ENCOUNTER — Other Ambulatory Visit: Payer: Self-pay

## 2023-09-19 ENCOUNTER — Other Ambulatory Visit: Payer: Self-pay

## 2023-09-23 ENCOUNTER — Other Ambulatory Visit: Payer: Self-pay

## 2023-09-24 ENCOUNTER — Other Ambulatory Visit: Payer: Self-pay

## 2023-09-25 ENCOUNTER — Other Ambulatory Visit: Payer: Self-pay

## 2023-09-26 ENCOUNTER — Other Ambulatory Visit: Payer: Self-pay

## 2023-09-29 ENCOUNTER — Other Ambulatory Visit: Payer: Self-pay

## 2023-10-01 ENCOUNTER — Other Ambulatory Visit: Payer: Self-pay

## 2023-10-02 ENCOUNTER — Other Ambulatory Visit: Payer: Self-pay

## 2023-10-10 ENCOUNTER — Other Ambulatory Visit: Payer: Self-pay

## 2023-10-16 ENCOUNTER — Other Ambulatory Visit: Payer: Self-pay

## 2023-10-27 ENCOUNTER — Other Ambulatory Visit: Payer: Self-pay
